# Patient Record
Sex: Male | Born: 1965
Health system: Southern US, Community
[De-identification: ages and names within clinical notes are randomized; demographics above are authoritative.]

## PROBLEM LIST (undated history)

## (undated) DIAGNOSIS — I10 Essential (primary) hypertension: Secondary | ICD-10-CM

---

## 1990-09-21 HISTORY — PX: ELBOW ARTHROPLASTY: SHX928

## 2004-02-09 ENCOUNTER — Emergency Department (HOSPITAL_COMMUNITY): Admission: EM | Admit: 2004-02-09 | Discharge: 2004-02-09 | Payer: Self-pay | Admitting: Emergency Medicine

## 2007-07-06 ENCOUNTER — Encounter: Admission: RE | Admit: 2007-07-06 | Discharge: 2007-07-06 | Payer: Self-pay | Admitting: Internal Medicine

## 2009-04-22 IMAGING — CT CT HEAD WO/W CM
3 series · 17 of 30 positions shown, 19 images · IV contrast (omnipaque)
Comparison: None.

CLINICAL DATA: Left body numbness and weight loss.  
 HEAD CT WITHOUT AND WITH CONTRAST:
TECHNIQUE: Contiguous axial images were obtained from the base of the skull through the vertex according to standard protocol before and after administration of intravenous contrast.
 Contrast:   75 cc of Omnipaque 300.

[Series 3: head w/cm · axial · 0.49mm/px · z∈[+52,+184]mm · 8 of 32 slices shown, 10 images]
[im 4/32  brain]
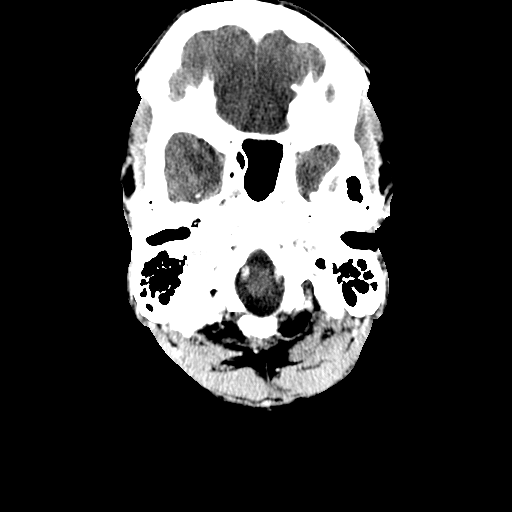
[im 4/32  bone]
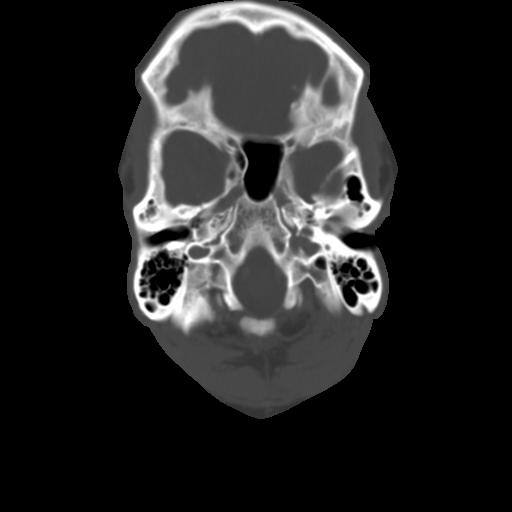
[im 7/32  brain]
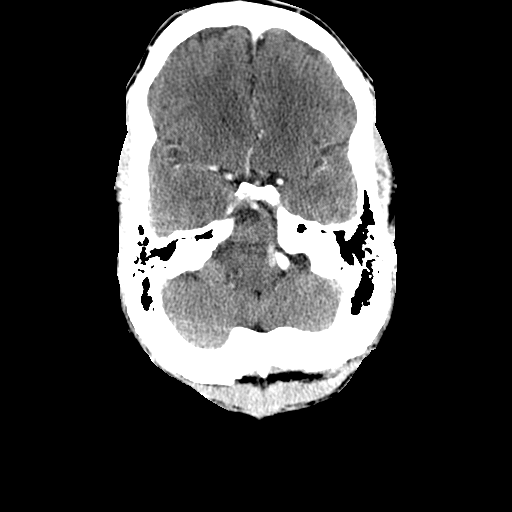
[im 11/32  brain]
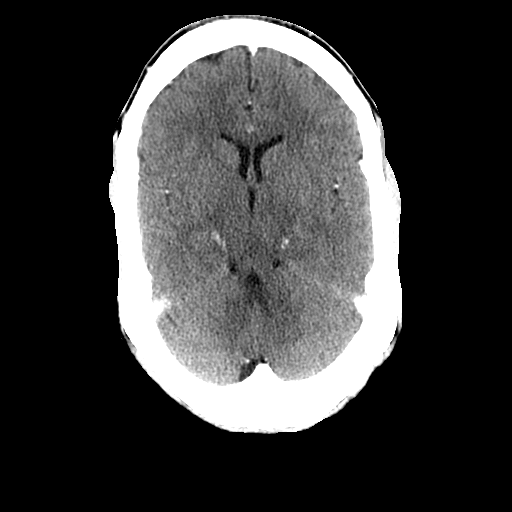
[im 14/32  brain]
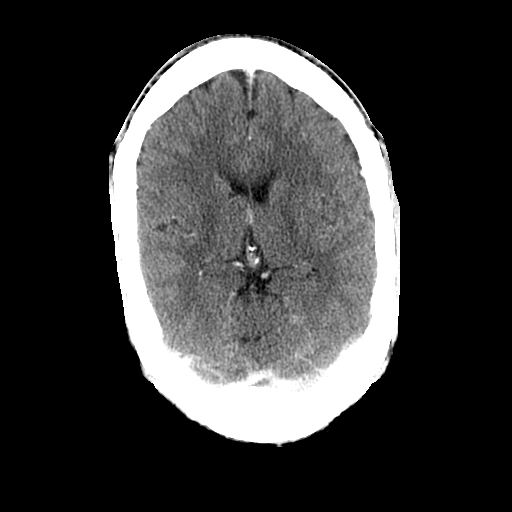
[im 18/32  brain]
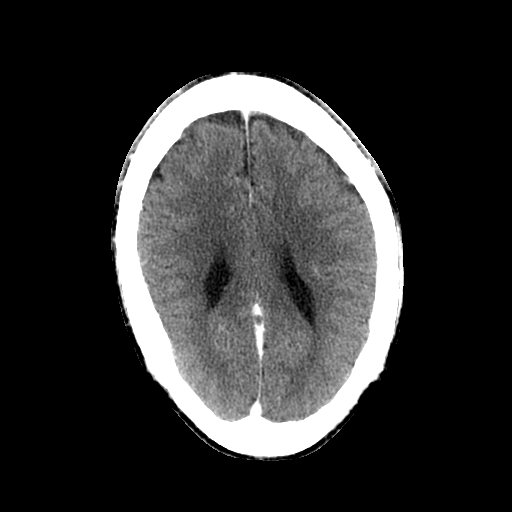
[im 18/32  bone]
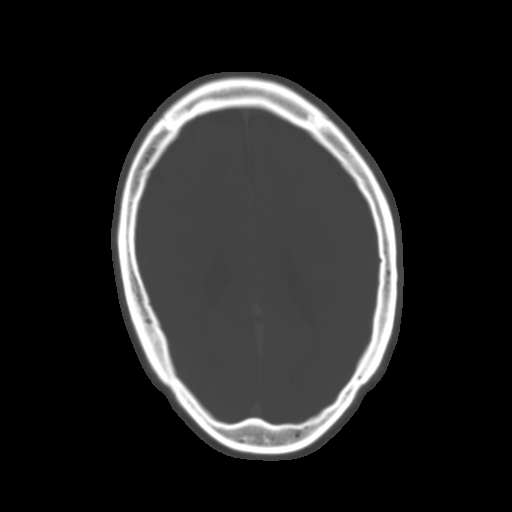
[im 21/32  brain]
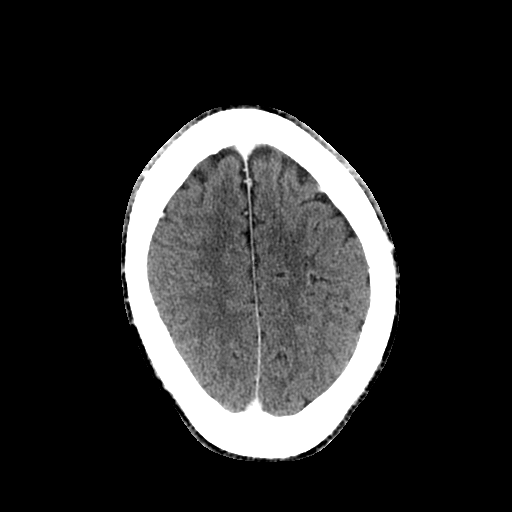
[im 25/32  brain]
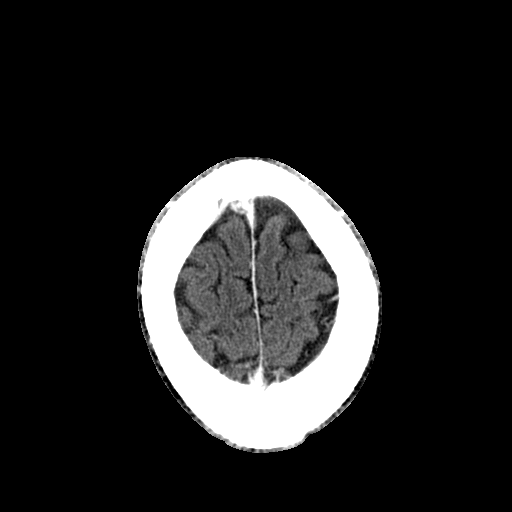
[im 28/32  brain]
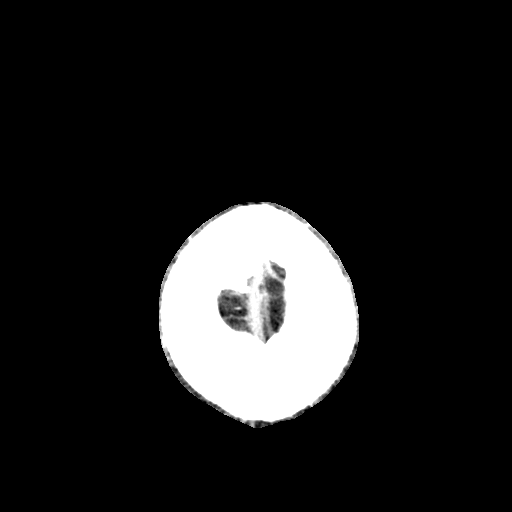

[Series 32: 3d filtered head · axial · 0.49mm/px · z∈[+52,+184]mm · 8 of 32 slices shown]
[im 4/32  brain]
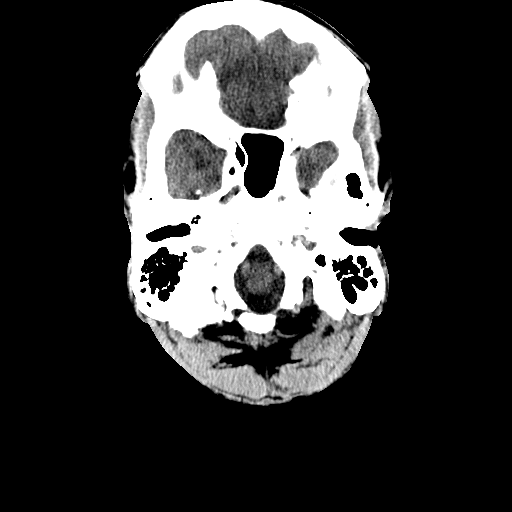
[im 7/32  brain]
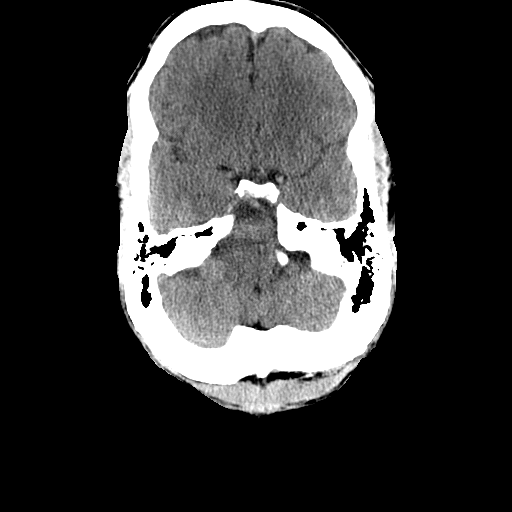
[im 11/32  brain]
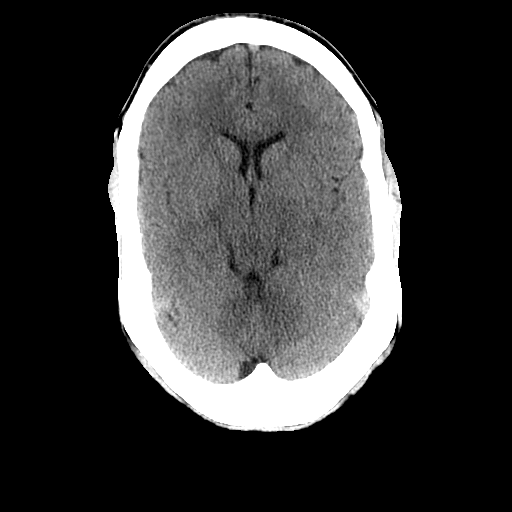
[im 14/32  brain]
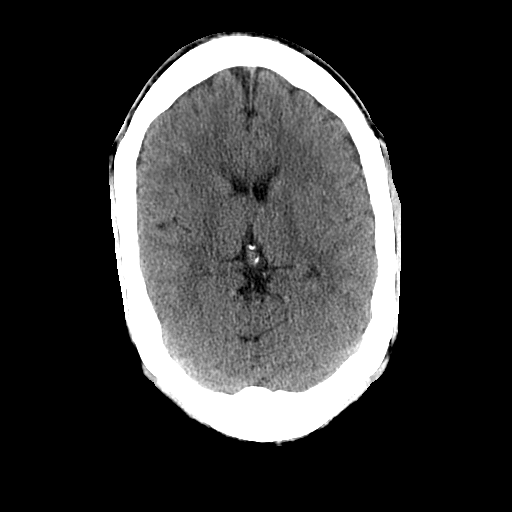
[im 18/32  brain]
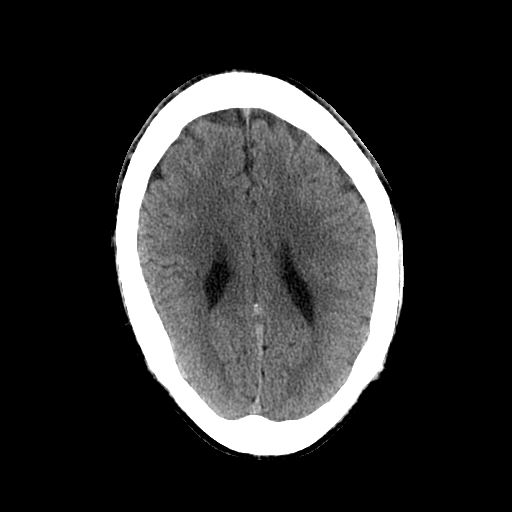
[im 21/32  brain]
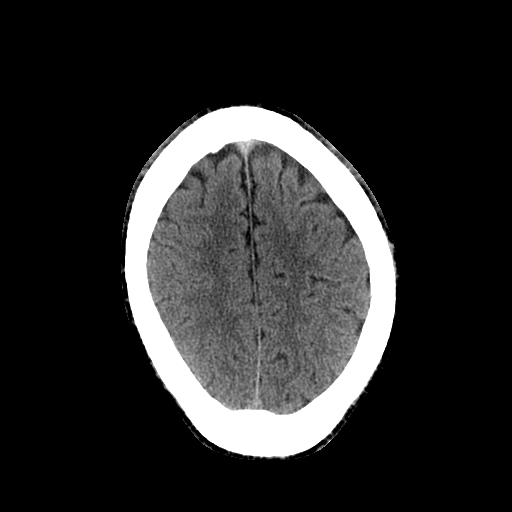
[im 25/32  brain]
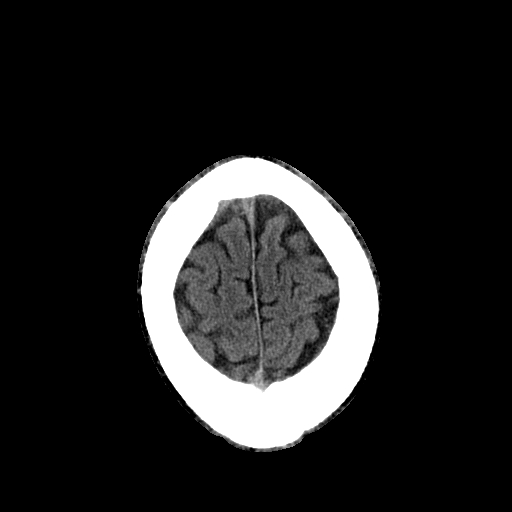
[im 28/32  brain]
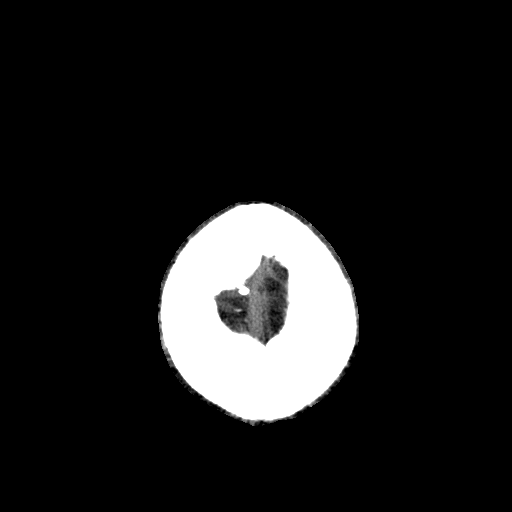

[Series 33: 3d filtered head w/cm · axial · 0.49mm/px · 1 of 32 slices shown]
[im 4/32  brain]
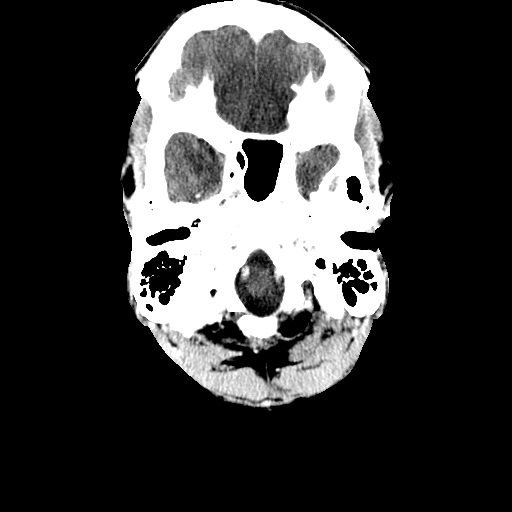

[17 of 30 positions shown; findings below may reference images not displayed]

FINDINGS: Precontrast images demonstrate evidence for acute intracranial abnormality.  Specifically, there is no evidence for acute infarct, hemorrhage, hydrocephalus, or extraaxial fluid collection.  Post contrast images demonstrate no areas of pathologic enhancement.  
 The paranasal sinuses and mastoid air cells are clear.
IMPRESSION: 1.  No acute intracranial abnormality.
 2.  If patient's symptoms persist, MRI may be of use for further evaluation, as clinically indicated.

## 2013-01-17 ENCOUNTER — Other Ambulatory Visit: Payer: Self-pay | Admitting: Orthopedic Surgery

## 2013-03-01 ENCOUNTER — Encounter (HOSPITAL_BASED_OUTPATIENT_CLINIC_OR_DEPARTMENT_OTHER): Payer: Self-pay | Admitting: *Deleted

## 2013-03-01 NOTE — Progress Notes (Signed)
Had surgery on this elbow 1992-dr kuzma did it then-will come in for ekg-bmet-

## 2013-03-03 ENCOUNTER — Encounter (HOSPITAL_BASED_OUTPATIENT_CLINIC_OR_DEPARTMENT_OTHER)
Admission: RE | Admit: 2013-03-03 | Discharge: 2013-03-03 | Disposition: A | Discharge status: Home / Self Care | Payer: Managed Care, Other (non HMO) | Source: Ambulatory Visit | Attending: Orthopedic Surgery | Admitting: Orthopedic Surgery

## 2013-03-03 ENCOUNTER — Other Ambulatory Visit: Payer: Self-pay

## 2013-03-03 LAB — BASIC METABOLIC PANEL
BUN: 15 mg/dL (ref 6–23)
CO2: 29 mEq/L (ref 19–32)
Chloride: 103 mEq/L (ref 96–112)
Creatinine, Ser: 1.44 mg/dL — ABNORMAL HIGH (ref 0.50–1.35)
Glucose, Bld: 110 mg/dL — ABNORMAL HIGH (ref 70–99)
Potassium: 3.4 mEq/L — ABNORMAL LOW (ref 3.5–5.1)

## 2013-03-07 ENCOUNTER — Encounter (HOSPITAL_BASED_OUTPATIENT_CLINIC_OR_DEPARTMENT_OTHER): Payer: Self-pay | Admitting: Orthopedic Surgery

## 2013-03-07 ENCOUNTER — Ambulatory Visit (HOSPITAL_BASED_OUTPATIENT_CLINIC_OR_DEPARTMENT_OTHER): Payer: Managed Care, Other (non HMO) | Admitting: Anesthesiology

## 2013-03-07 ENCOUNTER — Ambulatory Visit (HOSPITAL_BASED_OUTPATIENT_CLINIC_OR_DEPARTMENT_OTHER)
Admission: RE | Admit: 2013-03-07 | Discharge: 2013-03-07 | Disposition: A | Discharge status: Home / Self Care | Payer: Managed Care, Other (non HMO) | Source: Ambulatory Visit | Attending: Orthopedic Surgery | Admitting: Orthopedic Surgery

## 2013-03-07 ENCOUNTER — Encounter (HOSPITAL_BASED_OUTPATIENT_CLINIC_OR_DEPARTMENT_OTHER): Payer: Self-pay | Admitting: Anesthesiology

## 2013-03-07 ENCOUNTER — Encounter (HOSPITAL_BASED_OUTPATIENT_CLINIC_OR_DEPARTMENT_OTHER)
Admission: RE | Disposition: A | Discharge status: Home / Self Care | Payer: Self-pay | Source: Ambulatory Visit | Attending: Orthopedic Surgery

## 2013-03-07 DIAGNOSIS — L905 Scar conditions and fibrosis of skin: Secondary | ICD-10-CM | POA: Insufficient documentation

## 2013-03-07 DIAGNOSIS — T8489XA Other specified complication of internal orthopedic prosthetic devices, implants and grafts, initial encounter: Secondary | ICD-10-CM | POA: Insufficient documentation

## 2013-03-07 DIAGNOSIS — M25529 Pain in unspecified elbow: Secondary | ICD-10-CM | POA: Insufficient documentation

## 2013-03-07 DIAGNOSIS — M24029 Loose body in unspecified elbow: Secondary | ICD-10-CM | POA: Insufficient documentation

## 2013-03-07 DIAGNOSIS — Y831 Surgical operation with implant of artificial internal device as the cause of abnormal reaction of the patient, or of later complication, without mention of misadventure at the time of the procedure: Secondary | ICD-10-CM | POA: Insufficient documentation

## 2013-03-07 HISTORY — DX: Essential (primary) hypertension: I10

## 2013-03-07 HISTORY — PX: ELBOW ARTHROSCOPY: SHX614

## 2013-03-07 HISTORY — PX: EXTREMITY WIRE/PIN REMOVAL: SHX5051

## 2013-03-07 SURGERY — ARTHROSCOPY, ELBOW
Anesthesia: General | Site: Finger | Laterality: Left | Wound class: Clean

## 2013-03-07 MED ORDER — BUPIVACAINE-EPINEPHRINE PF 0.5-1:200000 % IJ SOLN
INTRAMUSCULAR | Status: DC | PRN
  Administered 2013-03-07: 25 mL

## 2013-03-07 MED ORDER — OXYCODONE HCL 5 MG/5ML PO SOLN: 5.0000 mg | Freq: Once | ORAL | Status: DC | PRN

## 2013-03-07 MED ORDER — CHLORHEXIDINE GLUCONATE 4 % EX LIQD: 60.0000 mL | Freq: Once | CUTANEOUS | Status: DC

## 2013-03-07 MED ORDER — OXYCODONE HCL 5 MG PO TABS: 5.0000 mg | ORAL_TABLET | Freq: Once | ORAL | Status: DC | PRN

## 2013-03-07 MED ORDER — DEXAMETHASONE SODIUM PHOSPHATE 4 MG/ML IJ SOLN
INTRAMUSCULAR | Status: DC | PRN
  Administered 2013-03-07: 10 mg via INTRAVENOUS

## 2013-03-07 MED ORDER — ONDANSETRON HCL 4 MG/2ML IJ SOLN: 4.0000 mg | Freq: Once | INTRAMUSCULAR | Status: DC | PRN

## 2013-03-07 MED ORDER — LIDOCAINE HCL (CARDIAC) 20 MG/ML IV SOLN
INTRAVENOUS | Status: DC | PRN
  Administered 2013-03-07: 100 mg via INTRAVENOUS

## 2013-03-07 MED ORDER — SODIUM CHLORIDE 0.9 % IR SOLN
Status: DC | PRN
  Administered 2013-03-07: 2500 mL

## 2013-03-07 MED ORDER — HYDROMORPHONE HCL PF 1 MG/ML IJ SOLN: 0.2500 mg | INTRAMUSCULAR | Status: DC | PRN

## 2013-03-07 MED ORDER — FENTANYL CITRATE 0.05 MG/ML IJ SOLN
INTRAMUSCULAR | Status: DC | PRN
  Administered 2013-03-07: 25 ug via INTRAVENOUS

## 2013-03-07 MED ORDER — CEFAZOLIN SODIUM-DEXTROSE 2-3 GM-% IV SOLR
2.0000 g | INTRAVENOUS | Status: AC
  Administered 2013-03-07: 2 g via INTRAVENOUS

## 2013-03-07 MED ORDER — LACTATED RINGERS IV SOLN
INTRAVENOUS | Status: DC
  Administered 2013-03-07 (×3): via INTRAVENOUS

## 2013-03-07 MED ORDER — LACTATED RINGERS IV SOLN: INTRAVENOUS | Status: DC

## 2013-03-07 MED ORDER — OXYCODONE-ACETAMINOPHEN 10-325 MG PO TABS: 1.0000 | ORAL_TABLET | ORAL | Status: AC | PRN

## 2013-03-07 MED ORDER — FENTANYL CITRATE 0.05 MG/ML IJ SOLN
50.0000 ug | INTRAMUSCULAR | Status: DC | PRN
  Administered 2013-03-07: 100 ug via INTRAVENOUS

## 2013-03-07 MED ORDER — PROPOFOL 10 MG/ML IV BOLUS
INTRAVENOUS | Status: DC | PRN
  Administered 2013-03-07: 250 mg via INTRAVENOUS

## 2013-03-07 MED ORDER — SUCCINYLCHOLINE CHLORIDE 20 MG/ML IJ SOLN
INTRAMUSCULAR | Status: DC | PRN
  Administered 2013-03-07: 100 mg via INTRAVENOUS

## 2013-03-07 MED ORDER — MIDAZOLAM HCL 2 MG/2ML IJ SOLN
1.0000 mg | INTRAMUSCULAR | Status: DC | PRN
  Administered 2013-03-07: 2 mg via INTRAVENOUS

## 2013-03-07 MED ORDER — ONDANSETRON HCL 4 MG/2ML IJ SOLN
INTRAMUSCULAR | Status: DC | PRN
  Administered 2013-03-07: 4 mg via INTRAVENOUS

## 2013-03-07 SURGICAL SUPPLY — 89 items
BANDAGE COBAN STERILE 2 (GAUZE/BANDAGES/DRESSINGS) IMPLANT
BANDAGE GAUZE ELAST BULKY 4 IN (GAUZE/BANDAGES/DRESSINGS) ×3 IMPLANT
BLADE CUTTER GATOR 3.5 (BLADE) IMPLANT
BLADE GREAT WHITE 4.2 (BLADE) IMPLANT
BLADE MINI RND TIP GREEN BEAV (BLADE) ×2 IMPLANT
BLADE SURG 15 STRL LF DISP TIS (BLADE) ×2 IMPLANT
BLADE SURG 15 STRL SS (BLADE) ×6
BNDG CMPR 9X4 STRL LF SNTH (GAUZE/BANDAGES/DRESSINGS) ×2
BNDG COHESIVE 1X5 TAN STRL LF (GAUZE/BANDAGES/DRESSINGS) IMPLANT
BNDG COHESIVE 3X5 TAN STRL LF (GAUZE/BANDAGES/DRESSINGS) ×6 IMPLANT
BNDG ESMARK 4X9 LF (GAUZE/BANDAGES/DRESSINGS) ×2 IMPLANT
BUR CUDA 2.9 (BURR) IMPLANT
BUR FULL RADIUS 2.9 (BURR) ×1 IMPLANT
BUR GATOR 2.9 (BURR) IMPLANT
BUR OVAL 4.0 (BURR) IMPLANT
BUR SPHERICAL 2.9 (BURR) IMPLANT
CANISTER OMNI JUG 16 LITER (MISCELLANEOUS) ×3 IMPLANT
CANISTER SUCTION 2500CC (MISCELLANEOUS) IMPLANT
CHLORAPREP W/TINT 26ML (MISCELLANEOUS) ×3 IMPLANT
CLOTH BEACON ORANGE TIMEOUT ST (SAFETY) ×3 IMPLANT
CORDS BIPOLAR (ELECTRODE) ×3 IMPLANT
COVER MAYO STAND STRL (DRAPES) ×2 IMPLANT
COVER TABLE BACK 60X90 (DRAPES) ×1 IMPLANT
CUFF TOURNIQUET SINGLE 18IN (TOURNIQUET CUFF) ×1 IMPLANT
DECANTER SPIKE VIAL GLASS SM (MISCELLANEOUS) IMPLANT
DRAIN PENROSE 1/2X12 LTX STRL (WOUND CARE) IMPLANT
DRAPE ARTHROSCOPY W/POUCH 114 (DRAPES) ×3 IMPLANT
DRAPE EXTREMITY T 121X128X90 (DRAPE) ×2 IMPLANT
DRAPE OEC MINIVIEW 54X84 (DRAPES) ×2 IMPLANT
DRAPE SURG 17X23 STRL (DRAPES) ×5 IMPLANT
DRSG KUZMA FLUFF (GAUZE/BANDAGES/DRESSINGS) ×2 IMPLANT
ELECT REM PT RETURN 9FT ADLT (ELECTROSURGICAL)
ELECTRODE REM PT RTRN 9FT ADLT (ELECTROSURGICAL) IMPLANT
GAUZE XEROFORM 1X8 LF (GAUZE/BANDAGES/DRESSINGS) ×3 IMPLANT
GLOVE BIO SURGEON STRL SZ 6.5 (GLOVE) ×5 IMPLANT
GLOVE BIO SURGEON STRL SZ7.5 (GLOVE) ×3 IMPLANT
GLOVE BIO SURGEON STRL SZ8 (GLOVE) ×1 IMPLANT
GLOVE BIOGEL PI IND STRL 7.0 (GLOVE) ×3 IMPLANT
GLOVE BIOGEL PI IND STRL 8 (GLOVE) ×2 IMPLANT
GLOVE BIOGEL PI IND STRL 8.5 (GLOVE) ×2 IMPLANT
GLOVE BIOGEL PI INDICATOR 7.0 (GLOVE) ×3
GLOVE BIOGEL PI INDICATOR 8 (GLOVE) ×1
GLOVE BIOGEL PI INDICATOR 8.5 (GLOVE) ×1
GLOVE EPREMIER NITRL EXT CFF L (GLOVE) IMPLANT
GLOVE EXAM NITRILE EXT CFF LRG (GLOVE) ×3 IMPLANT
GLOVE SURG ORTHO 8.0 STRL STRW (GLOVE) ×3 IMPLANT
GOWN BRE IMP PREV XXLGXLNG (GOWN DISPOSABLE) ×4 IMPLANT
GOWN PREVENTION PLUS XLARGE (GOWN DISPOSABLE) ×9 IMPLANT
NDL SAFETY ECLIPSE 18X1.5 (NEEDLE) ×2 IMPLANT
NEEDLE 27GAX1X1/2 (NEEDLE) IMPLANT
NEEDLE HYPO 18GX1.5 SHARP (NEEDLE)
NEEDLE HYPO 22GX1.5 SAFETY (NEEDLE) IMPLANT
NS IRRIG 1000ML POUR BTL (IV SOLUTION) ×1 IMPLANT
PACK ARTHROSCOPY DSU (CUSTOM PROCEDURE TRAY) ×3 IMPLANT
PACK BASIN DAY SURGERY FS (CUSTOM PROCEDURE TRAY) ×3 IMPLANT
PAD CAST 3X4 CTTN HI CHSV (CAST SUPPLIES) ×4 IMPLANT
PADDING CAST ABS 3INX4YD NS (CAST SUPPLIES)
PADDING CAST ABS 4INX4YD NS (CAST SUPPLIES)
PADDING CAST ABS COTTON 3X4 (CAST SUPPLIES) IMPLANT
PADDING CAST ABS COTTON 4X4 ST (CAST SUPPLIES) ×2 IMPLANT
PADDING CAST COTTON 3X4 STRL (CAST SUPPLIES) ×3
PENCIL BUTTON HOLSTER BLD 10FT (ELECTRODE) IMPLANT
RESECTOR FULL RADIUS 4.2MM (BLADE) IMPLANT
SET ARTHROSCOPY TUBING (MISCELLANEOUS) ×3
SET ARTHROSCOPY TUBING LN (MISCELLANEOUS) ×2 IMPLANT
SHEET MEDIUM DRAPE 40X70 STRL (DRAPES) ×1 IMPLANT
SLEEVE SCD COMPRESS KNEE MED (MISCELLANEOUS) ×3 IMPLANT
SLING ARM FOAM STRAP XLG (SOFTGOODS) ×1 IMPLANT
SPLINT FINGER 5/8X3.25 (SOFTGOODS) IMPLANT
SPLINT FINGER FOAM 3 9119 05 (SOFTGOODS) ×3
SPLINT PLASTER CAST XFAST 3X15 (CAST SUPPLIES) ×60 IMPLANT
SPLINT PLASTER XTRA FASTSET 3X (CAST SUPPLIES)
SPONGE GAUZE 4X4 12PLY (GAUZE/BANDAGES/DRESSINGS) ×3 IMPLANT
STOCKINETTE 4X48 STRL (DRAPES) ×3 IMPLANT
SUT ETHILON 5 0 P 3 18 (SUTURE)
SUT ETHILON 5 0 PS 2 18 (SUTURE) IMPLANT
SUT NYLON ETHILON 5-0 P-3 1X18 (SUTURE) IMPLANT
SUT VIC AB 2-0 SH 27 (SUTURE)
SUT VIC AB 2-0 SH 27XBRD (SUTURE) IMPLANT
SUT VIC AB 4-0 P2 18 (SUTURE) IMPLANT
SUT VICRYL RAPID 5 0 P 3 (SUTURE) IMPLANT
SUT VICRYL RAPIDE 4/0 PS 2 (SUTURE) ×4 IMPLANT
SYR BULB 3OZ (MISCELLANEOUS) ×2 IMPLANT
SYR CONTROL 10ML LL (SYRINGE) IMPLANT
TOWEL OR 17X24 6PK STRL BLUE (TOWEL DISPOSABLE) ×6 IMPLANT
TOWEL OR NON WOVEN STRL DISP B (DISPOSABLE) ×1 IMPLANT
UNDERPAD 30X30 INCONTINENT (UNDERPADS AND DIAPERS) ×3 IMPLANT
WAND STAR VAC 90 (SURGICAL WAND) IMPLANT
WATER STERILE IRR 1000ML POUR (IV SOLUTION) ×3 IMPLANT

## 2013-03-07 NOTE — Anesthesia Preprocedure Evaluation (Addendum)

## 2013-03-07 NOTE — Op Note (Signed)
Dictation Number [id number]

## 2013-03-07 NOTE — Transfer of Care (Signed)
Immediate Anesthesia Transfer of Care Note  Patient: [name].  Procedure(s) Performed: Procedure(s): ARTHROSCOPY LEFT ELBOW WITH DEBRIDEMENT AND REMOVAL OF LOOSE BODIES (Left) REMOVAL PINS/WIRE OLECRANON (Left) EXCISION MASS LEFT SMALL FINGER (Left)  Patient Location: PACU  Anesthesia Type:GA combined with regional for post-op pain  Level of Consciousness: sedated  Airway & Oxygen Therapy: Patient Spontanous Breathing and Patient connected to face mask oxygen  Post-op Assessment: Report given to PACU RN and Post -op Vital signs reviewed and stable  Post vital signs: Reviewed and stable  Complications: No apparent anesthesia complications

## 2013-03-07 NOTE — Anesthesia Postprocedure Evaluation (Signed)
  Anesthesia Post-op Note  Patient: [name].  Procedure(s) Performed: Procedure(s): ARTHROSCOPY LEFT ELBOW WITH DEBRIDEMENT AND REMOVAL OF LOOSE BODIES (Left) REMOVAL PINS/WIRE OLECRANON (Left) EXCISION MASS LEFT SMALL FINGER (Left)  Patient Location: PACU  Anesthesia Type:GA combined with regional for post-op pain  Level of Consciousness: sedated  Airway and Oxygen Therapy: Patient Spontanous Breathing and Patient connected to face mask oxygen  Post-op Pain: none  Post-op Assessment: Post-op Vital signs reviewed  Post-op Vital Signs: Reviewed  Complications: No apparent anesthesia complications
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
## 2013-03-07 NOTE — Progress Notes (Signed)
  Assisted Dr. Crews with left, ultrasound guided, supraclavicular block. Side rails up, monitors on throughout procedure. See vital signs in flow sheet. Tolerated Procedure well. 

## 2013-03-07 NOTE — Anesthesia Procedure Notes (Addendum)
Anesthesia Regional Block:  Supraclavicular block  Pre-Anesthetic Checklist: ,, timeout performed, Correct Patient, Correct Site, Correct Laterality, Correct Procedure, Correct Position, site marked, Risks and benefits discussed,  Surgical consent,  Pre-op evaluation,  At surgeon's request and post-op pain management  Laterality: Left and Upper  Prep: chloraprep       Needles:  Injection technique: Single-shot  Needle Type: Echogenic Needle     Needle Length: 5cm 5 cm Needle Gauge: 21    Additional Needles:  Procedures: ultrasound guided (picture in chart) Supraclavicular block Narrative:  Start time: 03/07/2013 9:15 AM End time: 03/07/2013 9:22 AM Injection made incrementally with aspirations every 5 mL.  Performed by: Personally  Anesthesiologist: Sheldon Silvan  Supraclavicular block Procedure Name: Intubation Date/Time: 03/07/2013 10:57 AM Performed by: Gar Gibbon Pre-anesthesia Checklist: Patient identified, Emergency Drugs available, Suction available and Patient being monitored Patient Re-evaluated:Patient Re-evaluated prior to inductionOxygen Delivery Method: Circle System Utilized Preoxygenation: Pre-oxygenation with 100% oxygen Intubation Type: IV induction Ventilation: Mask ventilation without difficulty Laryngoscope Size: Miller and 3 Grade View: Grade III Tube type: Oral Tube size: 8.0 mm Number of attempts: 1 Airway Equipment and Method: stylet and oral airway Placement Confirmation: ETT inserted through vocal cords under direct vision,  positive ETCO2 and breath sounds checked- equal and bilateral Secured at: 24 cm Tube secured with: Tape Dental Injury: Teeth and Oropharynx as per pre-operative assessment

## 2013-03-07 NOTE — Brief Op Note (Signed)
03/07/2013  1:06 PM  PATIENT:  [name].  47 y.o. male  PRE-OPERATIVE DIAGNOSIS:  STATUS/POST  OLECRANON OPEN REDUCTION INTERNAL FIXATION, LOOSE BODIES LEFT ELBOW, MASS LEFT SMALL FINGER  POST-OPERATIVE DIAGNOSIS:  STATUS/POST  OLECRANON OPEN REDUCTION INTERNAL FIXATION, LOOSE BODIES LEFT ELBOW, MASS LEFT SMALL FINGER  PROCEDURE:  Procedure(s): ARTHROSCOPY LEFT ELBOW WITH DEBRIDEMENT AND REMOVAL OF LOOSE BODIES (Left) REMOVAL PINS/WIRE OLECRANON (Left) EXCISION MASS LEFT SMALL FINGER (Left)  SURGEON:  Surgeon(s) and Role:    * Nicki Reaper, MD - Primary    * Tami Ribas, MD - Assisting  PHYSICIAN ASSISTANT:   ASSISTANTS: Karlyn Agee, MD   ANESTHESIA:   regional and general  EBL:  Total I/O In: 1000 [I.V.:1000] Out: -   BLOOD ADMINISTERED:none  DRAINS: none   LOCAL MEDICATIONS USED:  NONE  SPECIMEN:  Excision  DISPOSITION OF SPECIMEN:  PATHOLOGY  COUNTS:  YES  TOURNIQUET:   Total Tourniquet Time Documented: Upper Arm (Left) - 36 minutes Total: Upper Arm (Left) - 36 minutes   DICTATION: .Other Dictation: Dictation Number [id number]  PLAN OF CARE: Discharge to home after PACU  PATIENT DISPOSITION:  PACU - hemodynamically stable.

## 2013-03-08 ENCOUNTER — Encounter (HOSPITAL_BASED_OUTPATIENT_CLINIC_OR_DEPARTMENT_OTHER): Payer: Self-pay | Admitting: Orthopedic Surgery
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
## 2014-12-19 ENCOUNTER — Other Ambulatory Visit: Payer: Self-pay | Admitting: Internal Medicine

## 2014-12-19 DIAGNOSIS — N183 Chronic kidney disease, stage 3 unspecified: Secondary | ICD-10-CM

## 2014-12-25 ENCOUNTER — Ambulatory Visit
Admission: RE | Admit: 2014-12-25 | Discharge: 2014-12-25 | Disposition: A | Discharge status: Home / Self Care | Payer: Managed Care, Other (non HMO) | Source: Ambulatory Visit | Attending: Internal Medicine | Admitting: Internal Medicine

## 2014-12-25 DIAGNOSIS — N183 Chronic kidney disease, stage 3 unspecified: Secondary | ICD-10-CM

## 2016-10-11 IMAGING — US US RENAL
1 series · 14 of 25 positions shown · non-contrast
Comparison: None.

CLINICAL DATA: Stage 3 chronic kidney disease

EXAM:
RENAL/URINARY TRACT ULTRASOUND COMPLETE

[Series 1: us renal · 0.28mm/px · 14 of 33 slices shown]
[im 1/33]
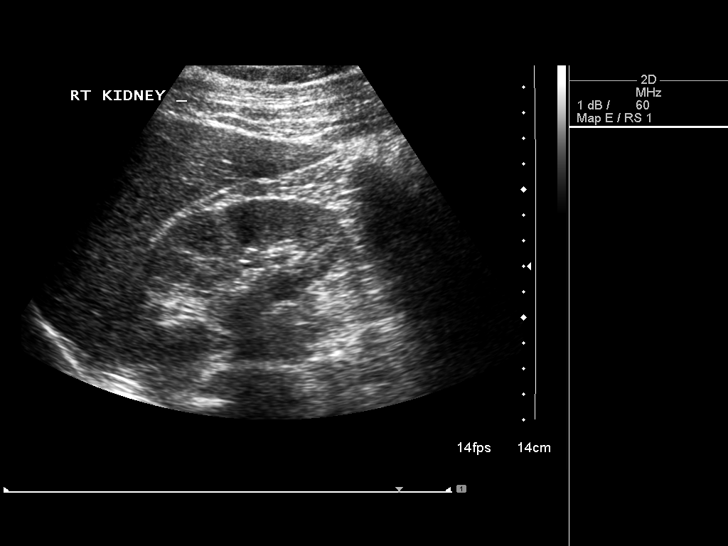
[im 3/33]
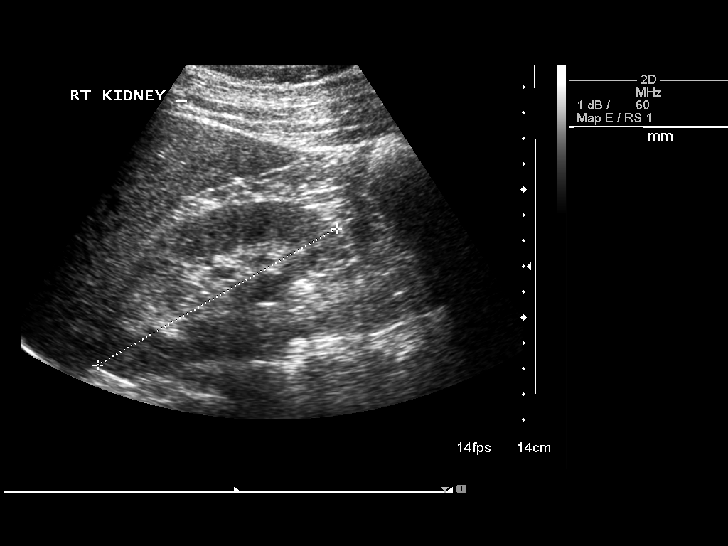
[im 6/33]
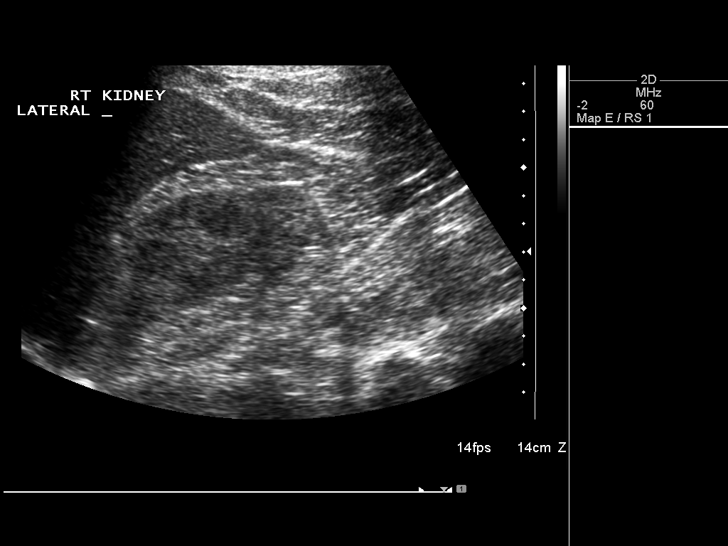
[im 9/33]
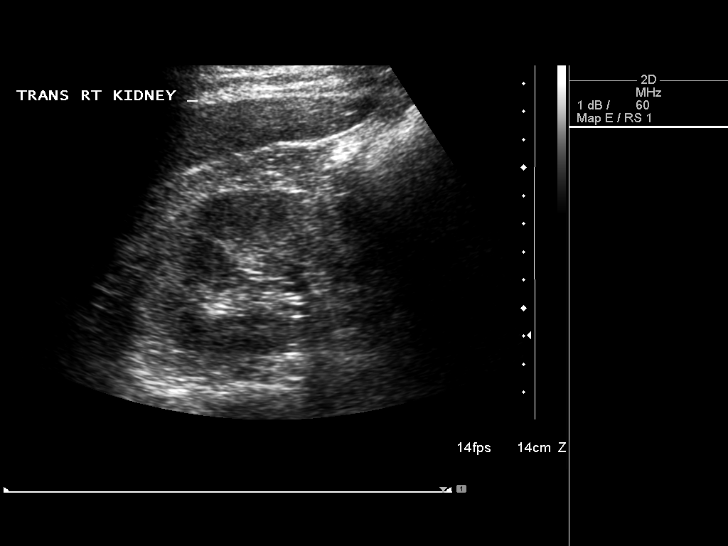
[im 11/33]
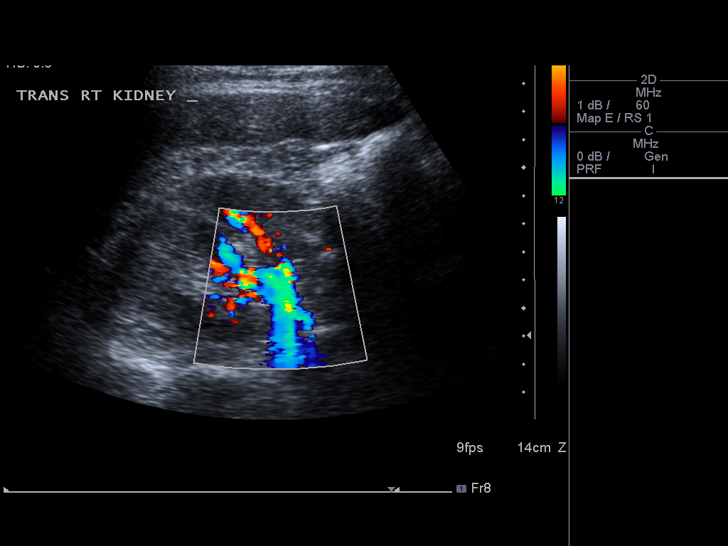
[im 13/33]
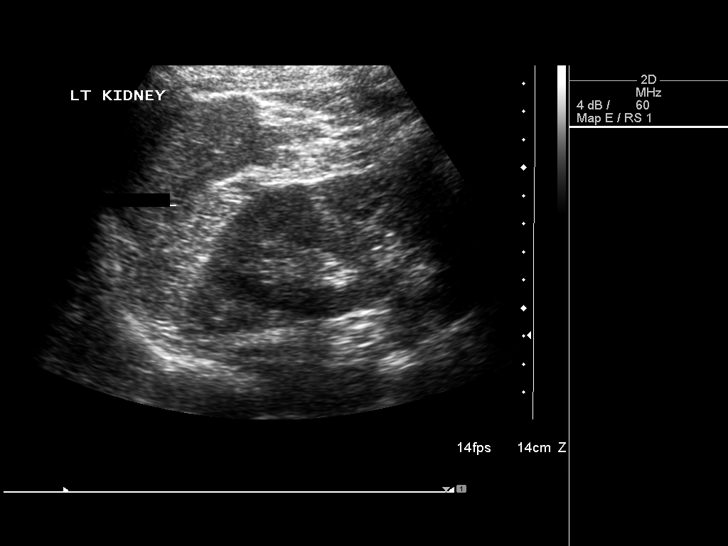
[im 15/33]
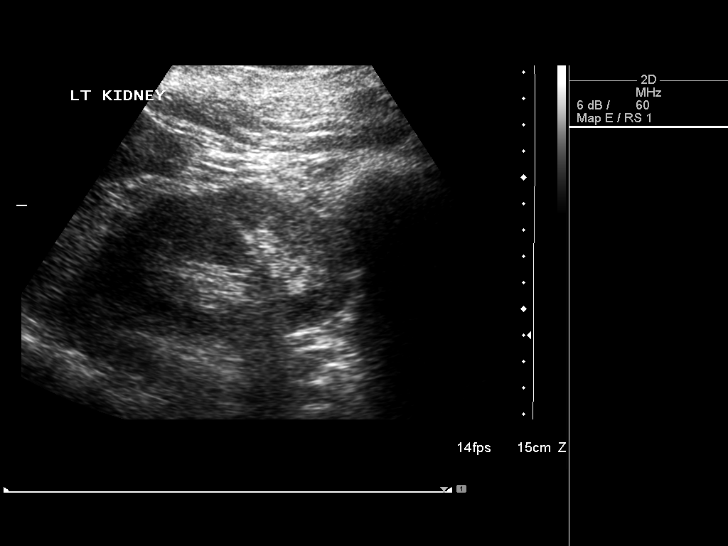
[im 18/33]
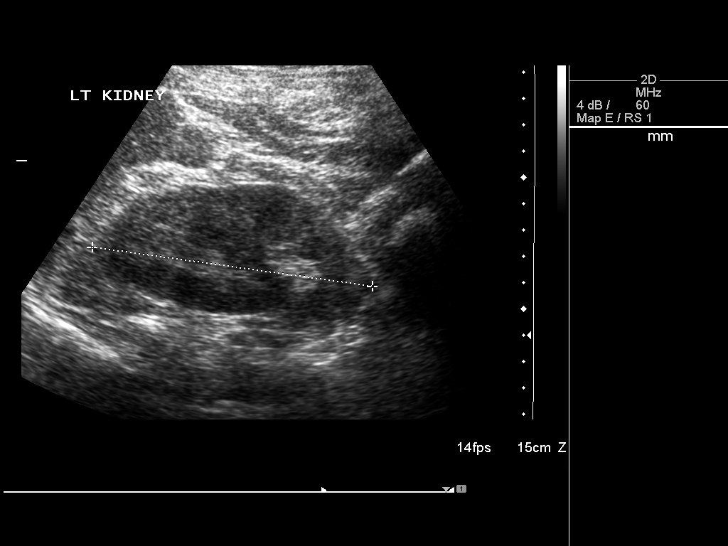
[im 21/33]
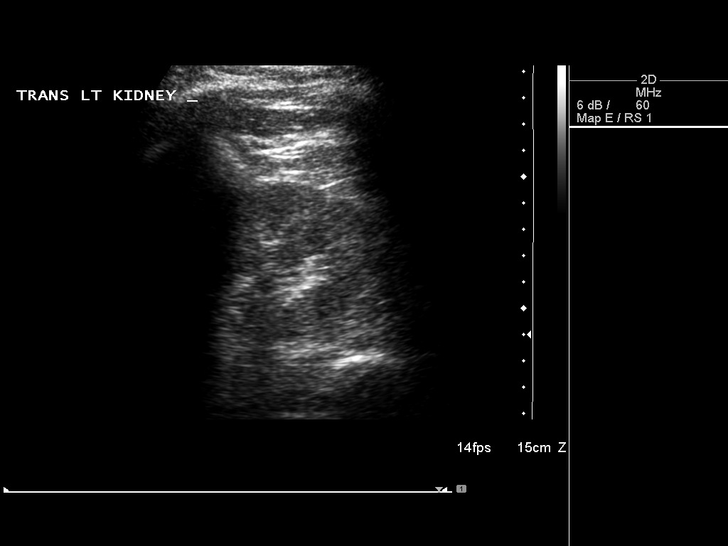
[im 22/33]
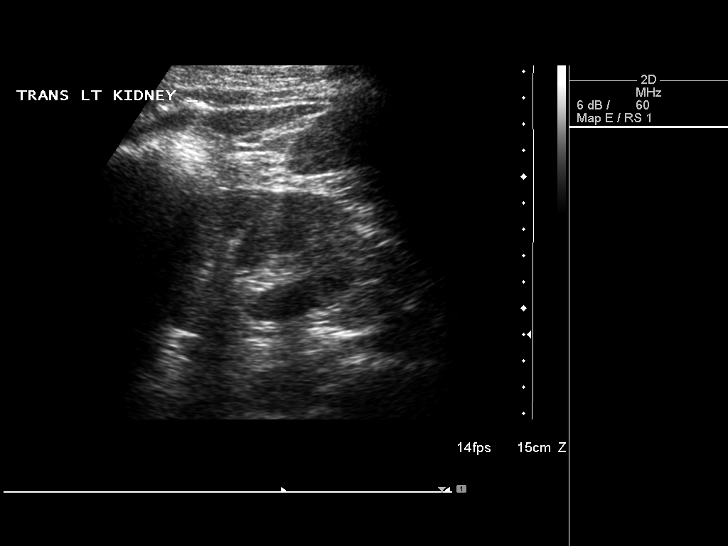
[im 25/33]
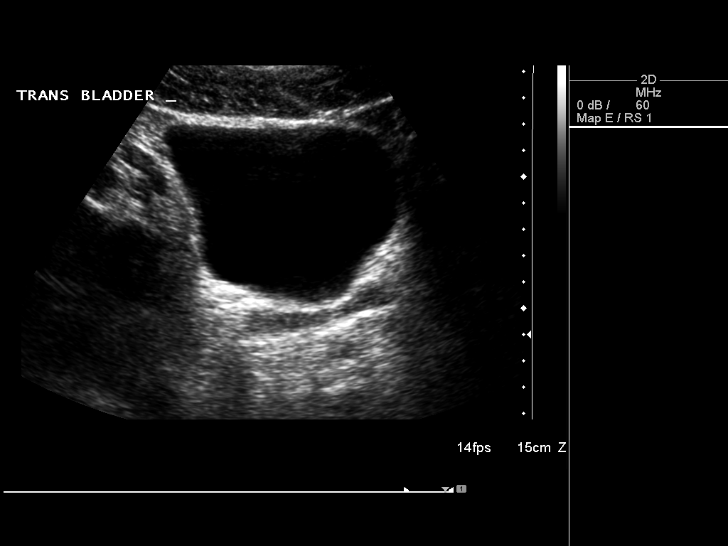
[im 27/33]
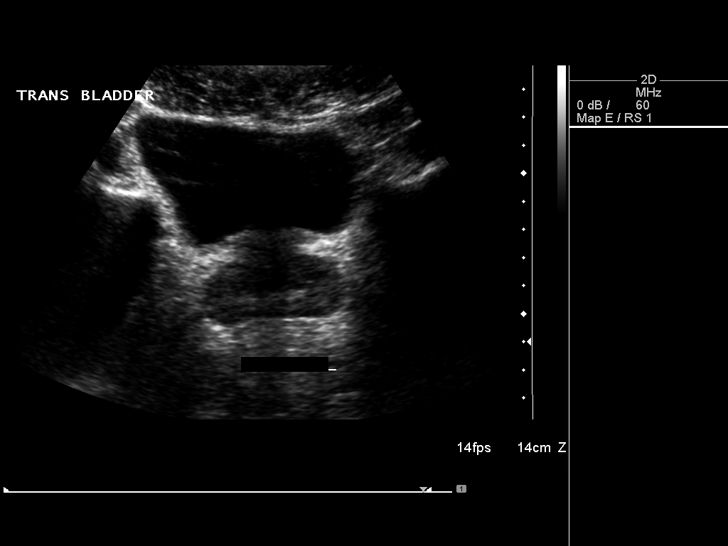
[im 30/33]
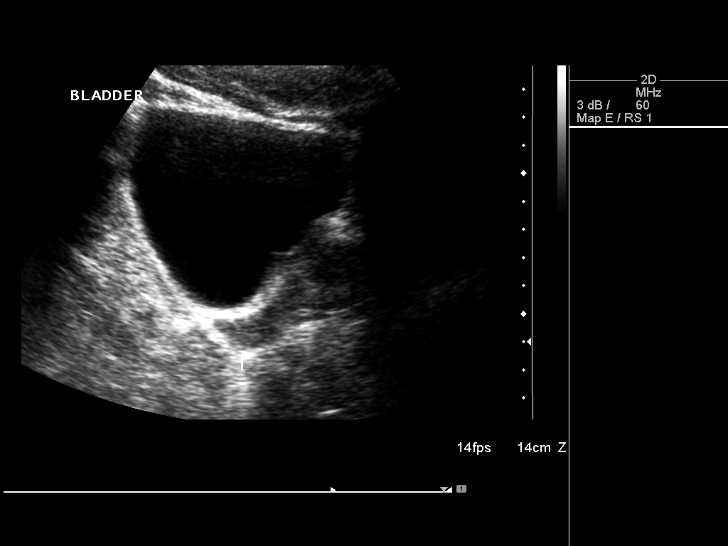
[im 33/33]
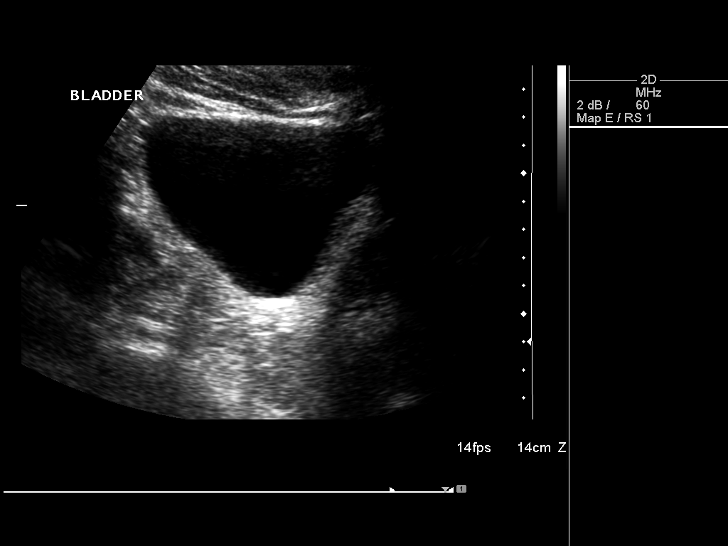

[14 of 25 positions shown; findings below may reference images not displayed]

FINDINGS: Right Kidney:

Length: 10.8 cm. Echogenicity within normal limits. No mass or
hydronephrosis visualized.

Left Kidney:

Length: 10.8 cm. Echogenicity within normal limits. No mass or
hydronephrosis visualized.

Bladder:

The bladder appears normal.

The prostate is prominent at 5.1 cm. The prostate causes impression
upon the bladder base. There is some nodularity to the interface
between the bladder in the prostate.
IMPRESSION: The kidneys are normal. Correlate appearance of the prostate with
PSA levels.

## 2017-07-30 ENCOUNTER — Ambulatory Visit: Payer: Self-pay | Admitting: Podiatry

## 2017-08-23 DIAGNOSIS — I10 Essential (primary) hypertension: Secondary | ICD-10-CM | POA: Diagnosis not present

## 2017-08-23 DIAGNOSIS — Z23 Encounter for immunization: Secondary | ICD-10-CM | POA: Diagnosis not present

## 2017-08-23 DIAGNOSIS — B351 Tinea unguium: Secondary | ICD-10-CM | POA: Diagnosis not present

## 2017-08-23 DIAGNOSIS — M79674 Pain in right toe(s): Secondary | ICD-10-CM | POA: Diagnosis not present

## 2017-08-30 ENCOUNTER — Ambulatory Visit: Payer: BLUE CROSS/BLUE SHIELD | Admitting: Podiatry

## 2017-09-02 ENCOUNTER — Ambulatory Visit: Payer: BLUE CROSS/BLUE SHIELD

## 2017-09-02 ENCOUNTER — Ambulatory Visit (INDEPENDENT_AMBULATORY_CARE_PROVIDER_SITE_OTHER): Payer: BLUE CROSS/BLUE SHIELD | Admitting: Podiatry

## 2017-09-02 ENCOUNTER — Ambulatory Visit (INDEPENDENT_AMBULATORY_CARE_PROVIDER_SITE_OTHER): Payer: BLUE CROSS/BLUE SHIELD

## 2017-09-02 DIAGNOSIS — Z79899 Other long term (current) drug therapy: Secondary | ICD-10-CM | POA: Diagnosis not present

## 2017-09-02 DIAGNOSIS — L84 Corns and callosities: Secondary | ICD-10-CM

## 2017-09-02 DIAGNOSIS — B351 Tinea unguium: Secondary | ICD-10-CM | POA: Diagnosis not present

## 2017-09-02 MED ORDER — TERBINAFINE HCL 250 MG PO TABS: 250.0000 mg | ORAL_TABLET | Freq: Every day | ORAL | 0 refills | Status: AC

## 2017-09-02 NOTE — Patient Instructions (Signed)
DO NOT START THE MEDICATION UNTIL I CALL YOU WITH THE RESULTS OF THE BLOOD WORK  --------  Terbinafine oral granules What is this medicine? TERBINAFINE (TER bin a feen) is an antifungal medicine. It is used to treat certain kinds of fungal or yeast infections. This medicine may be used for other purposes; ask your health care provider or pharmacist if you have questions. COMMON BRAND NAME(S): Lamisil What should I tell my health care provider before I take this medicine? They need to know if you have any of these conditions: -drink alcoholic beverages -kidney disease -liver disease -an unusual or allergic reaction to Terbinafine, other medicines, foods, dyes, or preservatives -pregnant or trying to get pregnant -breast-feeding How should I use this medicine? Take this medicine by mouth. Follow the directions on the prescription label. Hold packet with cut line on top. Shake packet gently to settle contents. Tear packet open along cut line, or use scissors to cut across line. Carefully pour the entire contents of packet onto a spoonful of a soft food, such as pudding or other soft, non-acidic food such as mashed potatoes (do NOT use applesauce or a fruit-based food). If two packets are required for each dose, you may either sprinkle the content of both packets on one spoonful of non-acidic food, or sprinkle the contents of both packets on two spoonfuls of non-acidic food. Make sure that no granules remain in the packet. Swallow the mxiture of the food and granules without chewing. Take your medicine at regular intervals. Do not take it more often than directed. Take all of your medicine as directed even if you think you are better. Do not skip doses or stop your medicine early. Contact your pediatrician or health care professional regarding the use of this medicine in children. While this medicine may be prescribed for children as young as 4 years for selected conditions, precautions do  apply. Overdosage: If you think you have taken too much of this medicine contact a poison control center or emergency room at once. NOTE: This medicine is only for you. Do not share this medicine with others. What if I miss a dose? If you miss a dose, take it as soon as you can. If it is almost time for your next dose, take only that dose. Do not take double or extra doses. What may interact with this medicine? Do not take this medicine with any of the following medications: -thioridazine This medicine may also interact with the following medications: -beta-blockers -caffeine -cimetidine -cyclosporine -MAOIs like Carbex, Eldepryl, Marplan, Nardil, and Parnate -medicines for fungal infections like fluconazole and ketoconazole -medicines for irregular heartbeat like amiodarone, flecainide and propafenone -rifampin -SSRIs like citalopram, escitalopram, fluoxetine, fluvoxamine, paroxetine and sertraline -tricyclic antidepressants like amitriptyline, clomipramine, desipramine, imipramine, nortriptyline, and others -warfarin This list may not describe all possible interactions. Give your health care provider a list of all the medicines, herbs, non-prescription drugs, or dietary supplements you use. Also tell them if you smoke, drink alcohol, or use illegal drugs. Some items may interact with your medicine. What should I watch for while using this medicine? Your doctor may monitor your liver function. Tell your doctor right away if you have nausea or vomiting, loss of appetite, stomach pain on your right upper side, yellow skin, dark urine, light stools, or are over tired. You need to take this medicine for 6 weeks or longer to cure the fungal infection. Take your medicine regularly for as long as your doctor or health care professional  tells you to. What side effects may I notice from receiving this medicine? Side effects that you should report to your doctor or health care professional as soon as  possible: -allergic reactions like skin rash or hives, swelling of the face, lips, or tongue -change in vision -dark urine -fever or infection -general ill feeling or flu-like symptoms -light-colored stools -loss of appetite, nausea -redness, blistering, peeling or loosening of the skin, including inside the mouth -right upper belly pain -unusually weak or tired -yellowing of the eyes or skin Side effects that usually do not require medical attention (report to your doctor or health care professional if they continue or are bothersome): -changes in taste -diarrhea -hair loss -muscle or joint pain -stomach upset This list may not describe all possible side effects. Call your doctor for medical advice about side effects. You may report side effects to FDA at 1-800-FDA-1088. Where should I keep my medicine? Keep out of the reach of children. Store at room temperature between 15 and 30 degrees C (59 and 86 degrees F). Throw away any unused medicine after the expiration date. NOTE: This sheet is a summary. It may not cover all possible information. If you have questions about this medicine, talk to your doctor, pharmacist, or health care provider.  2018 Elsevier/Gold Standard (2007-11-18 17:25:48)

## 2017-09-06 ENCOUNTER — Telehealth: Payer: Self-pay | Admitting: *Deleted

## 2017-09-06 NOTE — Telephone Encounter (Signed)
Dr. Ardelle AntonWagoner states pt's blood work 09/03/2017 from Dr. Kirby [facility]John [facility] shows kidney function to be borderline and he recommends pt hold off on the oral terbinabine, begin topical. Unable to leave a message for pt to call for results, no voicemail available.

## 2017-09-07 ENCOUNTER — Telehealth: Payer: Self-pay | Admitting: *Deleted

## 2017-09-07 NOTE — Telephone Encounter (Signed)
Tried to call the patient to let him know about the results and could not leave a message. Misty StanleyLisa

## 2017-09-07 NOTE — Telephone Encounter (Signed)
Called and spoke with the patient and stated that his kidney function was borderline and per Dr Ardelle AntonWagoner [facility] and not to take the oral medication and to call the [place] office for any concerns or questions. Misty StanleyLisa

## 2017-09-07 NOTE — Progress Notes (Signed)
Subjective:   Patient ID: Robert BartersLloyd T Mountz Jr., male   DOB: 51 y.o.   MRN: [id number]   HPI [name] presents the office today for concerns of thick, discolored toenails all of his nails which is been ongoing for several years.  He states the nails do not have any drainage or redness, from the area with occasional get painful with shoes.  Also states he has numerous calluses to his feet.  He does stand all day when closed in shoes.  No recent treatment for these issues.  He has no other concerns.   Review of Systems  All other systems reviewed and are negative.  Past Medical History:  Diagnosis Date  . Hypertension     Past Surgical History:  Procedure Laterality Date  . ELBOW ARTHROPLASTY  1992   left-dr kuzma did surgery  . ELBOW ARTHROSCOPY Left 03/07/2013   Procedure: ARTHROSCOPY LEFT ELBOW WITH DEBRIDEMENT AND REMOVAL OF LOOSE BODIES;  Surgeon: Nicki ReaperGary R Kuzma, MD;  Location: [facility];  Service: Orthopedics;  Laterality: Left;  . EXTREMITY WIRE/PIN REMOVAL Left 03/07/2013   Procedure: REMOVAL PINS/WIRE OLECRANON;  Surgeon: Nicki ReaperGary R Kuzma, MD;  Location: [facility];  Service: Orthopedics;  Laterality: Left;     Current Outpatient Medications:  .  losartan-hydrochlorothiazide (HYZAAR) 50-12.5 MG per tablet, Take 1 tablet by mouth daily., Disp: , Rfl:  .  oxyCODONE-acetaminophen (PERCOCET) 10-325 MG per tablet, Take 1 tablet by mouth every 4 (four) hours as needed for pain., Disp: 30 tablet, Rfl: 0 .  terbinafine (LAMISIL) 250 MG tablet, Take 1 tablet (250 mg total) by mouth daily., Disp: 90 tablet, Rfl: 0  No Known Allergies  Social History   Socioeconomic History  . Marital status: Married    Spouse name: Not on file  . Number of children: Not on file  . Years of education: Not on file  . Highest education level: Not on file  Social Needs  . Financial resource strain: Not on file  . Food insecurity - worry: Not on file  . Food insecurity -  inability: Not on file  . Transportation needs - medical: Not on file  . Transportation needs - non-medical: Not on file  Occupational History  . Not on file  Tobacco Use  . Smoking status: Never Smoker  Substance and Sexual Activity  . Alcohol use: No    Comment: never  . Drug use: No  . Sexual activity: Not on file  Other Topics Concern  . Not on file  Social History Narrative  . Not on file         Objective:  Physical Exam   General: AAO x3, NAD  Dermatological: Nails are hypertrophic, dystrophic, brittle, discolored, elongated 10. No surrounding redness or drainage.  The nails of yellow to brown discoloration in particular the right hallux toenail is dystrophic and likely more from damage.  Hyperkeratotic lesions bilateral some metatarsal one medial hallux.  No underlying ulceration, drainage or any signs of infection noted.  No open lesions or pre-ulcerative lesions are identified today.  Vascular: Dorsalis Pedis artery and Posterior Tibial artery pedal pulses are 2/4 bilateral with immedate capillary fill time. Pedal hair growth present. No varicosities and no lower extremity edema present bilateral. There is no pain with calf compression, swelling, warmth, erythema.   Neruologic: Grossly intact via light touch bilateral.  Protective threshold with Semmes Wienstein monofilament intact to all pedal sites bilateral.   Musculoskeletal: No gross boney pedal deformities  bilateral. No pain, crepitus, or limitation noted with foot and ankle range of motion bilateral. Muscular strength 5/5 in all groups tested bilateral.  Gait: Unassisted, Nonantalgic.       Assessment:   Onychomycosis/onychodystrophy; hyperkeratotic lesions      Plan:  -Treatment options discussed including all alternatives, risks, and complications -Etiology of symptoms were discussed -I sharply debrided the nails x10 without any complications or bleeding.  I discussed the treatment options for  onychomycosis and given the thickening of the nails I do think he would benefit more from oral therapy, Lamisil.  We discussed medication as well as potential side effects.  I did order this medication but I want to check blood work before we start the medication.  I ordered a CBC, CMP.  Monitor his liver function and kidney function is adequate before starting therapy.  If we cannot do oral medication will do a topical antifungal through [facility].  -sharply debrided the hyperkeratotic lesions x4 without any complications or bleeding.  Discussed shoe gear modifications and inserts. Moisturizer daily.   Vivi BarrackMatthew R Margareta Laureano DPM

## 2017-09-08 ENCOUNTER — Telehealth: Payer: Self-pay | Admitting: *Deleted

## 2017-09-08 NOTE — Telephone Encounter (Signed)
The patient's kidney function was borderline and per Dr Ardelle AntonWagoner I sent a RX for [facility] and did the onychomycosis nail lacquer. [name]

## 2017-10-19 ENCOUNTER — Ambulatory Visit: Payer: Managed Care, Other (non HMO) | Admitting: Podiatry

## 2017-10-19 DIAGNOSIS — B351 Tinea unguium: Secondary | ICD-10-CM | POA: Diagnosis not present

## 2017-10-19 MED ORDER — CICLOPIROX 8 % EX SOLN: Freq: Every day | CUTANEOUS | 2 refills | Status: AC

## 2017-10-20 NOTE — Progress Notes (Signed)
Subjective: 52 year old male presents the office today for follow-up evaluation of nail fungus.  He states that he is doing well.  He did not get the compound cream to be called in for him and we did not do the oral medicine due to his kidney function.  He denies any swelling or redness to his toenails.  He has no other concerns today.  No pain to the nails or any other areas of his feet.  No swelling. Denies any systemic complaints such as fevers, chills, nausea, vomiting. No acute changes since last appointment, and no other complaints at this time.   Objective: AAO x3, NAD DP/PT pulses palpable bilaterally, CRT less than 3 seconds Nails continue be hypertrophic, dystrophic, discolored with yellow-brown discoloration.  No pain to the nails there is no surrounding redness or drainage.  Mild interdigital maceration present.  Subjectively his skin does not itch.  No open sores. No open lesions or pre-ulcerative lesions.  No pain with calf compression, swelling, warmth, erythema  Assessment: Onychomycosis, interdigital maceration  Plan: -All treatment options discussed with the patient including all alternatives, risks, complications.  -I did debride the nails today x10 without any complications or bleeding after verbal consent was obtained.  We discussed treatment options for nail fungus.  I ordered Penlac.  Cast application instructions as well as side effects and success rates.  I applied Castellani's paint interdigitally.  Discussed with a dry thoroughly between his toes.  Cast with him changing shoes and socks on a regular basis given his job and his feet constantly getting wet. -Follow-up in 3 months or sooner if needed.  Call any questions or concerns.  He agrees this plan. -Patient encouraged to call the office with any questions, concerns, change in symptoms.   Vivi BarrackMatthew R Haedyn Breau DPM

## 2018-01-18 ENCOUNTER — Ambulatory Visit: Payer: Managed Care, Other (non HMO) | Admitting: Podiatry

## 2018-10-10 DIAGNOSIS — I10 Essential (primary) hypertension: Secondary | ICD-10-CM | POA: Diagnosis not present

## 2018-10-10 DIAGNOSIS — Z23 Encounter for immunization: Secondary | ICD-10-CM | POA: Diagnosis not present

## 2019-06-09 DIAGNOSIS — I10 Essential (primary) hypertension: Secondary | ICD-10-CM | POA: Diagnosis not present

## 2019-06-09 DIAGNOSIS — Z0000 Encounter for general adult medical examination without abnormal findings: Secondary | ICD-10-CM | POA: Diagnosis not present

## 2019-08-04 ENCOUNTER — Other Ambulatory Visit: Payer: Self-pay

## 2019-08-04 DIAGNOSIS — Z20822 Contact with and (suspected) exposure to covid-19: Secondary | ICD-10-CM

## 2019-08-07 LAB — NOVEL CORONAVIRUS, NAA: SARS-CoV-2, NAA: DETECTED — AB

## 2020-06-10 DIAGNOSIS — Z0000 Encounter for general adult medical examination without abnormal findings: Secondary | ICD-10-CM | POA: Diagnosis not present

## 2020-06-10 DIAGNOSIS — Z23 Encounter for immunization: Secondary | ICD-10-CM | POA: Diagnosis not present

## 2020-06-10 DIAGNOSIS — Z1159 Encounter for screening for other viral diseases: Secondary | ICD-10-CM | POA: Diagnosis not present

## 2020-06-10 DIAGNOSIS — N529 Male erectile dysfunction, unspecified: Secondary | ICD-10-CM | POA: Diagnosis not present

## 2020-06-10 DIAGNOSIS — Z125 Encounter for screening for malignant neoplasm of prostate: Secondary | ICD-10-CM | POA: Diagnosis not present

## 2020-06-10 DIAGNOSIS — I10 Essential (primary) hypertension: Secondary | ICD-10-CM | POA: Diagnosis not present

## 2021-02-19 ENCOUNTER — Ambulatory Visit
Admission: EM | Admit: 2021-02-19 | Discharge: 2021-02-19 | Disposition: A | Discharge status: Home / Self Care | Payer: BC Managed Care – PPO | Attending: Emergency Medicine | Admitting: Emergency Medicine

## 2021-02-19 ENCOUNTER — Other Ambulatory Visit: Payer: Self-pay

## 2021-02-19 DIAGNOSIS — H6123 Impacted cerumen, bilateral: Secondary | ICD-10-CM

## 2021-02-19 NOTE — ED Provider Notes (Signed)
[facility]    CSN: [id number] Arrival date & time: 02/19/21  0844      History   Chief Complaint Chief Complaint  Patient presents with  . Ear Fullness    HPI [name]. is a 55 y.o. male history of hypertension presenting today with bilateral ear fullness.  Reports that over the past 4 to 5 days has had muffled hearing and decreased hearing bilaterally.  Denies associated pain using over-the-counter earwax solution without relief.  HPI  Past Medical History:  Diagnosis Date  . Hypertension     There are no problems to display for this patient.   Past Surgical History:  Procedure Laterality Date  . ELBOW ARTHROPLASTY  1992   left-dr kuzma did surgery  . ELBOW ARTHROSCOPY Left 03/07/2013   Procedure: ARTHROSCOPY LEFT ELBOW WITH DEBRIDEMENT AND REMOVAL OF LOOSE BODIES;  Surgeon: Nicki Reaper, MD;  Location: [facility];  Service: Orthopedics;  Laterality: Left;  . EXTREMITY WIRE/PIN REMOVAL Left 03/07/2013   Procedure: REMOVAL PINS/WIRE OLECRANON;  Surgeon: Nicki Reaper, MD;  Location: [facility];  Service: Orthopedics;  Laterality: Left;       Home Medications    Prior to Admission medications   Medication Sig Start Date End Date Taking? Authorizing Provider  ciclopirox (PENLAC) 8 % solution Apply topically at bedtime. Apply over nail and surrounding skin. Apply daily over previous coat. After seven (7) days, may remove with alcohol and continue cycle. 10/19/17   Vivi Barrack, DPM  losartan-hydrochlorothiazide (HYZAAR) 50-12.5 MG per tablet Take 1 tablet by mouth daily.    [provider]  NON FORMULARY [facility]  Onychomycosis Nail Lacquer -  Fluconazole 2%, Terbinafine 1% DMSO Apply to affected nail once daily Qty. 120 gm 3 refills    [provider]  oxyCODONE-acetaminophen (PERCOCET) 10-325 MG per tablet Take 1 tablet by mouth every 4 (four) hours as needed for pain. 03/07/13    Cindee Salt, MD  terbinafine (LAMISIL) 250 MG tablet Take 1 tablet (250 mg total) by mouth daily. 09/02/17   Vivi Barrack, DPM    Family History History reviewed. No pertinent family history.  Social History Social History   Tobacco Use  . Smoking status: Never Smoker  . Smokeless tobacco: Never Used  Substance Use Topics  . Alcohol use: No    Comment: never  . Drug use: No     Allergies   Patient has no known allergies.   Review of Systems Review of Systems  Constitutional: Negative for activity change, appetite change, chills, fatigue and fever.  HENT: Positive for hearing loss. Negative for congestion, ear pain, rhinorrhea, sinus pressure, sore throat and trouble swallowing.   Eyes: Negative for discharge and redness.  Respiratory: Negative for cough, chest tightness and shortness of breath.   Cardiovascular: Negative for chest pain.  Gastrointestinal: Negative for abdominal pain, diarrhea, nausea and vomiting.  Musculoskeletal: Negative for myalgias.  Skin: Negative for rash.  Neurological: Negative for dizziness, light-headedness and headaches.     Physical Exam Triage Vital Signs ED Triage Vitals  Enc Vitals Group     BP 02/19/21 0945 (S) (!) 144/90     Pulse Rate 02/19/21 0945 61     Resp 02/19/21 0945 16     Temp 02/19/21 0945 97.8 F (36.6 C)     Temp Source 02/19/21 0945 Oral     SpO2 02/19/21 0945 97 %     Weight --  Height --      Head Circumference --      Peak Flow --      Pain Score 02/19/21 1105 0     Pain Loc --      Pain Edu? --      Excl. in GC? --    No data found.  Updated Vital Signs BP (S) (!) 144/90 (BP Location: Left Arm)   Pulse 61   Temp 97.8 F (36.6 C) (Oral)   Resp 16   SpO2 97%   Visual Acuity Right Eye Distance:   Left Eye Distance:   Bilateral Distance:    Right Eye Near:   Left Eye Near:    Bilateral Near:     Physical Exam Vitals and nursing note reviewed.  Constitutional:      Appearance: He  is well-developed.     Comments: No acute distress  HENT:     Head: Normocephalic and atraumatic.     Ears:     Comments: Bilateral cerumen impaction, after removal, improvement in hearing, canals without swelling or edema, TMs intact with good bony landmarks    Nose: Nose normal.  Eyes:     Conjunctiva/sclera: Conjunctivae normal.  Cardiovascular:     Rate and Rhythm: Normal rate.  Pulmonary:     Effort: Pulmonary effort is normal. No respiratory distress.  Abdominal:     General: There is no distension.  Musculoskeletal:        General: Normal range of motion.     Cervical back: Neck supple.  Skin:    General: Skin is warm and dry.  Neurological:     Mental Status: He is alert and oriented to person, place, and time.      UC Treatments / Results  Labs (all labs ordered are listed, but only abnormal results are displayed) Labs Reviewed - No data to display  EKG   Radiology No results found.  Procedures Procedures (including critical care time)  Medications Ordered in UC Medications - No data to display  Initial Impression / Assessment and Plan / UC Course  I have reviewed the triage vital signs and the nursing notes.  Pertinent labs & imaging results that were available during my care of the patient were reviewed by me and considered in my medical decision making (see chart for details).     Cerumen impaction removal by irrigation by nursing staff, improvement in hearing, no further signs of underlying otitis media/externa,Discussed strict return precautions. Patient verbalized understanding and is agreeable with plan.  Final Clinical Impressions(s) / UC Diagnoses   Final diagnoses:  Bilateral hearing loss due to cerumen impaction   Discharge Instructions   None    ED Prescriptions    None     PDMP not reviewed this encounter.   Lew Dawes, PA-C 02/19/21 1124

## 2021-02-19 NOTE — ED Triage Notes (Signed)
Patient presents to Urgent Care with complaints of bilateral ear fullness since sat. Pt states he has been using ear wax solution with no relief.

## 2021-04-16 DIAGNOSIS — R6883 Chills (without fever): Secondary | ICD-10-CM | POA: Diagnosis not present

## 2021-04-16 DIAGNOSIS — U071 COVID-19: Secondary | ICD-10-CM | POA: Diagnosis not present

## 2021-06-11 DIAGNOSIS — Z0000 Encounter for general adult medical examination without abnormal findings: Secondary | ICD-10-CM | POA: Diagnosis not present

## 2021-06-11 DIAGNOSIS — Z23 Encounter for immunization: Secondary | ICD-10-CM | POA: Diagnosis not present

## 2021-06-11 DIAGNOSIS — N529 Male erectile dysfunction, unspecified: Secondary | ICD-10-CM | POA: Diagnosis not present

## 2021-06-11 DIAGNOSIS — I10 Essential (primary) hypertension: Secondary | ICD-10-CM | POA: Diagnosis not present

## 2021-06-11 DIAGNOSIS — Z13220 Encounter for screening for lipoid disorders: Secondary | ICD-10-CM | POA: Diagnosis not present

## 2022-06-16 DIAGNOSIS — Z0000 Encounter for general adult medical examination without abnormal findings: Secondary | ICD-10-CM | POA: Diagnosis not present

## 2022-06-16 DIAGNOSIS — I10 Essential (primary) hypertension: Secondary | ICD-10-CM | POA: Diagnosis not present

## 2022-06-16 DIAGNOSIS — N529 Male erectile dysfunction, unspecified: Secondary | ICD-10-CM | POA: Diagnosis not present

## 2022-06-16 DIAGNOSIS — Z125 Encounter for screening for malignant neoplasm of prostate: Secondary | ICD-10-CM | POA: Diagnosis not present

## 2022-07-10 DIAGNOSIS — R7989 Other specified abnormal findings of blood chemistry: Secondary | ICD-10-CM | POA: Diagnosis not present

## 2022-07-10 DIAGNOSIS — I10 Essential (primary) hypertension: Secondary | ICD-10-CM | POA: Diagnosis not present

## 2022-07-10 DIAGNOSIS — Z23 Encounter for immunization: Secondary | ICD-10-CM | POA: Diagnosis not present

## 2022-10-12 DIAGNOSIS — Z23 Encounter for immunization: Secondary | ICD-10-CM | POA: Diagnosis not present

## 2022-10-12 DIAGNOSIS — I10 Essential (primary) hypertension: Secondary | ICD-10-CM | POA: Diagnosis not present

## 2022-10-12 DIAGNOSIS — R7989 Other specified abnormal findings of blood chemistry: Secondary | ICD-10-CM | POA: Diagnosis not present

## 2022-10-12 DIAGNOSIS — R972 Elevated prostate specific antigen [PSA]: Secondary | ICD-10-CM | POA: Diagnosis not present

## 2022-12-07 DIAGNOSIS — R972 Elevated prostate specific antigen [PSA]: Secondary | ICD-10-CM | POA: Diagnosis not present

## 2023-05-24 ENCOUNTER — Encounter (HOSPITAL_BASED_OUTPATIENT_CLINIC_OR_DEPARTMENT_OTHER): Payer: Self-pay | Admitting: Emergency Medicine

## 2023-05-24 ENCOUNTER — Emergency Department (HOSPITAL_BASED_OUTPATIENT_CLINIC_OR_DEPARTMENT_OTHER): Payer: Managed Care, Other (non HMO)

## 2023-05-24 ENCOUNTER — Emergency Department (HOSPITAL_BASED_OUTPATIENT_CLINIC_OR_DEPARTMENT_OTHER)
Admission: EM | Admit: 2023-05-24 | Discharge: 2023-05-24 | Disposition: A | Discharge status: Home / Self Care | Payer: Managed Care, Other (non HMO) | Attending: Emergency Medicine | Admitting: Emergency Medicine

## 2023-05-24 ENCOUNTER — Ambulatory Visit
Admission: EM | Admit: 2023-05-24 | Discharge: 2023-05-24 | Disposition: A | Discharge status: Home / Self Care | Payer: Managed Care, Other (non HMO)

## 2023-05-24 ENCOUNTER — Other Ambulatory Visit: Payer: Self-pay

## 2023-05-24 DIAGNOSIS — R234 Changes in skin texture: Secondary | ICD-10-CM | POA: Insufficient documentation

## 2023-05-24 DIAGNOSIS — M79671 Pain in right foot: Secondary | ICD-10-CM | POA: Insufficient documentation

## 2023-05-24 DIAGNOSIS — S91301A Unspecified open wound, right foot, initial encounter: Secondary | ICD-10-CM

## 2023-05-24 MED ORDER — DOXYCYCLINE HYCLATE 100 MG PO CAPS: 100.0000 mg | ORAL_CAPSULE | Freq: Two times a day (BID) | ORAL | 0 refills | Status: AC

## 2023-05-24 MED ORDER — MEDIHONEY WOUND/BURN DRESSING EX PSTE: 1.0000 | PASTE | Freq: Every day | CUTANEOUS | 0 refills | Status: AC

## 2023-05-24 NOTE — Discharge Instructions (Addendum)
You have a foot wound with necrosis which means the skin has died.  This may be due to a bug bite or spider bite or another cause of skin breakdown.  This requires attention from a follow-up wound care clinic.  A referral was placed for wound care clinic.  Please confirm your appointment with them by calling their office tomorrow.  You should try to keep your foot out of shoes is much as possible, to relieve pressure on the heel.  You can change the bandaging once a day, and I recommend applying the Medihoney to the wound at night.  If there is redness and warmth spreading down your foot or up your leg from the wound site, please return to the hospital.  This may be signs of a worsening infection.  If you begin having fevers or chills or worsening pain please return to the hospital as well.

## 2023-05-24 NOTE — ED Triage Notes (Addendum)
Pt arrives to ED with c/o right foot wound. He notes pain over the past 6 days. Pt states x6 days ago he felt a pulling sensation in his left heel where wearing boots at work. Now pt reports black discoloration to heel. Sent by UC for necrotic ulcer, spider bite possibility.

## 2023-05-24 NOTE — ED Notes (Signed)
Patient is being discharged from the Urgent Care and sent to the Emergency Department via private vehicle . Per Laren Everts NP, patient is in need of higher level of care due to open wound right heel. Patient is aware and verbalizes understanding of plan of care.  Vitals:   05/24/23 0843 05/24/23 0845  BP:  (!) 143/80  Pulse: 68   Resp: 16   Temp: 98.4 F (36.9 C)   SpO2: 97%

## 2023-05-24 NOTE — ED Notes (Signed)
Mepilex applied to R heel, post op shoe applied as well. Discharge instructions, prescriptions, and follow up care with wound care reviewed and explained. Pt verbalized understanding and had no further questions on d/c. Pt caox4, ambulatory, NAD on d/c.

## 2023-05-24 NOTE — Discharge Instructions (Signed)
Please go to the emergency department as soon as you leave urgent care for further evaluation and management. ?

## 2023-05-24 NOTE — ED Provider Notes (Signed)
[facility]    CSN: [id number] Arrival date & time: 05/24/23  1324      History   Chief Complaint Chief Complaint  Patient presents with   Foot Injury    HPI Raymie Penta. is a 57 y.o. male.   Patient presents with a wound to his right heel that he noticed about 6 days ago.  Patient states that he was wearing his work boots when he felt a "pinch in his boot".  He took his boot off noting a black discoloration to the area.  Denies any obvious injury, insect bite, spider bite.  Denies that he has a history of diabetes.  Denies that this has ever occurred previously. Reports it is minimally painful.  He has had clear drainage from the area. Denies fever.    Foot Injury   Past Medical History:  Diagnosis Date   Hypertension     There are no problems to display for this patient.   Past Surgical History:  Procedure Laterality Date   ELBOW ARTHROPLASTY  1992   left-dr kuzma did surgery   ELBOW ARTHROSCOPY Left 03/07/2013   Procedure: ARTHROSCOPY LEFT ELBOW WITH DEBRIDEMENT AND REMOVAL OF LOOSE BODIES;  Surgeon: Nicki Reaper, MD;  Location: [facility];  Service: Orthopedics;  Laterality: Left;   EXTREMITY WIRE/PIN REMOVAL Left 03/07/2013   Procedure: REMOVAL PINS/WIRE OLECRANON;  Surgeon: Nicki Reaper, MD;  Location: [facility];  Service: Orthopedics;  Laterality: Left;       Home Medications    Prior to Admission medications   Medication Sig Start Date End Date Taking? Authorizing Provider  ciclopirox (PENLAC) 8 % solution Apply topically at bedtime. Apply over nail and surrounding skin. Apply daily over previous coat. After seven (7) days, may remove with alcohol and continue cycle. 10/19/17   Vivi Barrack, DPM  losartan-hydrochlorothiazide (HYZAAR) 50-12.5 MG per tablet Take 1 tablet by mouth daily.    [provider]  NON [facility]ULARY [facility]  Onychomycosis Nail Lacquer -  Fluconazole 2%, Terbinafine  1% DMSO Apply to affected nail once daily Qty. 120 gm 3 refills    [provider]  oxyCODONE-acetaminophen (PERCOCET) 10-325 MG per tablet Take 1 tablet by mouth every 4 (four) hours as needed for pain. 03/07/13   Cindee Salt, MD  terbinafine (LAMISIL) 250 MG tablet Take 1 tablet (250 mg total) by mouth daily. 09/02/17   Vivi Barrack, DPM    Family History History reviewed. No pertinent family history.  Social History Social History   Tobacco Use   Smoking status: Never   Smokeless tobacco: Never  Substance Use Topics   Alcohol use: No    Comment: never   Drug use: No     Allergies   Patient has no known allergies.   Review of Systems Review of Systems Per HPI  Physical Exam Triage Vital Signs ED Triage Vitals  Encounter Vitals Group     BP 05/24/23 0845 (!) 143/80     Systolic BP Percentile --      Diastolic BP Percentile --      Pulse Rate 05/24/23 0843 68     Resp 05/24/23 0843 16     Temp 05/24/23 0843 98.4 F (36.9 C)     Temp src --      SpO2 05/24/23 0843 97 %     Weight --      Height --      Head Circumference --  Peak Flow --      Pain Score 05/24/23 0844 6     Pain Loc --      Pain Education --      Exclude from Growth Chart --    No data found.  Updated Vital Signs BP (!) 143/80 (BP Location: Left Arm)   Pulse 68   Temp 98.4 F (36.9 C)   Resp 16   SpO2 97%   Visual Acuity Right Eye Distance:   Left Eye Distance:   Bilateral Distance:    Right Eye Near:   Left Eye Near:    Bilateral Near:     Physical Exam Constitutional:      General: He is not in acute distress.    Appearance: Normal appearance. He is not toxic-appearing or diaphoretic.  HENT:     Head: Normocephalic and atraumatic.  Eyes:     Extraocular Movements: Extraocular movements intact.     Conjunctiva/sclera: Conjunctivae normal.  Pulmonary:     Effort: Pulmonary effort is normal.  Feet:     Comments: Patient has an area of open wound  present to posterior heel that has a black discoloration.  Another similar area that is smaller directly adjacent to this that extends slightly into the plantar surface of the foot.  No obvious drainage noted.  Mild swelling and erythema surrounding.  Patient appears to be neurovascularly intact. Neurological:     General: No focal deficit present.     Mental Status: He is alert and oriented to person, place, and time. Mental status is at baseline.  Psychiatric:        Mood and Affect: Mood normal.        Behavior: Behavior normal.        Thought Content: Thought content normal.        Judgment: Judgment normal.      UC Treatments / Results  Labs (all labs ordered are listed, but only abnormal results are displayed) Labs Reviewed - No data to display  EKG   Radiology No results found.  Procedures Procedures (including critical care time)  Medications Ordered in UC Medications - No data to display  Initial Impression / Assessment and Plan / UC Course  I have reviewed the triage vital signs and the nursing notes.  Pertinent labs & imaging results that were available during my care of the patient were reviewed by me and considered in my medical decision making (see chart for details).     I am concerned with the appearance of this wound especially with the black discoloration as it may be concerning for necrosis.  Differential diagnoses include foot ulcer versus spider bite.  Also mildly concerned for cellulitis to this area.  Given appearance, do think that more extensive evaluation with possible imaging is necessary to rule out any form of necrosis versus osteomyelitis.  Patient was advised to go to the emergency department for further evaluation and he was agreeable with this plan.  Vital signs and patient stable at discharge.  Agree with patient self transport to the ER. Final Clinical Impressions(s) / UC Diagnoses   Final diagnoses:  Open wound of right foot with  complication, initial encounter     Discharge Instructions      Please go to the emergency department as soon as you leave urgent care for further evaluation and management.    ED Prescriptions   None    PDMP not reviewed this encounter.   Gustavus Bryant, [place] 05/24/23 [phone]

## 2023-05-24 NOTE — ED Triage Notes (Signed)
Pt states open wound to right heel for the past 6 days states it started out as a black blister that has now opened up. Open wound noted no drainage.

## 2023-05-24 NOTE — ED Provider Notes (Signed)
[place] EMERGENCY DEPARTMENT AT [facility] Provider Note   CSN: [id number] Arrival date & time: 05/24/23  8413     History  Chief Complaint  Patient presents with   Foot Pain    [name]. is a 57 y.o. male presenting to the ED with complaint of right foot pain.  Patient reports that 5 to 6 days ago at work when he was wearing his boots he felt a pinch in a pool near his right heel.  Later that night when he took off his socks he noted he had an area of dark necrosis and ulceration on his right heel.  He denies history of diabetes or neuropathy.  He did not note any insects in his boots.  He was seen in urgent care today and referred into ED for further evaluation.  He denies fevers or chills.  HPI     Home Medications Prior to Admission medications   Medication Sig Start Date End Date Taking? Authorizing Provider  doxycycline (VIBRAMYCIN) 100 MG capsule Take 1 capsule (100 mg total) by mouth 2 (two) times daily for 7 days. 05/24/23 05/31/23 Yes Lyvonne Cassell, Kermit Balo, MD  leptospermum manuka honey (MEDIHONEY) PSTE paste Apply 1 Application topically daily. 05/24/23 06/23/23 Yes Ayza Ripoll, Kermit Balo, MD  ciclopirox [facility]) 8 % solution Apply topically at bedtime. Apply over nail and surrounding skin. Apply daily over previous coat. After seven (7) days, may remove with alcohol and continue cycle. 10/19/17   Vivi Barrack, DPM  losartan-hydrochlorothiazide (HYZAAR) 50-12.5 MG per tablet Take 1 tablet by mouth daily.    [provider]  NON FORMULARY [facility]  Onychomycosis Nail Lacquer -  Fluconazole 2%, Terbinafine 1% DMSO Apply to affected nail once daily Qty. 120 gm 3 refills    [provider]  oxyCODONE-acetaminophen (PERCOCET) 10-325 MG per tablet Take 1 tablet by mouth every 4 (four) hours as needed for pain. 03/07/13   Cindee Salt, MD  terbinafine (LAMISIL) 250 MG tablet Take 1 tablet (250 mg total) by mouth daily. 09/02/17   Vivi Barrack, DPM      Allergies    Patient has no known allergies.    Review of Systems   Review of Systems  Physical Exam Updated Vital Signs BP (!) 152/106 (BP Location: Right Arm)   Pulse 67   Temp 97.8 F (36.6 C) (Temporal)   Resp 18   Ht 6\' 1"  (1.854 m)   Wt 92.5 kg   SpO2 96%   BMI 26.91 kg/m  Physical Exam Constitutional:      General: He is not in acute distress. HENT:     Head: Normocephalic and atraumatic.  Eyes:     Conjunctiva/sclera: Conjunctivae normal.     Pupils: Pupils are equal, round, and reactive to light.  Cardiovascular:     Rate and Rhythm: Normal rate and regular rhythm.  Pulmonary:     Effort: Pulmonary effort is normal. No respiratory distress.  Abdominal:     General: There is no distension.     Tenderness: There is no abdominal tenderness.  Skin:    General: Skin is warm and dry.     Comments: Circular necrosis of the right heel, see photo  Neurological:     General: No focal deficit present.     Mental Status: He is alert. Mental status is at baseline.  Psychiatric:        Mood and Affect: Mood normal.  Behavior: Behavior normal.         ED Results / Procedures / Treatments   Labs (all labs ordered are listed, but only abnormal results are displayed) Labs Reviewed - No data to display  EKG None  Radiology CT Foot Right Wo Contrast  Result Date: 05/24/2023 CLINICAL DATA:  Open wound of right heel EXAM: CT OF THE RIGHT FOOT WITHOUT CONTRAST TECHNIQUE: Multidetector CT imaging of the right foot was performed according to the standard protocol. Multiplanar CT image reconstructions were also generated. RADIATION DOSE REDUCTION: This exam was performed according to the departmental dose-optimization program which includes automated exposure control, adjustment of the mA and/or kV according to patient size and/or use of iterative reconstruction technique. COMPARISON:  X-ray 09/02/2017 FINDINGS: Bones/Joint/Cartilage No acute  fracture. No dislocation. No bone erosion or periosteal elevation. Mild hallux valgus deformity. Multipartite tibial hallux sesamoid. Chronic fragmented osteophyte at the dorso-lateral aspect of the first metatarsal head. Mild osteoarthritic changes of the first MTP joint and hallux sesamoid complex. Beaking of the dorsal talar head with chronic fragmentation. No tibiotalar or subtalar joint effusion is evident. Ligaments Suboptimally assessed by CT. Muscles and Tendons No acute musculotendinous abnormality by CT. No appreciable tenosynovial fluid collection. Soft tissues Shallow soft tissue wound at the posterior aspect of the right heel. No underlying fluid collection. No soft tissue gas. IMPRESSION: 1. Shallow soft tissue wound at the posterior aspect of the right heel. No underlying fluid collection. No soft tissue gas. 2. No CT evidence of osteomyelitis. 3. Mild hallux valgus deformity with mild osteoarthritic changes of the first MTP joint and hallux sesamoid complex. Electronically Signed   By: Duanne Guess D.O.   On: 05/24/2023 11:31    Procedures Procedures    Medications Ordered in ED Medications - No data to display  ED Course/ Medical Decision Making/ A&P                                 Medical Decision Making Amount and/or Complexity of Data Reviewed Radiology: ordered.  Risk Prescription drug management.   Patient is presenting to the ED with concern for necrosis region of the right posterior heel.  To me there is no evidence of surrounding cellulitis at this time.  He does not have any systemic symptoms of infection.  CT imaging of the heel was ordered and personally viewed interpreted, notable for no emergent findings.  I recommend that he follow-up with the wound care clinic will place an ambulatory referral.  We also discussed wound care management at home.  A work note was provided.  Recommend that he try to avoid wearing compressive shoes is much as possible to avoid  continued pressure to this region.  He verbalized understanding.  At this point there is no indication for hospitalization for IV antibiotics.          Final Clinical Impression(s) / ED Diagnoses Final diagnoses:  Wound eschar of foot    Rx / DC Orders ED Discharge Orders          Ordered    doxycycline (VIBRAMYCIN) 100 MG capsule  2 times daily        05/24/23 1232    leptospermum manuka honey (MEDIHONEY) PSTE paste  Daily        05/24/23 1232    AMB referral to wound care center       Comments: New necrotic wound of right heel, potentially insect  bite - may require debridement   05/24/23 1233              Terald Sleeper, MD 05/24/23 1239

## 2023-05-31 ENCOUNTER — Encounter: Payer: Managed Care, Other (non HMO) | Attending: Physician Assistant | Admitting: Physician Assistant

## 2023-05-31 DIAGNOSIS — L89610 Pressure ulcer of right heel, unstageable: Secondary | ICD-10-CM | POA: Insufficient documentation

## 2023-05-31 DIAGNOSIS — I10 Essential (primary) hypertension: Secondary | ICD-10-CM | POA: Insufficient documentation

## 2023-05-31 NOTE — Progress Notes (Signed)
[name] ([id number]) [id number] Page 1 of 10 Visit Report for 05/31/2023 Allergy List Details Patient Name: Date of Service: [name]. 05/31/2023 2:00 PM Medical Record Number: [id number] Patient Account Number: [id number] Date of Birth/Sex: Treating RN: [birth year] (57 y.o. [name] Primary Care Aradhana Gin: Eleanora Neighbor Other Clinician: Referring Rosalea Withrow: Treating Saiya Crist/Extender: Scheryl Marten, MA [name]: 0 Allergies Active Allergies No Known Allergies Allergy Notes Electronic Signature(s) Signed: 05/31/2023 5:10:46 PM By: Midge Aver MSN RN CNS WTA Entered By: Midge Aver on 05/31/2023 11:27:18 -------------------------------------------------------------------------------- Arrival Information Details Patient Name: Date of Service: [name], [name]. 05/31/2023 2:00 PM Medical Record Number: [id number] Patient Account Number: [id number] Date of Birth/Sex: Treating RN: [birth year] (57 y.o. [name] Primary Care Sandip Power: Eleanora Neighbor Other Clinician: Referring Betsie Peckman: Treating Tamiah Dysart/Extender: Scheryl Marten, MA [name]: 0 Visit Information Patient Arrived: Ambulatory Arrival Time: 14:25 Accompanied By: wife Transfer Assistance: None Patient Identification Verified: Yes Secondary Verification Process Completed: Yes Patient Requires Transmission-Based Precautions: No Patient Has Alerts: Yes Patient Alerts: Not diabetic Electronic Signature(s) Signed: 05/31/2023 5:10:46 PM By: Midge Aver MSN RN CNS WTA Entered By: Midge Aver on 05/31/2023 11:25:27 [name] ([id number]) [id number] Page 2 of 10 -------------------------------------------------------------------------------- Clinic Level of Care Assessment Details Patient Name: Date of Service: [name]. 05/31/2023 2:00 PM Medical Record Number: [id number] Patient  Account Number: [id number] Date of Birth/Sex: Treating RN: Apr 09, 1966 (57 y.o. [name] Primary Care Ercie Eliasen: Eleanora Neighbor Other Clinician: Referring Dimple Bastyr: Treating Nancyann Cotterman/Extender: Scheryl Marten, MA [name]: 0 Clinic Level of Care Assessment Items TOOL 1 Quantity Score X- 1 0 Use when EandM and Procedure is performed on INITIAL visit ASSESSMENTS - Nursing Assessment / Reassessment X- 1 20 General Physical Exam (combine w/ comprehensive assessment (listed just below) when performed on new pt. evals) X- 1 25 Comprehensive Assessment (HX, ROS, Risk Assessments, Wounds Hx, etc.) ASSESSMENTS - Wound and Skin Assessment / Reassessment []  - 0 Dermatologic / Skin Assessment (not related to wound area) ASSESSMENTS - Ostomy and/or Continence Assessment and Care []  - 0 Incontinence Assessment and Management []  - 0 Ostomy Care Assessment and Management (repouching, etc.) PROCESS - Coordination of Care X - Simple Patient / Family Education for ongoing care 1 15 []  - 0 Complex (extensive) Patient / Family Education for ongoing care X- 1 10 Staff obtains Chiropractor, Records, [name] Results / Process Orders est []  - 0 Staff telephones HHA, Nursing Homes / Clarify orders / etc []  - 0 Routine Transfer to another Facility (non-emergent condition) []  - 0 Routine Hospital Admission (non-emergent condition) X- 1 15 New Admissions / Manufacturing engineer / Ordering NPWT Apligraf, etc. , []  - 0 Emergency Hospital Admission (emergent condition) PROCESS - Special Needs []  - 0 Pediatric / Minor Patient Management []  - 0 Isolation Patient Management []  - 0 Hearing / Language / Visual special needs []  - 0 Assessment of Community assistance (transportation, D/C planning, etc.) []  - 0 Additional assistance / Altered mentation []  - 0 Support Surface(s) Assessment (bed, cushion, seat, etc.) INTERVENTIONS - Miscellaneous []  - 0 External ear exam []  -  0 Patient Transfer (multiple staff / Nurse, adult / Similar devices) []  - 0 Simple Staple / Suture removal (25 or less) []  - 0 Complex Staple / Suture removal (26 or more) [name], [name] ([id number]) [id number] Page 3 of 10 []  - 0 Hypo/Hyperglycemic Management (do  not check if billed separately) X- 1 15 Ankle / Brachial Index (ABI) - do not check if billed separately Has the patient been seen at the hospital within the last three years: Yes Total Score: 100 Level Of Care: New/Established - Level 3 Electronic Signature(s) Signed: 05/31/2023 5:10:46 PM By: Midge Aver MSN RN CNS WTA Entered By: Midge Aver on 05/31/2023 12:24:05 -------------------------------------------------------------------------------- Encounter Discharge Information Details Patient Name: Date of Service: [name], [name]. 05/31/2023 2:00 PM Medical Record Number: [id number] Patient Account Number: [id number] Date of Birth/Sex: Treating RN: 16-Jan-1966 (57 y.o. [name] Primary Care Gerarda Conklin: Eleanora Neighbor Other Clinician: Referring Herrick Hartog: Treating Annissa Andreoni/Extender: Scheryl Marten, MA [name]: 0 Encounter Discharge Information Items Post Procedure Vitals Discharge Condition: Stable Temperature (F): 98.4 Ambulatory Status: Ambulatory Pulse (bpm): 70 Discharge Destination: Home Respiratory Rate (breaths/min): 18 Transportation: Private Auto Blood Pressure (mmHg): 154/87 Accompanied By: wife Schedule Follow-up Appointment: Yes Clinical Summary of Care: Electronic Signature(s) Signed: 05/31/2023 4:24:42 PM By: Midge Aver MSN RN CNS WTA Entered By: Midge Aver on 05/31/2023 13:24:41 -------------------------------------------------------------------------------- Lower Extremity Assessment Details Patient Name: Date of Service: [name], [name]. 05/31/2023 2:00 PM Medical Record Number: [id number] Patient Account Number: [id number] Date of  Birth/Sex: Treating RN: [birth year] (57 y.o. [name] Primary Care Samah Lapiana: Eleanora Neighbor Other Clinician: Referring Skila Rollins: Treating Dallan Schonberg/Extender: Scheryl Marten, MA [name]: 0 Edema Assessment Assessed: [Left: No] [Right: No] Edema: [Left: No] [Right: No] [name][LeftSEMAJAY, SINGLE [place] [Right: [id number] Page 4 of 10] Calf Left: Right: Point of Measurement: 39 cm From Medial Instep 39.5 cm Ankle Left: Right: Point of Measurement: 12 cm From Medial Instep 23.5 cm Vascular Assessment Pulses: Dorsalis Pedis Palpable: [Left:Yes] [Right:Yes] Extremity colors, hair growth, and conditions: Extremity Color: [Left:Normal] [Right:Normal] Hair Growth on Extremity: [Left:Yes] [Right:Yes] Temperature of Extremity: [Left:Warm] [Right:Warm] Capillary Refill: [Left:< 3 seconds] [Right:< 3 seconds] Dependent Rubor: [Left:No] [Right:No] Blanched when Elevated: [Left:No] [Right:No] Lipodermatosclerosis: [Left:No] [Right:No] Blood Pressure: Brachial: [Left:154] [Right:154] Ankle: [Left:Dorsalis Pedis: 130 0.84] [Right:Dorsalis Pedis: 140 0.91] Toe Nail Assessment Left: Right: Thick: Yes Yes Discolored: Yes Yes Deformed: Yes Yes Improper Length and Hygiene: Yes Yes Electronic Signature(s) Signed: 05/31/2023 5:10:46 PM By: Midge Aver MSN RN CNS WTA Entered By: Midge Aver on 05/31/2023 11:46:58 -------------------------------------------------------------------------------- Multi Wound Chart Details Patient Name: Date of Service: [name], [name]. 05/31/2023 2:00 PM Medical Record Number: [id number] Patient Account Number: [id number] Date of Birth/Sex: Treating RN: [birth year] (57 y.o. [name] Primary Care Sinai Illingworth: Eleanora Neighbor Other Clinician: Referring Grady Lucci: Treating Cuthbert Turton/Extender: Scheryl Marten, MA [name]: 0 Vital Signs Height(in): 73 Pulse(bpm):  70 Weight(lbs): 205 Blood Pressure(mmHg): 154/87 Body Mass Index(BMI): 27 Temperature(F): 98.4 Respiratory Rate(breaths/min): 18 [1:Photos:] [N/A:N/A] Right Calcaneus N/A N/A Wound Location: Footwear Injury N/A N/A Wounding Event: Pressure Ulcer N/A N/A Primary Etiology: Hypertension N/A N/A Comorbid History: 05/20/2023 N/A N/A Date Acquired: 0 N/A N/A Weeks of [name]: Open N/A N/A Wound Status: No N/A N/A Wound Recurrence: Yes N/A N/A Clustered Wound: 8.2x5.5x0.2 N/A N/A Measurements L x W x D (cm) 35.421 N/A N/A A (cm) : rea 7.084 N/A N/A Volume (cm) : Unstageable/Unclassified N/A N/A Classification: Medium N/A N/A Exudate A mount: Serosanguineous N/A N/A Exudate Type: red, brown N/A N/A Exudate Color: None Present (0%) N/A N/A Granulation A mount: Large (67-100%) N/A N/A Necrotic A mount: Eschar N/A N/A Necrotic Tissue: Fat Layer (Subcutaneous Tissue): Yes N/A  N/A Exposed Structures: Fascia: No Tendon: No Muscle: No Joint: No Bone: No None N/A N/A Epithelialization: [name] Notes Electronic Signature(s) Signed: 05/31/2023 5:10:46 PM By: Midge Aver MSN RN CNS WTA Entered By: Midge Aver on 05/31/2023 12:13:20 -------------------------------------------------------------------------------- Multi-Disciplinary Care Plan Details Patient Name: Date of Service: [name], [name]. 05/31/2023 2:00 PM Medical Record Number: [id number] Patient Account Number: [id number] Date of Birth/Sex: Treating RN: [birth year] (57 y.o. [name] Primary Care Latise Dilley: Eleanora Neighbor Other Clinician: Referring Roseanne Juenger: Treating Stephen Baruch/Extender: Scheryl Marten, MA [name]: 0 Active Inactive Necrotic Tissue Nursing Diagnoses: Robert, [name] ([id number]) [phone] Page 6 of 10 Impaired tissue integrity related to necrotic/devitalized tissue Knowledge deficit related to management of necrotic/devitalized  tissue Goals: Necrotic/devitalized tissue will be minimized in the wound bed Date Initiated: 05/31/2023 Target Resolution Date: 06/30/2023 Goal Status: Active Patient/caregiver will verbalize understanding of reason and process for debridement of necrotic tissue Date Initiated: 05/31/2023 Target Resolution Date: 06/30/2023 Goal Status: Active Interventions: Assess patient pain level pre-, during and post procedure and prior to discharge Provide education on necrotic tissue and debridement process [name] Activities: Apply topical anesthetic as ordered : 05/31/2023 Enzymatic debridement : 05/31/2023 Notes: Orientation to the Wound Care Program Nursing Diagnoses: Knowledge deficit related to the wound healing center program Goals: Patient/caregiver will verbalize understanding of the Wound Healing Center Program Date Initiated: 05/31/2023 Target Resolution Date: 06/07/2023 Goal Status: Active Interventions: Provide education on orientation to the wound center Notes: Wound/Skin Impairment Nursing Diagnoses: Impaired tissue integrity Knowledge deficit related to ulceration/compromised skin integrity Goals: Patient/caregiver will verbalize understanding of skin care regimen Date Initiated: 05/31/2023 Target Resolution Date: 06/30/2023 Goal Status: Active Ulcer/skin breakdown will have a volume reduction of 30% by week 4 Date Initiated: 05/31/2023 Target Resolution Date: 06/30/2023 Goal Status: Active Ulcer/skin breakdown will have a volume reduction of 50% by week 8 Date Initiated: 05/31/2023 Target Resolution Date: 07/31/2023 Goal Status: Active Ulcer/skin breakdown will have a volume reduction of 80% by week 12 Date Initiated: 05/31/2023 Target Resolution Date: 08/30/2023 Goal Status: Active Ulcer/skin breakdown will heal within 14 weeks Date Initiated: 05/31/2023 Target Resolution Date: 09/06/2023 Goal Status: Active Interventions: Assess patient/caregiver ability to obtain necessary  supplies Assess patient/caregiver ability to perform ulcer/skin care regimen upon admission and as needed Assess ulceration(s) every visit Provide education on ulcer and skin care Notes: Electronic Signature(s) Signed: 05/31/2023 3:55:04 PM By: Midge Aver MSN RN CNS WTA Previous Signature: 05/31/2023 3:55:00 PM Version By: Midge Aver MSN RN CNS WTA Entered By: Midge Aver on 05/31/2023 12:55:04 [name] ([id number]) [id number] Page 7 of 10 -------------------------------------------------------------------------------- Pain Assessment Details Patient Name: Date of Service: [name]. 05/31/2023 2:00 PM Medical Record Number: [id number] Patient Account Number: [id number] Date of Birth/Sex: Treating RN: [birth year] (57 y.o. [name] Primary Care Kyliee Ortego: Eleanora Neighbor Other Clinician: Referring Brookelynn Hamor: Treating Riely Oetken/Extender: Scheryl Marten, MA [name]: 0 Active Problems Location of Pain Severity and Description of Pain Patient Has Paino Yes Site Locations Rate the pain. Current Pain Level: 6 Pain Management and Medication Current Pain Management: Electronic Signature(s) Signed: 05/31/2023 5:10:46 PM By: Midge Aver MSN RN CNS WTA Entered By: Midge Aver on 05/31/2023 11:26:29 -------------------------------------------------------------------------------- Patient/Caregiver Education Details Patient Name: Date of Service: [name], [name] 9/9/2024andnbsp2:00 PM Medical Record Number: [id number] Patient Account Number: [id number] Date of Birth/Gender: Treating RN: [birth year] (57 y.o. [name] Primary Care Physician: Eleanora Neighbor Other Clinician: Referring  Physician: Treating Physician/Extender: Scheryl Marten, MA Robert Weeks in Treatment[address] [name], [name] ([id number]) [id number] Page 8 of 10 Education Assessment Education Provided  To: Patient Education Topics Provided Welcome [name]-New Patient Packet: o Handouts: [name] o Wound Debridement: Handouts: Wound Debridement Methods: Explain/Verbal Responses: State content correctly Wound/Skin Impairment: Handouts: Caring for Your Ulcer Methods: Explain/Verbal Responses: State content correctly Electronic Signature(s) Signed: 05/31/2023 5:10:46 PM By: Midge Aver MSN RN CNS WTA Entered By: Midge Aver on 05/31/2023 12:55:28 -------------------------------------------------------------------------------- Wound Assessment Details Patient Name: Date of Service: [name], [name]. 05/31/2023 2:00 PM Medical Record Number: [id number] Patient Account Number: [id number] Date of Birth/Sex: Treating RN: [birth year] (57 y.o. [name] Primary Care Heavyn Yearsley: Eleanora Neighbor Other Clinician: Referring Ashani Pumphrey: Treating Daquann Merriott/Extender: Scheryl Marten, MA [name]: 0 Wound Status Wound Number: 1 Primary Etiology: Pressure Ulcer Wound Location: Right Calcaneus Wound Status: Open Wounding Event: Gradually Appeared Comorbid History: Hypertension Date Acquired: 05/20/2023 Weeks Of [name]: 0 Clustered Wound: Yes Photos Wound Measurements Length: (cm) 8.2 Width: (cm) 5.5 Depth: (cm) 0.2 Area: (cm) 35.421 [name], [name] ([id number]) Volume: (cm) % Reduction in Area: % Reduction in Volume: Epithelialization: None [id number] Page 9 of 10 7.084 Wound Description Classification: Unstageable/Unclassified Exudate Amount: Medium Exudate Type: Serosanguineous Exudate Color: red, brown Foul Odor After Cleansing: No Slough/Fibrino No Wound Bed Granulation Amount: None Present (0%) Exposed Structure Necrotic Amount: Large (67-100%) Fascia Exposed: No Necrotic Quality: Eschar Fat Layer (Subcutaneous Tissue) Exposed: Yes Tendon Exposed: No Muscle Exposed: No Joint  Exposed: No Bone Exposed: No [name] Notes Wound #1 (Calcaneus) Wound Laterality: Right Cleanser Dakin 16 (oz) 0.25 Discharge Instruction: Use supplies as instructed; Kit contains: (15) Saline Bullets; (15) 3x3 Gauze; 15 pr Gloves Soap and Water Discharge Instruction: Use as directed. Peri-Wound Care Topical Primary Dressing Secondary Dressing ABD Pad 5x9 (in/in) Discharge Instruction: Cover with ABD pad Secured With Compression Wrap Compression Stockings Add-Ons Electronic Signature(s) Signed: 05/31/2023 5:23:33 PM By: Elliot Gurney, BSN, RN, CWS, Kim RN, BSN Previous Signature: 05/31/2023 5:10:46 PM Version By: Midge Aver MSN RN CNS WTA Entered By: Elliot Gurney, BSN, RN, CWS, Kim on 05/31/2023 14:23:32 -------------------------------------------------------------------------------- Vitals Details Patient Name: Date of Service: [name], [name]. 05/31/2023 2:00 PM Medical Record Number: [id number] Patient Account Number: [id number] Date of Birth/Sex: Treating RN: Jan 22, 1966 (57 y.o. [name] Primary Care Tara Wich: Eleanora Neighbor Other Clinician: Referring Jodie Cavey: Treating Analys Ryden/Extender: Scheryl Marten, MA [name]: 0 Vital Signs Time Taken: 14:26 Temperature (F): 98.4 [name], [name] ([id number]) [id number] Page 10 of 10 Height (in): 73 Pulse (bpm): 70 Source: Stated Respiratory Rate (breaths/min): 18 Weight (lbs): 205 Blood Pressure (mmHg): 154/87 Source: Stated Reference Range: 80 - 120 mg / dl Body Mass Index (BMI): 27 Electronic Signature(s) Signed: 05/31/2023 5:10:46 PM By: Midge Aver MSN RN CNS WTA Entered By: Midge Aver on 05/31/2023 11:27:04

## 2023-05-31 NOTE — Progress Notes (Signed)
[name], [name] ([id number]) [id number] Page 1 of 5 Visit Report for 05/31/2023 Abuse Risk Screen Details Patient Name: Date of Service: Robert [place] [name]. 05/31/2023 2:00 PM Medical Record Number: [id number] Patient Account Number: [id number] Date of Birth/Sex: Treating RN: [birth year] (57 y.o. [name] Primary Care Jesaiah Fabiano: Eleanora Neighbor Other Clinician: Referring Valon Glasscock: Treating Callum Wolf/Extender: Scheryl Marten, MA TTHEW Weeks in Treatment: 0 Abuse Risk Screen Items Answer Electronic Signature(s) Signed: 05/31/2023 5:10:46 PM By: Midge Aver MSN RN CNS WTA Entered By: Midge Aver on 05/31/2023 11:28:52 -------------------------------------------------------------------------------- Activities of Daily Living Details Patient Name: Date of Service: Robert [place] [name]. 05/31/2023 2:00 PM Medical Record Number: [id number] Patient Account Number: [id number] Date of Birth/Sex: Treating RN: [birth year] (57 y.o. [name] Primary Care Jaymison Luber: Eleanora Neighbor Other Clinician: Referring Preslie Depasquale: Treating Cammie Faulstich/Extender: Scheryl Marten, MA TTHEW Weeks in Treatment: 0 Activities of Daily Living Items Answer Activities of Daily Living (Please select one for each item) Drive Automobile Completely Able [name] Medications ake Completely Able Use [name] elephone Completely Able Care for Appearance Completely Able Use [name] oilet Completely Able Bath / Shower Completely Able Dress Self Completely Able Feed Self Completely Able Walk Completely Able Get In / Out Bed Completely Able Housework Completely Able Prepare Meals Completely Able Handle Money Completely Able Shop for Self Completely BRYN, YERKE ([id number]) [id number] [id number].pdf Page 2 of 5 Electronic Signature(s) Signed: 05/31/2023 5:10:46 PM By: Midge Aver MSN RN CNS WTA Entered By: Midge Aver on 05/31/2023  11:29:05 -------------------------------------------------------------------------------- Education Screening Details Patient Name: Date of Service: [name], Robert [name]. 05/31/2023 2:00 PM Medical Record Number: [id number] Patient Account Number: [id number] Date of Birth/Sex: Treating RN: October 16, 1965 (57 y.o. [name] Primary Care Zelie Asbill: Eleanora Neighbor Other Clinician: Referring Chandel Zaun: Treating Tabby Beaston/Extender: Scheryl Marten, MA TTHEW Weeks in Treatment: 0 Learning Preferences/Education Level/Primary Language Learning Preference: Explanation, Demonstration Preferred Language: English Cognitive Barrier Language Barrier: No Translator Needed: No Memory Deficit: No Emotional Barrier: No Cultural/Religious Beliefs Affecting Medical Care: No Physical Barrier Impaired Vision: Yes Glasses Impaired Hearing: No Decreased Hand dexterity: No Knowledge/Comprehension Knowledge Level: High Comprehension Level: High Ability to understand written instructions: High Ability to understand verbal instructions: High Motivation Anxiety Level: Calm Cooperation: Cooperative Education Importance: Acknowledges Need Interest in Health Problems: Asks Questions Perception: Coherent Willingness to Engage in Self-Management High Activities: Readiness to Engage in Self-Management High Activities: Electronic Signature(s) Signed: 05/31/2023 5:10:46 PM By: Midge Aver MSN RN CNS WTA Entered By: Midge Aver on 05/31/2023 11:29:30 [name] ([id number]) [id number] [id number].pdf Page 3 of 5 -------------------------------------------------------------------------------- Fall Risk Assessment Details Patient Name: Date of Service: Robert [place] [name]. 05/31/2023 2:00 PM Medical Record Number: [id number] Patient Account Number: [id number] Date of Birth/Sex: Treating RN: 09-25-1965 (57 y.o. [name] Primary Care Rahiem Schellinger: Eleanora Neighbor Other  Clinician: Referring Jshon Ibe: Treating Jozee Hammer/Extender: Scheryl Marten, MA TTHEW Weeks in Treatment: 0 Fall Risk Assessment Items Have you had 2 or more falls in the last 12 monthso 0 No Have you had any fall that resulted in injury in the last 12 monthso 0 No FALLS RISK SCREEN History of falling - immediate or within 3 months 0 No Secondary diagnosis (Do you have 2 or more medical diagnoseso) 0 No Ambulatory aid None/bed rest/wheelchair/nurse 0 No Crutches/cane/walker 0 No Furniture 0 No Intravenous therapy Access/Saline/Heparin Lock 0 No Gait/Transferring Normal/ bed rest/ wheelchair 0 No Weak (short steps with or  without shuffle, stooped but able to lift head while walking, may seek 0 No support from furniture) Impaired (short steps with shuffle, may have difficulty arising from chair, head down, impaired 0 No balance) Mental Status Oriented to own ability 0 Yes Electronic Signature(s) Signed: 05/31/2023 5:10:46 PM By: Midge Aver MSN RN CNS WTA Entered By: Midge Aver on 05/31/2023 11:29:43 -------------------------------------------------------------------------------- Foot Assessment Details Patient Name: Date of Service: [name], Robert [name]. 05/31/2023 2:00 PM Medical Record Number: [id number] Patient Account Number: [id number] Date of Birth/Sex: Treating RN: [birth year] (57 y.o. [name] Primary Care Britiny Defrain: Eleanora Neighbor Other Clinician: Referring Merland Holness: Treating Shanina Kepple/Extender: Scheryl Marten, MA TTHEW Weeks in Treatment: 0 Foot Assessment Items Site Locations [place], [place] [name] ([id number]) [id number] [id number].pdf Page 4 of 5 + = Sensation present, - = Sensation absent, C = Callus, U = Ulcer R = Redness, W = Warmth, M = Maceration, PU = Pre-ulcerative lesion F = Fissure, S = Swelling, D = Dryness Assessment Right: Left: Other Deformity: No No Prior Foot Ulcer: No No Prior Amputation: No No Charcot Joint: No  No Ambulatory Status: Ambulatory Without Help Gait: Electronic Signature(s) Signed: 05/31/2023 5:10:46 PM By: Midge Aver MSN RN CNS WTA Entered By: Midge Aver on 05/31/2023 11:35:59 -------------------------------------------------------------------------------- Nutrition Risk Screening Details Patient Name: Date of Service: [name], Robert [name]. 05/31/2023 2:00 PM Medical Record Number: [id number] Patient Account Number: [id number] Date of Birth/Sex: Treating RN: 23-Oct-1965 (57 y.o. [name] Primary Care Rishi Vicario: Eleanora Neighbor Other Clinician: Referring Cora Stetson: Treating Susanne Baumgarner/Extender: Scheryl Marten, MA TTHEW Weeks in Treatment: 0 Height (in): 73 Weight (lbs): 205 Body Mass Index (BMI): 27 Nutrition Risk Screening Items Score Screening NUTRITION RISK SCREEN: I have an illness or condition that made me change the kind and/or amount of food I eat 0 No I eat fewer than two meals per day 0 No I eat few fruits and vegetables, or milk products 0 No I have three or more drinks of beer, liquor or wine almost every day 0 No I have tooth or mouth problems that make it hard for me to eat 0 No I don'[name] always have enough money to buy the food I need 0 No [name], [name] ([id number]) [phone] [id number].pdf Page 5 of 5 I eat alone most of the time 0 No I take three or more different prescribed or over-the-counter drugs a day 0 No Without wanting to, I have lost or gained 10 pounds in the last six months 0 No I am not always physically able to shop, cook and/or feed myself 0 No Nutrition Protocols Good Risk Protocol 0 No interventions needed Moderate Risk Protocol High Risk Proctocol Risk Level: Good Risk Score: 0 Electronic Signature(s) Signed: 05/31/2023 5:10:46 PM By: Midge Aver MSN RN CNS WTA Entered By: Midge Aver on 05/31/2023 11:30:09

## 2023-06-03 NOTE — Progress Notes (Addendum)
PRICE, TURETSKY [name] ([id number]) [id number] Page 1 of 9 Visit Report for 05/31/2023 Chief Complaint Document Details Patient Name: Date of Service: Robert Lav [place] [name]. 05/31/2023 2:00 PM Medical Record Number: [id number] Patient Account Number: [id number] Date of Birth/Sex: Treating RN: September 17, 1966 (57 y.o. [name] Primary Care Provider: Eleanora Neighbor Other Clinician: Referring Provider: Treating Provider/Extender: Scheryl Marten, MA TTHEW Weeks in Treatment: 0 Information Obtained from: Patient Chief Complaint Right heel ulcer Electronic Signature(s) Signed: 05/31/2023 3:08:08 PM By: Allen Derry PA-C Entered By: Allen Derry on 05/31/2023 15:08:08 -------------------------------------------------------------------------------- Debridement Details Patient Name: Date of Service: [name], [name]. 05/31/2023 2:00 PM Medical Record Number: [id number] Patient Account Number: [id number] Date of Birth/Sex: Treating RN: 01-02-1966 (57 y.o. [name] Primary Care Provider: Eleanora Neighbor Other Clinician: Referring Provider: Treating Provider/Extender: Scheryl Marten, MA TTHEW Weeks in Treatment: 0 Debridement Performed for Assessment: Wound #1 Right Calcaneus Performed By: Physician Allen Derry, PA-C Debridement Type: Chemical/Enzymatic/Mechanical Agent Used: Santyl Level of Consciousness (Pre-procedure): Awake and Alert Pre-procedure Verification/Time Out Yes - 15:14 Taken: Start Time: 15:14 Pain Control: Lidocaine 4% Topical Solution Percent of Wound Bed Debrided: Instrument: Other : Santyl Bleeding: None Procedural Pain: 0 Post Procedural Pain: 0 Response to Treatment: Procedure was tolerated well Level of Consciousness (Post- Awake and Alert procedure): Post Debridement Measurements of Total Wound [name], [name] ([id number]) [id number] Page 2 of 9 Length: (cm) 8.2 Stage:  Unstageable/Unclassified Width: (cm) 5.5 Depth: (cm) 0.2 Volume: (cm) 7.084 Character of Wound/Ulcer Post Debridement: Stable Post Procedure Diagnosis Same as Pre-procedure Electronic Signature(s) Signed: 05/31/2023 5:10:46 PM By: Midge Aver MSN RN CNS WTA Signed: 06/03/2023 4:31:22 PM By: Allen Derry PA-C Entered By: Midge Aver on 05/31/2023 15:15:40 -------------------------------------------------------------------------------- HPI Details Patient Name: Date of Service: [name], [name]. 05/31/2023 2:00 PM Medical Record Number: [id number] Patient Account Number: [id number] Date of Birth/Sex: Treating RN: 1965/11/19 (57 y.o. [name] Primary Care Provider: Eleanora Neighbor Other Clinician: Referring Provider: Treating Provider/Extender: Scheryl Marten, MA TTHEW Weeks in Treatment: 0 History of Present Illness HPI Description: 05-31-2023 patient presents today for initial evaluation here in the clinic concerning issues that he is having with an eschar over the right heel. Unfortunately this came on seemingly fairly suddenly when he was at work. He does work around [employer] but states he was not doing anything directly with them at that point. Again I am unsure exactly what went on here and to what degree the chemicals could have played a role in this but this almost looks to me more like damage secondary to a chemical burn versus a strict pressure ulcer although I am unsure to be certain. The issue here is simply that he has 3 areas that are somewhat separated by some skin in between although to some degree connected as well which is very necrotic and eschar covered and to be honest getting any dressing to this is not really going to be of benefit at this point. Fortunately I do not see any signs of active infection at this time which is good news. With that being said I do think that this is gena be difficult to get heal until we get the eschar off of the area. The  patient voiced understanding and his wife was present during the evaluation today as well. He does have hypertension but no other major medical problems. [name] my knowledge this is not a workers o comp case although I am  unsure whether or not it should be to be honest. Electronic Signature(s) Signed: 06/03/2023 5:04:08 PM By: Allen Derry PA-C Entered By: Allen Derry on 06/03/2023 17:04:08 -------------------------------------------------------------------------------- Physical Exam Details Patient Name: Date of Service: [name], [name]. 05/31/2023 2:00 PM Medical Record Number: [id number] Patient Account Number: [id number] Date of Birth/Sex: Treating RN: 1966/02/28 (57 y.o. [name] Primary Care Provider: Eleanora Neighbor Other Clinician: Referring Provider: Treating Provider/Extender: Scheryl Marten, MA TTHEW [place], Silvestre Gunner ([id number]) [id number] Page 3 of 9 Weeks in Treatment: 0 Constitutional patient is hypertensive.. pulse regular and within target range for patient.Marland Kitchen respirations regular, non-labored and within target range for patient.Marland Kitchen temperature within target range for patient.. Well-nourished and well-hydrated in no acute distress. Eyes conjunctiva clear no eyelid edema noted. pupils equal round and reactive to light and accommodation. Ears, Nose, Mouth, and Throat no gross abnormality of ear auricles or external auditory canals. normal hearing noted during conversation. mucus membranes moist. Respiratory normal breathing without difficulty. Cardiovascular 2+ dorsalis pedis/posterior tibialis pulses. no clubbing, cyanosis, significant edema, <3 sec cap refill. Musculoskeletal normal gait and posture. no significant deformity or arthritic changes, no loss or range of motion, no clubbing. Psychiatric this patient is able to make decisions and demonstrates good insight into disease process. Alert and Oriented x 3. pleasant and  cooperative. Notes Upon inspection patient's wound again showed eschar covered areas which are quite significant. I again explained to the patient that this looks to be almost more of a chemical type burn something of which I have seen in other patients in the past although again we do not have an exact mechanism by which this would have occurred as the patient states he does not know of any chemical that he got into at the time this started happening he just had a burning sensation in his heel he thought that it was just something that might be tingling or causing problems and then subsequently when he woke that went and looked at it later this is what was starting to develop rather rapidly. Again I do not think this is a brown recluse as nothing would have occurred that rapidly with a recluse bite or black widow for that matter. He seems to have good arterial flow in the foot with capillary refill which is less than 3 seconds pulses which are 2+ in the dorsalis pedis and posterior tibial location. I see no signs of vascular compromise. Electronic Signature(s) Signed: 06/03/2023 5:05:15 PM By: Allen Derry PA-C Entered By: Allen Derry on 06/03/2023 17:05:14 -------------------------------------------------------------------------------- Physician Orders Details Patient Name: Date of Service: [name], [name]. 05/31/2023 2:00 PM Medical Record Number: [id number] Patient Account Number: [id number] Date of Birth/Sex: Treating RN: [birth year] (58 y.o. [name] Primary Care Provider: Eleanora Neighbor Other Clinician: Referring Provider: Treating Provider/Extender: Scheryl Marten, MA TTHEW Weeks in Treatment: 0 Verbal / Phone Orders: No Diagnosis Coding ICD-10 Coding Code Description L89.610 Pressure ulcer of right heel, unstageable I10 Essential (primary) hypertension Follow-up Appointments Return Appointment in 1 week. Bathing/ Shower/ Hygiene May shower; gently cleanse wound  with antibacterial soap, rinse and pat dry prior to dressing wounds Anesthetic (Use 'Patient Medications' Section for Anesthetic Order Entry) HUXON, LEVIER [name] ([id number]) [id number] Page 4 of 9 Lidocaine applied to wound bed Wound Treatment Wound #1 - Calcaneus Wound Laterality: Right Cleanser: Dakin 16 (oz) 0.25 (DME) (Generic) 1 x Per Day/30 Days Discharge Instructions: Use supplies as instructed; Kit contains: (15) Saline Bullets; (15)  3x3 Gauze; 15 pr Gloves Cleanser: Soap and Water 1 x Per Day/30 Days Discharge Instructions: Use as directed. Secondary Dressing: ABD Pad 5x9 (in/in) (DME) (Generic) 1 x Per Day/30 Days Discharge Instructions: Cover with ABD pad Patient Medications llergies: No Known Allergies A Notifications Medication Indication Start End 05/31/2023 Santyl DOSE topical 250 unit/gram ointment - ointment topical once daily Apply nickel thick daily to the wound bed and then cover with a dressing as directed in clinic x 30 days Electronic Signature(s) Signed: 05/31/2023 5:10:46 PM By: Midge Aver MSN RN CNS WTA Signed: 06/03/2023 4:31:22 PM By: Allen Derry PA-C Previous Signature: 05/31/2023 3:28:53 PM Version By: Allen Derry PA-C Entered By: Midge Aver on 05/31/2023 15:46:01 -------------------------------------------------------------------------------- Problem List Details Patient Name: Date of Service: [name], [name]. 05/31/2023 2:00 PM Medical Record Number: [id number] Patient Account Number: [id number] Date of Birth/Sex: Treating RN: Sep 05, 1966 (57 y.o. [name] Primary Care Provider: Eleanora Neighbor Other Clinician: Referring Provider: Treating Provider/Extender: Scheryl Marten, MA TTHEW Weeks in Treatment: 0 Active Problems ICD-10 Encounter Code Description Active Date MDM Diagnosis L89.610 Pressure ulcer of right heel, unstageable 05/31/2023 No Yes I10 Essential (primary) hypertension 05/31/2023 No Yes Inactive  Problems Resolved Problems Electronic Signature(s) Signed: 05/31/2023 3:06:26 PM By: Rockie Neighbours, Koen [name] ([id number]) [id number] Page 5 of 9 Entered By: Allen Derry on 05/31/2023 15:06:26 -------------------------------------------------------------------------------- Progress Note Details Patient Name: Date of Service: Robert [place] [name]. 05/31/2023 2:00 PM Medical Record Number: [id number] Patient Account Number: [id number] Date of Birth/Sex: Treating RN: Jul 06, 1966 (57 y.o. [name] Primary Care Provider: Eleanora Neighbor Other Clinician: Referring Provider: Treating Provider/Extender: Scheryl Marten, MA TTHEW Weeks in Treatment: 0 Subjective Chief Complaint Information obtained from Patient Right heel ulcer History of Present Illness (HPI) 05-31-2023 patient presents today for initial evaluation here in the clinic concerning issues that he is having with an eschar over the right heel. Unfortunately this came on seemingly fairly suddenly when he was at work. He does work around [employer] but states he was not doing anything directly with them at that point. Again I am unsure exactly what went on here and to what degree the chemicals could have played a role in this but this almost looks to me more like damage secondary to a chemical burn versus a strict pressure ulcer although I am unsure to be certain. The issue here is simply that he has 3 areas that are somewhat separated by some skin in between although to some degree connected as well which is very necrotic and eschar covered and to be honest getting any dressing to this is not really going to be of benefit at this point. Fortunately I do not see any signs of active infection at this time which is good news. With that being said I do think that this is gena be difficult to get heal until we get the eschar off of the area. The patient voiced understanding and his wife was present  during the evaluation today as well. He does have hypertension but no other major medical problems. [name] my knowledge this is not a workers comp o case although I am unsure whether or not it should be to be honest. Patient History Information obtained from Patient. Allergies No Known Allergies Social History Never smoker, Marital Status - Married, Alcohol Use - Never, Drug Use - No History, Caffeine Use - Rarely. Medical History Cardiovascular Patient has history of Hypertension Review of Systems (ROS)  Constitutional Symptoms (General Health) Denies complaints or symptoms of Fatigue, Fever, Chills, Marked Weight Change. Eyes Complains or has symptoms of Glasses / Contacts. Ear/Nose/Mouth/Throat Denies complaints or symptoms of Difficult clearing ears, Sinusitis. Hematologic/Lymphatic Denies complaints or symptoms of Bleeding / Clotting Disorders, Human Immunodeficiency Virus. Gastrointestinal Denies complaints or symptoms of Frequent diarrhea, Nausea, Vomiting. Endocrine Denies complaints or symptoms of Hepatitis, Thyroid disease, Polydypsia (Excessive Thirst). Genitourinary Denies complaints or symptoms of Kidney failure/ Dialysis, Incontinence/dribbling. Immunological Denies complaints or symptoms of Hives, Itching. Integumentary (Skin) Denies complaints or symptoms of Wounds, Bleeding or bruising tendency, Breakdown, Swelling. Musculoskeletal Denies complaints or symptoms of Muscle Pain, Muscle Weakness. Neurologic Denies complaints or symptoms of Numbness/parasthesias, Focal/Weakness. Psychiatric Denies complaints or symptoms of Anxiety, Claustrophobia. LANNIS, [name] ([id number]) [id number] Page 6 of 9 Objective Constitutional patient is hypertensive.. pulse regular and within target range for patient.Marland Kitchen respirations regular, non-labored and within target range for patient.Marland Kitchen temperature within target range for patient.. Well-nourished and  well-hydrated in no acute distress. Vitals Time Taken: 2:26 PM, Height: 73 in, Source: Stated, Weight: 205 lbs, Source: Stated, BMI: 27, Temperature: 98.4 F, Pulse: 70 bpm, Respiratory Rate: 18 breaths/min, Blood Pressure: 154/87 mmHg. Eyes conjunctiva clear no eyelid edema noted. pupils equal round and reactive to light and accommodation. Ears, Nose, Mouth, and Throat no gross abnormality of ear auricles or external auditory canals. normal hearing noted during conversation. mucus membranes moist. Respiratory normal breathing without difficulty. Cardiovascular 2+ dorsalis pedis/posterior tibialis pulses. no clubbing, cyanosis, significant edema, Musculoskeletal normal gait and posture. no significant deformity or arthritic changes, no loss or range of motion, no clubbing. Psychiatric this patient is able to make decisions and demonstrates good insight into disease process. Alert and Oriented x 3. pleasant and cooperative. General Notes: Upon inspection patient's wound again showed eschar covered areas which are quite significant. I again explained to the patient that this looks to be almost more of a chemical type burn something of which I have seen in other patients in the past although again we do not have an exact mechanism by which this would have occurred as the patient states he does not know of any chemical that he got into at the time this started happening he just had a burning sensation in his heel he thought that it was just something that might be tingling or causing problems and then subsequently when he woke that went and looked at it later this is what was starting to develop rather rapidly. Again I do not think this is a brown recluse as nothing would have occurred that rapidly with a recluse bite or black widow for that matter. He seems to have good arterial flow in the foot with capillary refill which is less than 3 seconds pulses which are 2+ in the dorsalis pedis and  posterior tibial location. I see no signs of vascular compromise. Integumentary (Hair, Skin) Wound #1 status is Open. Original cause of wound was Gradually Appeared. The date acquired was: 05/20/2023. The wound is located on the Right Calcaneus. The wound measures 8.2cm length x 5.5cm width x 0.2cm depth; 35.421cm^2 area and 7.084cm^3 volume. There is Fat Layer (Subcutaneous Tissue) exposed. There is a medium amount of serosanguineous drainage noted. There is no granulation within the wound bed. There is a large (67-100%) amount of necrotic tissue within the wound bed including Eschar. Assessment Active Problems ICD-10 Pressure ulcer of right heel, unstageable Essential (primary) hypertension Procedures Wound #1 Pre-procedure diagnosis of Wound #1 is a Pressure Ulcer  located on the Right Calcaneus . There was a Chemical/Enzymatic/Mechanical debridement performed by Allen Derry, PA-C. With the following instrument(s): Santyl after achieving pain control using Lidocaine 4% [name] opical Solution. Agent used was [employer]. A time out was conducted at 15:14, prior to the start of the procedure. There was no bleeding. The procedure was tolerated well with a pain level of 0 throughout and a pain level of 0 following the procedure. Post Debridement Measurements: 8.2cm length x 5.5cm width x 0.2cm depth; 7.084cm^3 volume. Post debridement Stage noted as Unstageable/Unclassified. Character of Wound/Ulcer Post Debridement is stable. Post procedure Diagnosis Wound #1: Same as Pre-Procedure Plan Follow-up Appointments: Return Appointment in 1 week. Bathing/ Shower/ Hygiene: May shower; gently cleanse wound with antibacterial soap, rinse and pat dry prior to dressing wounds Anesthetic (Use 'Patient Medications' Section for Anesthetic Order Entry): Lidocaine applied to wound bed [name], [name] ([id number]) [id number] Page 7 of 9 The following medication(s) was prescribed: Santyl  topical 250 unit/gram ointment ointment topical once daily Apply nickel thick daily to the wound bed and then cover with a dressing as directed in clinic x 30 days starting 05/31/2023 WOUND #1: - Calcaneus Wound Laterality: Right Cleanser: Dakin 16 (oz) 0.25 (DME) (Generic) 1 x Per Day/30 Days Discharge Instructions: Use supplies as instructed; Kit contains: (15) Saline Bullets; (15) 3x3 Gauze; 15 pr Gloves Cleanser: Soap and Water 1 x Per Day/30 Days Discharge Instructions: Use as directed. Secondary Dressing: ABD Pad 5x9 (in/in) (DME) (Generic) 1 x Per Day/30 Days Discharge Instructions: Cover with ABD pad 1. Based on what I am seeing I am concerned about the possibility of a chemical burn but I cannot be sure of this for now I am just relating this as a pressure ulcer. 2. Based on what I see I do feel like that scoring the area to try to allow for penetration of the medicine into the wound bed would be good. I discussed with the patient Dakin's moistened gauze over top of the Santyl I think would be ideal. With that being said I did have to submit Santyl for prior approval. 3. I am going to recommend as well that the patient should continue with the offloading and ideally it would probably be good for him to be out of work so that he is not putting pressure on this area. I would have to fill out paperwork for him we will work on trying to make that happen. 4. I am also going to recommend that the patient should continue with monitoring for any signs of infection right now I see no evidence of any infection but again that something that we got to keep a close eye on. We will see patient back for reevaluation in 1 week here in the clinic. If anything worsens or changes patient will contact our office for additional recommendations. Electronic Signature(s) Signed: 06/03/2023 5:06:17 PM By: Allen Derry PA-C Entered By: Allen Derry on 06/03/2023  17:06:17 -------------------------------------------------------------------------------- ROS/PFSH Details Patient Name: Date of Service: [name], [name]. 05/31/2023 2:00 PM Medical Record Number: [id number] Patient Account Number: [id number] Date of Birth/Sex: Treating RN: [birth year] (57 y.o. [name] Primary Care Provider: Eleanora Neighbor Other Clinician: Referring Provider: Treating Provider/Extender: Scheryl Marten, MA TTHEW Weeks in Treatment: 0 Information Obtained From Patient Constitutional Symptoms (General Health) Complaints and Symptoms: Negative for: Fatigue; Fever; Chills; Marked Weight Change Eyes Complaints and Symptoms: Positive for: Glasses / Contacts Ear/Nose/Mouth/Throat Complaints and Symptoms: Negative for: Difficult clearing ears; Sinusitis  Hematologic/Lymphatic Complaints and Symptoms: Negative for: Bleeding / Clotting Disorders; Human Immunodeficiency Virus Gastrointestinal Complaints and Symptoms: Negative for: Frequent diarrhea; Nausea; Vomiting ANEL, [name] ([id number]) [id number] Page 8 of 9 Endocrine Complaints and Symptoms: Negative for: Hepatitis; Thyroid disease; Polydypsia (Excessive Thirst) Genitourinary Complaints and Symptoms: Negative for: Kidney failure/ Dialysis; Incontinence/dribbling Immunological Complaints and Symptoms: Negative for: Hives; Itching Integumentary (Skin) Complaints and Symptoms: Negative for: Wounds; Bleeding or bruising tendency; Breakdown; Swelling Musculoskeletal Complaints and Symptoms: Negative for: Muscle Pain; Muscle Weakness Neurologic Complaints and Symptoms: Negative for: Numbness/parasthesias; Focal/Weakness Psychiatric Complaints and Symptoms: Negative for: Anxiety; Claustrophobia Cardiovascular Medical History: Positive for: Hypertension Oncologic Immunizations Pneumococcal Vaccine: Received Pneumococcal Vaccination: Yes Received Pneumococcal  Vaccination On or After 60th Birthday: No Implantable Devices None Family and Social History Never smoker; Marital Status - Married; Alcohol Use: Never; Drug Use: No History; Caffeine Use: Rarely Electronic Signature(s) Signed: 05/31/2023 5:10:46 PM By: Midge Aver MSN RN CNS WTA Signed: 06/03/2023 4:31:22 PM By: Allen Derry PA-C Entered By: Midge Aver on 05/31/2023 14:28:47 SuperBill Details -------------------------------------------------------------------------------- [name] ([id number]) [id number] Page 9 of 9 Patient Name: Date of Service: [place] [name]. 05/31/2023 Medical Record Number: [id number] Patient Account Number: [id number] Date of Birth/Sex: Treating RN: 02-Nov-1965 (57 y.o. [name] Primary Care Provider: Eleanora Neighbor Other Clinician: Referring Provider: Treating Provider/Extender: Scheryl Marten, MA TTHEW Weeks in Treatment: 0 Diagnosis Coding ICD-10 Codes Code Description L89.610 Pressure ulcer of right heel, unstageable I10 Essential (primary) hypertension Facility Procedures CPT4 Code Description Modifier Quantity [id number] - WOUND CARE VISIT-LEV 3 EST PT 1 [id number] - DEBRIDE W/O ANES NON SELECT 1 Physician Procedures Quantity CPT4 Code Description Modifier [id number] WC PHYS LEVEL 3 NEW PT 1 ICD-10 Diagnosis Description L89.610 Pressure ulcer of right heel, unstageable I10 Essential (primary) hypertension Electronic Signature(s) Signed: 06/03/2023 5:06:31 PM By: Allen Derry PA-C Previous Signature: 05/31/2023 5:10:46 PM Version By: Midge Aver MSN RN CNS WTA Previous Signature: 06/03/2023 4:31:22 PM Version By: Allen Derry PA-C Entered By: Allen Derry on 06/03/2023 17:06:30

## 2023-06-08 ENCOUNTER — Encounter: Payer: Managed Care, Other (non HMO) | Admitting: Physician Assistant

## 2023-06-08 DIAGNOSIS — L89610 Pressure ulcer of right heel, unstageable: Secondary | ICD-10-CM | POA: Diagnosis not present
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
## 2023-06-09 ENCOUNTER — Ambulatory Visit: Payer: Managed Care, Other (non HMO) | Admitting: Internal Medicine

## 2023-06-10 NOTE — Progress Notes (Signed)
No Joint: No Bone: No None N/A N/A Epithelialization: Treatment Notes Electronic Signature(s) Signed: 06/09/2023 5:31:20 PM By: Robert Aver MSN RN CNS WTA Entered By: [name] on 06/08/2023 10:33:44 -------------------------------------------------------------------------------- Multi-Disciplinary Care Plan Details Patient Name: Date of Service: [name], [name]. 06/08/2023 1:15 PM Medical Record Number: [id number] Patient Account Number: [id number] Date of Birth/Sex: Treating RN: [birth year] (57 y.o. [name] Primary Care [name]: [name] Other Clinician: Referring [name]: Treating [name]/Extender: [name]: 1 Active Inactive Necrotic Tissue Nursing Diagnoses: Impaired tissue integrity related to necrotic/devitalized tissue Knowledge deficit related to management of necrotic/devitalized tissue Goals: Necrotic/devitalized tissue will be minimized in the wound bed Date Initiated: 05/31/2023 Target Resolution Date: 06/30/2023 Goal Status: Active Patient/caregiver will verbalize understanding of reason and process for debridement of necrotic tissue [name], [name] ([id number]) [phone].pdf Page 4 of 7 Date Initiated: 05/31/2023 Target Resolution Date:  06/30/2023 Goal Status: Active Interventions: Assess patient pain level pre-, during and post procedure and prior to discharge Provide education on necrotic tissue and debridement process Treatment Activities: Apply topical anesthetic as ordered : 05/31/2023 Enzymatic debridement : 05/31/2023 Notes: Wound/Skin Impairment Nursing Diagnoses: Impaired tissue integrity Knowledge deficit related to ulceration/compromised skin integrity Goals: Patient/caregiver will verbalize understanding of skin care regimen Date Initiated: 05/31/2023 Target Resolution Date: 06/30/2023 Goal Status: Active Ulcer/skin breakdown will have a volume reduction of 30% by week 4 Date Initiated: 05/31/2023 Target Resolution Date: 06/30/2023 Goal Status: Active Ulcer/skin breakdown will have a volume reduction of 50% by week 8 Date Initiated: 05/31/2023 Target Resolution Date: 07/31/2023 Goal Status: Active Ulcer/skin breakdown will have a volume reduction of 80% by week 12 Date Initiated: 05/31/2023 Target Resolution Date: 08/30/2023 Goal Status: Active Ulcer/skin breakdown will heal within 14 weeks Date Initiated: 05/31/2023 Target Resolution Date: 09/06/2023 Goal Status: Active Interventions: Assess patient/caregiver ability to obtain necessary supplies Assess patient/caregiver ability to perform ulcer/skin care regimen upon admission and as needed Assess ulceration(s) every visit Provide education on ulcer and skin care Notes: Electronic Signature(s) Signed: 06/09/2023 5:31:20 PM By: Robert Aver MSN RN CNS WTA Entered By: [name] on 06/08/2023 11:26:18 -------------------------------------------------------------------------------- Pain Assessment Details Patient Name: Date of Service: [name], [name]. 06/08/2023 1:15 PM Medical Record Number: [id number] Patient Account Number: [id number] Date of Birth/Sex: Treating RN: 08/28/1966 (57 y.o. [name] Primary Care [name]: [name] Other  Clinician: Referring [name]: Treating [name]/Extender: [name]: 1 Active Problems Location of Pain Severity and Description of Pain Patient Has Paino No Site Locations [place], [place] [name] ([id number]) [id number] Page 5 of 7 Pain Management and Medication Current Pain Management: Electronic Signature(s) Signed: 06/09/2023 5:31:20 PM By: Robert Aver MSN RN CNS WTA Entered By: [name] on 06/08/2023 10:26:05 -------------------------------------------------------------------------------- Patient/Caregiver Education Details Patient Name: Date of Service: [name], [name] 9/17/2024andnbsp1:15 PM Medical Record Number: [id number] Patient Account Number: [id number] Date of Birth/Gender: Treating RN: 25-Feb-1966 (57 y.o. [name] Primary Care Physician: [name] Other Clinician: Referring Physician: Treating Physician/Extender: [name]: 1 Education Assessment Education Provided To: Patient Education Topics Provided Wound/Skin Impairment: Handouts: Caring for Your Ulcer Methods: Explain/Verbal Responses: State content correctly Electronic Signature(s) Signed: 06/09/2023 5:31:20 PM By: Robert Aver MSN RN CNS WTA Entered By: [name] on 06/08/2023 11:27:37 [name] ([id number]) [id number] Page 6 of 7 -------------------------------------------------------------------------------- Wound Assessment Details Patient Name: Date of Service: [name], [name]. 06/08/2023 1:15 PM  No Joint: No Bone: No None N/A N/A Epithelialization: Treatment Notes Electronic Signature(s) Signed: 06/09/2023 5:31:20 PM By: Robert Aver MSN RN CNS WTA Entered By: [name] on 06/08/2023 10:33:44 -------------------------------------------------------------------------------- Multi-Disciplinary Care Plan Details Patient Name: Date of Service: [name], [name]. 06/08/2023 1:15 PM Medical Record Number: [id number] Patient Account Number: [id number] Date of Birth/Sex: Treating RN: [birth year] (57 y.o. [name] Primary Care [name]: [name] Other Clinician: Referring [name]: Treating [name]/Extender: [name]: 1 Active Inactive Necrotic Tissue Nursing Diagnoses: Impaired tissue integrity related to necrotic/devitalized tissue Knowledge deficit related to management of necrotic/devitalized tissue Goals: Necrotic/devitalized tissue will be minimized in the wound bed Date Initiated: 05/31/2023 Target Resolution Date: 06/30/2023 Goal Status: Active Patient/caregiver will verbalize understanding of reason and process for debridement of necrotic tissue [name], [name] ([id number]) [phone].pdf Page 4 of 7 Date Initiated: 05/31/2023 Target Resolution Date:  06/30/2023 Goal Status: Active Interventions: Assess patient pain level pre-, during and post procedure and prior to discharge Provide education on necrotic tissue and debridement process Treatment Activities: Apply topical anesthetic as ordered : 05/31/2023 Enzymatic debridement : 05/31/2023 Notes: Wound/Skin Impairment Nursing Diagnoses: Impaired tissue integrity Knowledge deficit related to ulceration/compromised skin integrity Goals: Patient/caregiver will verbalize understanding of skin care regimen Date Initiated: 05/31/2023 Target Resolution Date: 06/30/2023 Goal Status: Active Ulcer/skin breakdown will have a volume reduction of 30% by week 4 Date Initiated: 05/31/2023 Target Resolution Date: 06/30/2023 Goal Status: Active Ulcer/skin breakdown will have a volume reduction of 50% by week 8 Date Initiated: 05/31/2023 Target Resolution Date: 07/31/2023 Goal Status: Active Ulcer/skin breakdown will have a volume reduction of 80% by week 12 Date Initiated: 05/31/2023 Target Resolution Date: 08/30/2023 Goal Status: Active Ulcer/skin breakdown will heal within 14 weeks Date Initiated: 05/31/2023 Target Resolution Date: 09/06/2023 Goal Status: Active Interventions: Assess patient/caregiver ability to obtain necessary supplies Assess patient/caregiver ability to perform ulcer/skin care regimen upon admission and as needed Assess ulceration(s) every visit Provide education on ulcer and skin care Notes: Electronic Signature(s) Signed: 06/09/2023 5:31:20 PM By: Robert Aver MSN RN CNS WTA Entered By: [name] on 06/08/2023 11:26:18 -------------------------------------------------------------------------------- Pain Assessment Details Patient Name: Date of Service: [name], [name]. 06/08/2023 1:15 PM Medical Record Number: [id number] Patient Account Number: [id number] Date of Birth/Sex: Treating RN: 08/28/1966 (57 y.o. [name] Primary Care [name]: [name] Other  Clinician: Referring [name]: Treating [name]/Extender: [name]: 1 Active Problems Location of Pain Severity and Description of Pain Patient Has Paino No Site Locations [place], [place] [name] ([id number]) [id number] Page 5 of 7 Pain Management and Medication Current Pain Management: Electronic Signature(s) Signed: 06/09/2023 5:31:20 PM By: Robert Aver MSN RN CNS WTA Entered By: [name] on 06/08/2023 10:26:05 -------------------------------------------------------------------------------- Patient/Caregiver Education Details Patient Name: Date of Service: [name], [name] 9/17/2024andnbsp1:15 PM Medical Record Number: [id number] Patient Account Number: [id number] Date of Birth/Gender: Treating RN: 25-Feb-1966 (57 y.o. [name] Primary Care Physician: [name] Other Clinician: Referring Physician: Treating Physician/Extender: [name]: 1 Education Assessment Education Provided To: Patient Education Topics Provided Wound/Skin Impairment: Handouts: Caring for Your Ulcer Methods: Explain/Verbal Responses: State content correctly Electronic Signature(s) Signed: 06/09/2023 5:31:20 PM By: Robert Aver MSN RN CNS WTA Entered By: [name] on 06/08/2023 11:27:37 [name] ([id number]) [id number] Page 6 of 7 -------------------------------------------------------------------------------- Wound Assessment Details Patient Name: Date of Service: [name], [name]. 06/08/2023 1:15 PM  Medical Record Number: [id number] Patient Account Number: [id number] Date of Birth/Sex: Treating RN: Sep 19, 1966 (57 y.o. [name] Primary Care Tyr Franca: [name] Other Clinician: Referring Shanea Karney: Treating Nahla Lukin/Extender: [name] Weeks in Treatment: 1 Wound Status Wound Number: 1 Primary  Etiology: Pressure Ulcer Wound Location: Right Calcaneus Wound Status: Open Wounding Event: Gradually Appeared Comorbid History: Hypertension Date Acquired: 05/20/2023 Weeks Of Treatment: 1 Clustered Wound: Yes Photos Wound Measurements Length: (cm) 7.7 Width: (cm) 4.5 Depth: (cm) 0.2 Area: (cm) 27.2 Volume: (cm) 5.44 % Reduction in Area: 23.2% % Reduction in Volume: 23.2% Epithelialization: None 14 3 Wound Description Classification: Category/Stage IV Exudate Amount: Medium Exudate Type: Serosanguineous Exudate Color: red, brown Foul Odor After Cleansing: No Slough/Fibrino No Wound Bed Granulation Amount: None Present (0%) Exposed Structure Necrotic Amount: Large (67-100%) Fascia Exposed: No Necrotic Quality: Eschar Fat Layer (Subcutaneous Tissue) Exposed: Yes Tendon Exposed: No Muscle Exposed: No Joint Exposed: No Bone Exposed: No Treatment Notes Wound #1 (Calcaneus) Wound Laterality: Right Cleanser Soap and Water Discharge Instruction: Use as directed. Peri-Wound Care [name], [name] ([id number]) [id number] Page 7 of 7 Topical Santyl Collagenase Ointment, 30 (gm), tube Discharge Instruction: apply nickel thick to wound bed only Primary Dressing Xeroform 4x4-HBD (in/in) Discharge Instruction: Apply Xeroform 4x4-HBD (in/in) as directed Secondary Dressing ABD Pad 5x9 (in/in) Discharge Instruction: Cover with ABD pad Secured With Kerlix Roll Sterile or Non-Sterile 6-ply 4.5x4 (yd/yd) Discharge Instruction: Apply Kerlix as directed Compression Wrap Compression Stockings Add-Ons Electronic Signature(s) Signed: 06/08/2023 4:21:43 PM By: Robert Aver MSN RN CNS WTA Entered By: [name] on 06/08/2023 13:21:43 -------------------------------------------------------------------------------- Vitals Details Patient Name: Date of Service: [name], [name]. 06/08/2023 1:15 PM Medical Record Number: [id number] Patient Account Number:  [id number] Date of Birth/Sex: Treating RN: Dec 08, 1965 (57 y.o. [name] Primary Care Jahniya Duzan: [name] Other Clinician: Referring Keshana Klemz: Treating Honorio Devol/Extender: [name]: 1 Vital Signs Time Taken: 13:24 Temperature (F): 98.2 Height (in): 73 Pulse (bpm): 70 Weight (lbs): 205 Respiratory Rate (breaths/min): 18 Body Mass Index (BMI): 27 Blood Pressure (mmHg): 144/90 Reference Range: 80 - 120 mg / dl Electronic Signature(s) Signed: 06/09/2023 5:31:20 PM By: Robert Aver MSN RN CNS WTA Entered By: [name] on 06/08/2023 10:25:32

## 2023-06-15 ENCOUNTER — Encounter: Payer: Managed Care, Other (non HMO) | Admitting: Physician Assistant

## 2023-06-15 DIAGNOSIS — L89610 Pressure ulcer of right heel, unstageable: Secondary | ICD-10-CM | POA: Diagnosis not present
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
## 2023-06-15 NOTE — Progress Notes (Signed)
Page 8 of 8 Cleanser Soap and Water Discharge Instruction: Use as directed. Peri-Wound Care Topical Santyl Collagenase Ointment, 30  (gm), tube Discharge Instruction: apply nickel thick to wound bed only Primary Dressing Xeroform 4x4-HBD (in/in) Discharge Instruction: Apply Xeroform 4x4-HBD (in/in) as directed Secondary Dressing ABD Pad 5x9 (in/in) Discharge Instruction: Cover with ABD pad Secured With Kerlix Roll Sterile or Non-Sterile 6-ply 4.5x4 (yd/yd) Discharge Instruction: Apply Kerlix as directed Compression Wrap Compression Stockings Add-Ons Electronic Signature(s) Signed: 06/15/2023 4:21:27 PM By: Midge Aver MSN RN CNS WTA Entered By: Midge Aver on 06/15/2023 13:09:25 -------------------------------------------------------------------------------- Vitals Details Patient Name: Date of Service: [name], [name] 06/15/2023 1:15 PM Medical Record Number: [id number] Patient Account Number: [id number] Date of Birth/Sex: Treating RN: 09/17/1966 (57 y.o. [name] Primary Care Eduardo Wurth: Eleanora Neighbor Other Clinician: Referring Lady Wisham: Treating Rudra Hobbins/Extender: Brandt Loosen in Treatment: 2 Vital Signs Time Taken: 13:00 Temperature (F): 98.0 Height (in): 73 Pulse (bpm): 72 Weight (lbs): 205 Respiratory Rate (breaths/min): 18 Body Mass Index (BMI): 27 Blood Pressure (mmHg): 163/92 Reference Range: 80 - 120 mg / dl Electronic Signature(s) Signed: 06/15/2023 4:21:27 PM By: Midge Aver MSN RN CNS WTA Entered By: Midge Aver on 06/15/2023 13:02:43  Page 8 of 8 Cleanser Soap and Water Discharge Instruction: Use as directed. Peri-Wound Care Topical Santyl Collagenase Ointment, 30  (gm), tube Discharge Instruction: apply nickel thick to wound bed only Primary Dressing Xeroform 4x4-HBD (in/in) Discharge Instruction: Apply Xeroform 4x4-HBD (in/in) as directed Secondary Dressing ABD Pad 5x9 (in/in) Discharge Instruction: Cover with ABD pad Secured With Kerlix Roll Sterile or Non-Sterile 6-ply 4.5x4 (yd/yd) Discharge Instruction: Apply Kerlix as directed Compression Wrap Compression Stockings Add-Ons Electronic Signature(s) Signed: 06/15/2023 4:21:27 PM By: Midge Aver MSN RN CNS WTA Entered By: Midge Aver on 06/15/2023 13:09:25 -------------------------------------------------------------------------------- Vitals Details Patient Name: Date of Service: [name], [name] 06/15/2023 1:15 PM Medical Record Number: [id number] Patient Account Number: [id number] Date of Birth/Sex: Treating RN: 09/17/1966 (57 y.o. [name] Primary Care Eduardo Wurth: Eleanora Neighbor Other Clinician: Referring Lady Wisham: Treating Rudra Hobbins/Extender: Brandt Loosen in Treatment: 2 Vital Signs Time Taken: 13:00 Temperature (F): 98.0 Height (in): 73 Pulse (bpm): 72 Weight (lbs): 205 Respiratory Rate (breaths/min): 18 Body Mass Index (BMI): 27 Blood Pressure (mmHg): 163/92 Reference Range: 80 - 120 mg / dl Electronic Signature(s) Signed: 06/15/2023 4:21:27 PM By: Midge Aver MSN RN CNS WTA Entered By: Midge Aver on 06/15/2023 13:02:43  and debridement process Treatment Activities: Apply topical anesthetic as ordered : 05/31/2023 Enzymatic debridement : 05/31/2023 Notes: Wound/Skin Impairment Nursing Diagnoses: Impaired tissue integrity Knowledge deficit related to ulceration/compromised skin integrity Goals: Patient/caregiver will verbalize understanding of skin care regimen Date Initiated: 05/31/2023 Target Resolution Date: 06/30/2023 Goal Status: Active Ulcer/skin breakdown will have a volume reduction of 30% by week 4 Date Initiated: 05/31/2023 Target Resolution Date: 06/30/2023 Goal Status: Active Ulcer/skin breakdown will have a volume reduction of 50% by week 8 Date Initiated: 05/31/2023 Target Resolution Date: 07/31/2023 Goal Status: Active Ulcer/skin breakdown will have a volume reduction of 80% by week 12 Date Initiated: 05/31/2023 Target Resolution Date: 08/30/2023 Goal Status: Active Ulcer/skin breakdown will heal within 14 weeks Date Initiated: 05/31/2023 Target Resolution Date: 09/06/2023 Goal Status:  Active Interventions: Assess patient/caregiver ability to obtain necessary supplies Assess patient/caregiver ability to perform ulcer/skin care regimen upon admission and as needed Assess ulceration(s) every visit Provide education on ulcer and skin care Notes: Electronic Signature(s) Signed: 06/15/2023 1:46:06 PM By: Midge Aver MSN RN CNS WTA Entered By: Midge Aver on 06/15/2023 13:46:06 -------------------------------------------------------------------------------- Pain Assessment Details Patient Name: Date of Service: [name], [name] 06/15/2023 1:15 PM Medical Record Number: [id number] Patient Account Number: [id number] AHREN, ROGERS ([id number]) [id number] Page 6 of 8 Date of Birth/Sex: Treating RN: [birth year] (57 y.o. [name] Primary Care Cristopher Ciccarelli: Other Clinician: Eleanora Neighbor Referring Shivali Quackenbush: Treating Sakiya Stepka/Extender: Brandt Loosen in Treatment: 2 Active Problems Location of Pain Severity and Description of Pain Patient Has Paino Yes Site Locations Rate the pain. Current Pain Level: 2 Pain Management and Medication Current Pain Management: Electronic Signature(s) Signed: 06/15/2023 4:21:27 PM By: Midge Aver MSN RN CNS WTA Entered By: Midge Aver on 06/15/2023 13:02:58 -------------------------------------------------------------------------------- Patient/Caregiver Education Details Patient Name: Date of Service: [name], Letha [place] 9/24/2024andnbsp1:15 PM Medical Record Number: [id number] Patient Account Number: [id number] Date of Birth/Gender: Treating RN: [birth year] (57 y.o. [name] Primary Care Physician: Eleanora Neighbor Other Clinician: Referring Physician: Treating Physician/Extender: Brandt Loosen in Treatment: 2 Education Assessment Education Provided To: Patient Education Topics Provided Wound/Skin Impairment: Handouts: Caring for Your  Ulcer Methods: Explain/Verbal Responses: State content correctly Electronic Signature(s) CAYDE, MASLEY T ([id number]) [id number] Page 7 of 8 Signed: 06/15/2023 4:21:27 PM By: Midge Aver MSN RN CNS WTA Entered By: Midge Aver on 06/15/2023 13:46:22 -------------------------------------------------------------------------------- Wound Assessment Details Patient Name: Date of Service: [name], [name] 06/15/2023 1:15 PM Medical Record Number: [id number] Patient Account Number: [id number] Date of Birth/Sex: Treating RN: Jan 12, 1966 (57 y.o. [name] Primary Care Sankalp Ferrell: Eleanora Neighbor Other Clinician: Referring Jonte Wollam: Treating Lilyth Lawyer/Extender: [name] Weeks in Treatment: 2 Wound Status Wound Number: 1 Primary Etiology: Pressure Ulcer Wound Location: Right Calcaneus Wound Status: Open Wounding Event: Gradually Appeared Comorbid History: Hypertension Date Acquired: 05/20/2023 Weeks Of Treatment: 2 Clustered Wound: Yes Photos Wound Measurements Length: (cm) 8 Width: (cm) 5.5 Depth: (cm) 0.2 Area: (cm) 34.558 Volume: (cm) 6.912 % Reduction in Area: 2.4% % Reduction in Volume: 2.4% Epithelialization: None Wound Description Classification: Category/Stage IV Exudate Amount: Medium Exudate Type: Serosanguineous Exudate Color: red, brown Foul Odor After Cleansing: No Slough/Fibrino No Wound Bed Granulation Amount: None Present (0%) Exposed Structure Necrotic Amount: Large (67-100%) Fascia Exposed: No Necrotic Quality: Eschar Fat Layer (Subcutaneous Tissue) Exposed: Yes Tendon Exposed: No Muscle Exposed: No Joint Exposed: No Bone Exposed: No Treatment Notes Wound #1 (Calcaneus) Wound Laterality: Right EDGAR, ALLEGRO T ([id number]) [id number]  Lillette Boxer T. 06/15/2023 1:15 PM Medical Record Number: [id number] Patient Account Number: [id number] Date of Birth/Sex: Treating RN: [birth year] (57 y.o. [name] Primary Care Caelen Reierson: Eleanora Neighbor Other Clinician: Referring Larie Mathes: Treating Nyna Chilton/Extender: Brandt Loosen in Treatment: 2 Encounter Discharge Information Items Post Procedure Vitals Discharge Condition: Stable Temperature (F): 98.0 SANTOSH, MESSIMER T ([id number]) [id number] Page 3 of 8 Ambulatory Status: Ambulatory Pulse (bpm): 72 Discharge Destination: Home Respiratory Rate (breaths/min): 18 Transportation: Private Auto Blood Pressure (mmHg): 162/93 Accompanied By: self Schedule Follow-up Appointment: Yes Clinical Summary of Care: Electronic Signature(s) Signed: 06/15/2023 4:21:27 PM By: Midge Aver MSN RN CNS WTA Entered By: Midge Aver on 06/15/2023 13:57:37 -------------------------------------------------------------------------------- Lower Extremity Assessment Details Patient Name: Date of Service: [name], [name] 06/15/2023 1:15 PM Medical Record Number: [id number] Patient Account Number: [id number] Date of Birth/Sex: Treating RN: August 12, 1966 (57 y.o. [name] Primary Care Davidson Palmieri: Eleanora Neighbor Other Clinician: Referring Tynisha Ogan: Treating Jozee Hammer/Extender: Brandt Loosen in Treatment: 2 Electronic Signature(s) Signed: 06/15/2023 4:21:27 PM By: Midge Aver MSN RN CNS WTA Entered By: Midge Aver on 06/15/2023 13:09:41 -------------------------------------------------------------------------------- Multi Wound Chart Details Patient Name: Date of Service: [name],  [name] 06/15/2023 1:15 PM Medical Record Number: [id number] Patient Account Number: [id number] Date of Birth/Sex: Treating RN: [birth year] (57 y.o. [name] Primary Care Jadin Creque: Eleanora Neighbor Other Clinician: Referring Desirey Keahey: Treating Mlissa Tamayo/Extender: Brandt Loosen in Treatment: 2 Vital Signs Height(in): 73 Pulse(bpm): 72 Weight(lbs): 205 Blood Pressure(mmHg): 163/92 Body Mass Index(BMI): 27 Temperature(F): 98.0 Respiratory Rate(breaths/min): 18 [1:Photos:] [N/A:N/A] Right Calcaneus N/A N/A Wound Location: Gradually Appeared N/A N/A Wounding Event: Pressure Ulcer N/A N/A Primary Etiology: Hypertension N/A N/A Comorbid History: 05/20/2023 N/A N/A Date Acquired: 2 N/A N/A Weeks of Treatment: Open N/A N/A Wound Status: No N/A N/A Wound Recurrence: Yes N/A N/A Clustered Wound: 8x5.5x0.2 N/A N/A Measurements L x W x D (cm) 34.558 N/A N/A A (cm) : rea 6.912 N/A N/A Volume (cm) : 2.40% N/A N/A % Reduction in A rea: 2.40% N/A N/A % Reduction in Volume: Category/Stage IV N/A N/A Classification: Medium N/A N/A Exudate A mount: Serosanguineous N/A N/A Exudate Type: red, brown N/A N/A Exudate Color: None Present (0%) N/A N/A Granulation A mount: Large (67-100%) N/A N/A Necrotic A mount: Eschar N/A N/A Necrotic Tissue: Fat Layer (Subcutaneous Tissue): Yes N/A N/A Exposed Structures: Fascia: No Tendon: No Muscle: No Joint: No Bone: No None N/A N/A Epithelialization: Treatment Notes Electronic Signature(s) Signed: 06/15/2023 1:45:55 PM By: Midge Aver MSN RN CNS WTA Entered By: Midge Aver on 06/15/2023 13:45:55 -------------------------------------------------------------------------------- Multi-Disciplinary Care Plan Details Patient Name: Date of Service: [name], [name] 06/15/2023 1:15 PM Medical Record Number: [id number] Patient Account Number: [id number] Date of Birth/Sex: Treating RN: 1966/03/02 (57  y.o. [name] Primary Care Medea Deines: Eleanora Neighbor Other Clinician: Referring Michaeleen Down: Treating Jalisia Puchalski/Extender: Brandt Loosen in Treatment: 2 Active Inactive Necrotic Tissue MARSTON, SHEWCHUK T ([id number]) [id number] Page 5 of 8 Nursing Diagnoses: Impaired tissue integrity related to necrotic/devitalized tissue Knowledge deficit related to management of necrotic/devitalized tissue Goals: Necrotic/devitalized tissue will be minimized in the wound bed Date Initiated: 05/31/2023 Target Resolution Date: 06/30/2023 Goal Status: Active Patient/caregiver will verbalize understanding of reason and process for debridement of necrotic tissue Date Initiated: 05/31/2023 Target Resolution Date: 06/30/2023 Goal Status: Active Interventions: Assess patient pain level pre-, during and post procedure and prior to discharge Provide education on necrotic tissue

## 2023-06-22 ENCOUNTER — Encounter: Payer: Managed Care, Other (non HMO) | Attending: Physician Assistant | Admitting: Physician Assistant

## 2023-06-22 DIAGNOSIS — I10 Essential (primary) hypertension: Secondary | ICD-10-CM | POA: Diagnosis not present

## 2023-06-22 DIAGNOSIS — L89610 Pressure ulcer of right heel, unstageable: Secondary | ICD-10-CM | POA: Diagnosis present

## 2023-06-22 NOTE — Progress Notes (Addendum)
Robert, [name] ([id number]) [id number] Page 1 of 8 Visit Report for 06/22/2023 Arrival Information Details Patient Name: Date of Service: Robert Cleveland T. 06/22/2023 1:15 PM Medical Record Number: [id number] Patient Account Number: [id number] Date of Birth/Sex: Treating RN: March 05, 1966 (57 y.o. [name] Primary Care [name]: Eleanora Neighbor Other Clinician: Referring Lindzie Boxx: Treating [name]/Extender: Brandt Loosen in Treatment: 3 Visit Information History Since Last Visit Added or deleted any medications: No Patient Arrived: Ambulatory Any new allergies or adverse reactions: No Arrival Time: 12:55 Has Dressing in Place as Prescribed: Yes Accompanied By: self Pain Present Now: No Transfer Assistance: None Patient Identification Verified: Yes Secondary Verification Process Completed: Yes Patient Requires Transmission-Based Precautions: No Patient Has Alerts: Yes Patient Alerts: Not diabetic Electronic Signature(s) Signed: 06/22/2023 4:57:44 PM By: Midge Aver MSN RN CNS WTA Entered By: Midge Aver on 06/22/2023 12:57:09 -------------------------------------------------------------------------------- Clinic Level of Care Assessment Details Patient Name: Date of Service: Robert Cleveland T. 06/22/2023 1:15 PM Medical Record Number: [id number] Patient Account Number: [id number] Date of Birth/Sex: Treating RN: Nov 08, 1965 (57 y.o. [name] Primary Care Lucile Didonato: Eleanora Neighbor Other Clinician: Referring Sholonda Jobst: Treating Nasir Bright/Extender: Brandt Loosen in Treatment: 3 Clinic Level of Care Assessment Items TOOL 1 Quantity Score []  - 0 Use when EandM and Procedure is performed on INITIAL visit ASSESSMENTS - Nursing Assessment / Reassessment []  - 0 General Physical Exam (combine w/ comprehensive assessment (listed just below) when performed on new pt. evals) []  -  0 Comprehensive Assessment (HX, ROS, Risk Assessments, Wounds Hx, etc.) ASSESSMENTS - Wound and Skin Assessment / Reassessment []  - 0 Dermatologic / Skin Assessment (not related to wound area) ASSESSMENTS - Ostomy and/or Continence Assessment and Care LINCON, SAHLIN ([id number]) [id number] Page 2 of 8 []  - 0 Incontinence Assessment and Management []  - 0 Ostomy Care Assessment and Management (repouching, etc.) PROCESS - Coordination of Care []  - 0 Simple Patient / Family Education for ongoing care []  - 0 Complex (extensive) Patient / Family Education for ongoing care []  - 0 Staff obtains Chiropractor, Records, T Results / Process Orders est []  - 0 Staff telephones HHA, Nursing Homes / Clarify orders / etc []  - 0 Routine Transfer to another Facility (non-emergent condition) []  - 0 Routine Hospital Admission (non-emergent condition) []  - 0 New Admissions / Manufacturing engineer / Ordering NPWT Apligraf, etc. , []  - 0 Emergency Hospital Admission (emergent condition) PROCESS - Special Needs []  - 0 Pediatric / Minor Patient Management []  - 0 Isolation Patient Management []  - 0 Hearing / Language / Visual special needs []  - 0 Assessment of Community assistance (transportation, D/C planning, etc.) []  - 0 Additional assistance / Altered mentation []  - 0 Support Surface(s) Assessment (bed, cushion, seat, etc.) INTERVENTIONS - Miscellaneous []  - 0 External ear exam []  - 0 Patient Transfer (multiple staff / Nurse, adult / Similar devices) []  - 0 Simple Staple / Suture removal (25 or less) []  - 0 Complex Staple / Suture removal (26 or more) []  - 0 Hypo/Hyperglycemic Management (do not check if billed separately) []  - 0 Ankle / Brachial Index (ABI) - do not check if billed separately Has the patient been seen at the hospital within the last three years: Yes Total Score: 0 Level Of Care: ____ Electronic Signature(s) Signed: 06/22/2023 4:57:44 PM  By: Midge Aver MSN RN CNS WTA Entered By: Midge Aver on 06/22/2023 13:21:30 -------------------------------------------------------------------------------- Encounter Discharge Information Details Patient Name: Date of Service: Robert  Lillette Boxer T. 06/22/2023 1:15 PM Medical Record Number: [id number] Patient Account Number: [id number] Date of Birth/Sex: Treating RN: Jul 19, 1966 (57 y.o. [name] Primary Care [name]: Eleanora Neighbor Other Clinician: Referring [name]: Treating [name]/Extender: Brandt Loosen in Treatment: 3 Encounter Discharge Information Items Post Procedure Vitals Discharge Condition: Stable Temperature (F): 98.3 [name], Robert T ([id number]) [id number] Page 3 of 8 Ambulatory Status: Ambulatory Pulse (bpm): 76 Discharge Destination: Home Respiratory Rate (breaths/min): 18 Transportation: Private Auto Blood Pressure (mmHg): 137/85 Accompanied By: self Schedule Follow-up Appointment: Yes Clinical Summary of Care: Electronic Signature(s) Signed: 06/22/2023 4:57:44 PM By: Midge Aver MSN RN CNS WTA Entered By: Midge Aver on 06/22/2023 13:23:08 -------------------------------------------------------------------------------- Lower Extremity Assessment Details Patient Name: Date of Service: [name], [name] 06/22/2023 1:15 PM Medical Record Number: [id number] Patient Account Number: [id number] Date of Birth/Sex: Treating RN: [birth year] (57 y.o. [name] Primary Care [name]: Eleanora Neighbor Other Clinician: Referring [name]: Treating [name]/Extender: Brandt Loosen in Treatment: 3 Electronic Signature(s) Signed: 06/22/2023 4:57:44 PM By: Midge Aver MSN RN CNS WTA Entered By: Midge Aver on 06/22/2023 13:09:19 -------------------------------------------------------------------------------- Multi Wound Chart Details Patient Name: Date of Service: [name],  [name] 06/22/2023 1:15 PM Medical Record Number: [id number] Patient Account Number: [id number] Date of Birth/Sex: Treating RN: [birth year] (57 y.o. [name] Primary Care [name]: Eleanora Neighbor Other Clinician: Referring [name]: Treating [name]/Extender: Brandt Loosen in Treatment: 3 Vital Signs Height(in): 73 Pulse(bpm): 76 Weight(lbs): 205 Blood Pressure(mmHg): 137/85 Body Mass Index(BMI): 27 Temperature(F): 98.3 Respiratory Rate(breaths/min): 18 [1:Photos:] [N/A:N/A] Right Calcaneus N/A N/A Wound Location: Gradually Appeared N/A N/A Wounding Event: Pressure Ulcer N/A N/A Primary Etiology: Hypertension N/A N/A Comorbid History: 05/20/2023 N/A N/A Date Acquired: 3 N/A N/A Weeks of Treatment: Open N/A N/A Wound Status: No N/A N/A Wound Recurrence: Yes N/A N/A Clustered Wound: 7.5x4.2x0.2 N/A N/A Measurements L x W x D (cm) 24.74 N/A N/A A (cm) : rea 4.948 N/A N/A Volume (cm) : 30.20% N/A N/A % Reduction in A rea: 30.20% N/A N/A % Reduction in Volume: Category/Stage IV N/A N/A Classification: Medium N/A N/A Exudate A mount: Serosanguineous N/A N/A Exudate Type: red, brown N/A N/A Exudate Color: None Present (0%) N/A N/A Granulation A mount: Large (67-100%) N/A N/A Necrotic A mount: Eschar N/A N/A Necrotic Tissue: Fat Layer (Subcutaneous Tissue): Yes N/A N/A Exposed Structures: Fascia: No Tendon: No Muscle: No Joint: No Bone: No None N/A N/A Epithelialization: Treatment Notes Electronic Signature(s) Signed: 06/22/2023 4:57:44 PM By: Midge Aver MSN RN CNS WTA Entered By: Midge Aver on 06/22/2023 13:11:25 -------------------------------------------------------------------------------- Multi-Disciplinary Care Plan Details Patient Name: Date of Service: [name], [name] 06/22/2023 1:15 PM Medical Record Number: [id number] Patient Account Number: [id number] Date of Birth/Sex: Treating RN: 1965/11/14 (57  y.o. [name] Primary Care Ravin Bendall: Eleanora Neighbor Other Clinician: Referring Briseida Gittings: Treating Daymian Lill/Extender: Brandt Loosen in Treatment: 3 Active Inactive Necrotic Tissue WRIGHT, GRAVELY T ([id number]) [id number] Page 5 of 8 Nursing Diagnoses: Impaired tissue integrity related to necrotic/devitalized tissue Knowledge deficit related to management of necrotic/devitalized tissue Goals: Necrotic/devitalized tissue will be minimized in the wound bed Date Initiated: 05/31/2023 Target Resolution Date: 06/30/2023 Goal Status: Active Patient/caregiver will verbalize understanding of reason and process for debridement of necrotic tissue Date Initiated: 05/31/2023 Target Resolution Date: 06/30/2023 Goal Status: Active Interventions: Assess patient pain level pre-, during and post procedure and prior to discharge Provide education on necrotic tissue  and debridement process Treatment Activities: Apply topical anesthetic as ordered : 05/31/2023 Enzymatic debridement : 05/31/2023 Notes: Wound/Skin Impairment Nursing Diagnoses: Impaired tissue integrity Knowledge deficit related to ulceration/compromised skin integrity Goals: Patient/caregiver will verbalize understanding of skin care regimen Date Initiated: 05/31/2023 Target Resolution Date: 06/30/2023 Goal Status: Active Ulcer/skin breakdown will have a volume reduction of 30% by week 4 Date Initiated: 05/31/2023 Target Resolution Date: 06/30/2023 Goal Status: Active Ulcer/skin breakdown will have a volume reduction of 50% by week 8 Date Initiated: 05/31/2023 Target Resolution Date: 07/31/2023 Goal Status: Active Ulcer/skin breakdown will have a volume reduction of 80% by week 12 Date Initiated: 05/31/2023 Target Resolution Date: 08/30/2023 Goal Status: Active Ulcer/skin breakdown will heal within 14 weeks Date Initiated: 05/31/2023 Target Resolution Date: 09/06/2023 Goal Status:  Active Interventions: Assess patient/caregiver ability to obtain necessary supplies Assess patient/caregiver ability to perform ulcer/skin care regimen upon admission and as needed Assess ulceration(s) every visit Provide education on ulcer and skin care Notes: Electronic Signature(s) Signed: 06/22/2023 4:57:44 PM By: Midge Aver MSN RN CNS WTA Entered By: Midge Aver on 06/22/2023 13:21:53 -------------------------------------------------------------------------------- Pain Assessment Details Patient Name: Date of Service: [name], [name] 06/22/2023 1:15 PM Medical Record Number: [id number] Patient Account Number: [id number] METRO, EDENFIELD ([id number]) [id number] Page 6 of 8 Date of Birth/Sex: Treating RN: 1965/10/20 (57 y.o. [name] Primary Care Fletcher Ostermiller: Other Clinician: Eleanora Neighbor Referring Kamar Callender: Treating Gerrett Loman/Extender: Brandt Loosen in Treatment: 3 Active Problems Location of Pain Severity and Description of Pain Patient Has Paino No Site Locations Pain Management and Medication Current Pain Management: Electronic Signature(s) Signed: 06/22/2023 4:57:44 PM By: Midge Aver MSN RN CNS WTA Entered By: Midge Aver on 06/22/2023 13:00:48 -------------------------------------------------------------------------------- Patient/Caregiver Education Details Patient Name: Date of Service: [name] 10/1/2024andnbsp1:15 PM Medical Record Number: [id number] Patient Account Number: [id number] Date of Birth/Gender: Treating RN: 1966-02-25 (57 y.o. [name] Primary Care Physician: Eleanora Neighbor Other Clinician: Referring Physician: Treating Physician/Extender: Brandt Loosen in Treatment: 3 Education Assessment Education Provided To: Patient Education Topics Provided Wound Debridement: Handouts: Wound Debridement Methods: Explain/Verbal Responses: State  content correctly Wound/Skin Impairment: Handouts: Caring for Your Ulcer Methods: Explain/Verbal HAMAD, WHYTE ([id number]) [id number] Page 7 of 8 Responses: State content correctly Electronic Signature(s) Signed: 06/22/2023 4:57:44 PM By: Midge Aver MSN RN CNS WTA Entered By: Midge Aver on 06/22/2023 13:22:10 -------------------------------------------------------------------------------- Wound Assessment Details Patient Name: Date of Service: [name], [name] 06/22/2023 1:15 PM Medical Record Number: [id number] Patient Account Number: [id number] Date of Birth/Sex: Treating RN: May 30, 1966 (57 y.o. [name] Primary Care Charda Janis: Eleanora Neighbor Other Clinician: Referring Deirdre Gryder: Treating Kaari Zeigler/Extender: [name] Weeks in Treatment: 3 Wound Status Wound Number: 1 Primary Etiology: Pressure Ulcer Wound Location: Right Calcaneus Wound Status: Open Wounding Event: Gradually Appeared Comorbid History: Hypertension Date Acquired: 05/20/2023 Weeks Of Treatment: 3 Clustered Wound: Yes Photos Wound Measurements Length: (cm) 7.5 Width: (cm) 4.2 Depth: (cm) 0.2 Area: (cm) 24.74 Volume: (cm) 4.948 % Reduction in Area: 30.2% % Reduction in Volume: 30.2% Epithelialization: None Wound Description Classification: Category/Stage IV Exudate Amount: Medium Exudate Type: Serosanguineous Exudate Color: red, brown Foul Odor After Cleansing: No Slough/Fibrino No Wound Bed Granulation Amount: None Present (0%) Exposed Structure Necrotic Amount: Large (67-100%) Fascia Exposed: No Necrotic Quality: Eschar Fat Layer (Subcutaneous Tissue) Exposed: Yes Tendon Exposed: No Muscle Exposed: No Joint Exposed: No Bone Exposed: No IAIN, SAWCHUK T ([id number]) [id number] Page 8 of 8  Treatment Notes Wound #1 (Calcaneus) Wound Laterality: Right Cleanser Soap and Water Discharge Instruction: Use as  directed. Peri-Wound Care Topical Santyl Collagenase Ointment, 30 (gm), tube Discharge Instruction: apply nickel thick to wound bed only Primary Dressing Xeroform 4x4-HBD (in/in) Discharge Instruction: Apply Xeroform 4x4-HBD (in/in) as directed Secondary Dressing ABD Pad 5x9 (in/in) Discharge Instruction: Cover with ABD pad Secured With Kerlix Roll Sterile or Non-Sterile 6-ply 4.5x4 (yd/yd) Discharge Instruction: Apply Kerlix as directed Compression Wrap Compression Stockings Add-Ons Electronic Signature(s) Signed: 06/22/2023 4:57:44 PM By: Midge Aver MSN RN CNS WTA Entered By: Midge Aver on 06/22/2023 13:08:58 -------------------------------------------------------------------------------- Vitals Details Patient Name: Date of Service: [name], [name] 06/22/2023 1:15 PM Medical Record Number: [id number] Patient Account Number: [id number] Date of Birth/Sex: Treating RN: 07/29/1966 (57 y.o. [name] Primary Care Alinna Siple: Eleanora Neighbor Other Clinician: Referring Tigerlily Christine: Treating Davius Goudeau/Extender: Brandt Loosen in Treatment: 3 Vital Signs Time Taken: 12:59 Temperature (F): 98.3 Height (in): 73 Pulse (bpm): 76 Weight (lbs): 205 Respiratory Rate (breaths/min): 18 Body Mass Index (BMI): 27 Blood Pressure (mmHg): 137/85 Reference Range: 80 - 120 mg / dl Electronic Signature(s) Signed: 06/22/2023 4:57:44 PM By: Midge Aver MSN RN CNS WTA Entered By: Midge Aver on 06/22/2023 13:00:41

## 2023-06-22 NOTE — Progress Notes (Addendum)
[name] ([id number]) [id number] Page 1 of 7 Visit Report for 06/22/2023 Chief Complaint Document Details Patient Name: Date of Service: Robert Cleveland T. 06/22/2023 1:15 PM Medical Record Number: [id number] Patient Account Number: [id number] Date of Birth/Sex: Treating RN: 02-Feb-1966 (57 y.o. [name] Primary Care Provider: Eleanora Neighbor Other Clinician: Referring Provider: Treating Provider/Extender: Brandt Loosen in Treatment: 3 Information Obtained from: Patient Chief Complaint Right heel ulcer Electronic Signature(s) Signed: 06/22/2023 12:51:59 PM By: Allen Derry PA-C Entered By: Allen Derry on 06/22/2023 09:51:59 -------------------------------------------------------------------------------- Debridement Details Patient Name: Date of Service: Robert [name], [name] 06/22/2023 1:15 PM Medical Record Number: [id number] Patient Account Number: [id number] Date of Birth/Sex: Treating RN: May 02, 1966 (57 y.o. [name] Primary Care Provider: Eleanora Neighbor Other Clinician: Referring Provider: Treating Provider/Extender: Brandt Loosen in Treatment: 3 Debridement Performed for Assessment: Wound #1 Right Calcaneus Performed By: Physician Allen Derry, PA-C Debridement Type: Debridement Level of Consciousness (Pre-procedure): Awake and Alert Pre-procedure Verification/Time Out Yes - 13:18 Taken: Start Time: 13:18 Pain Control: Lidocaine 4% T opical Solution Percent of Wound Bed Debrided: 100% T Area Debrided (cm): otal 24.73 Tissue and other material debrided: Viable, Non-Viable, Slough, Subcutaneous, Biofilm, Slough Level: Skin/Subcutaneous Tissue Debridement Description: Excisional Instrument: Curette Bleeding: Moderate Hemostasis Achieved: Pressure Procedural Pain: 0 Post Procedural Pain: 0 Response to Treatment: Procedure was tolerated well [name], [name] ([id number])  [id number] Page 2 of 7 Level of Consciousness (Post- Awake and Alert procedure): Post Debridement Measurements of Total Wound Length: (cm) 7.5 Stage: Category/Stage IV Width: (cm) 4.2 Depth: (cm) 0.2 Volume: (cm) 4.948 Character of Wound/Ulcer Post Debridement: Stable Post Procedure Diagnosis Same as Pre-procedure Electronic Signature(s) Signed: 06/22/2023 4:56:48 PM By: Allen Derry PA-C Signed: 06/22/2023 4:57:44 PM By: Midge Aver MSN RN CNS WTA Entered By: Midge Aver on 06/22/2023 10:19:53 -------------------------------------------------------------------------------- HPI Details Patient Name: Date of Service: Robert [name], [name] 06/22/2023 1:15 PM Medical Record Number: [id number] Patient Account Number: [id number] Date of Birth/Sex: Treating RN: 1966-02-15 (57 y.o. [name] Primary Care Provider: Eleanora Neighbor Other Clinician: Referring Provider: Treating Provider/Extender: Brandt Loosen in Treatment: 3 History of Present Illness HPI Description: 05-31-2023 patient presents today for initial evaluation here in the clinic concerning issues that he is having with an eschar over the right heel. Unfortunately this came on seemingly fairly suddenly when he was at work. He does work around [employer] but states he was not doing anything directly with them at that point. Again I am unsure exactly what went on here and to what degree the chemicals could have played a role in this but this almost looks to me more like damage secondary to a chemical burn versus a strict pressure ulcer although I am unsure to be certain. The issue here is simply that he has 3 areas that are somewhat separated by some skin in between although to some degree connected as well which is very necrotic and eschar covered and to be honest getting any dressing to this is not really going to be of benefit at this point. Fortunately I do not see any  signs of active infection at this time which is good news. With that being said I do think that this is gena be difficult to get heal until we get the eschar off of the area. The patient voiced understanding and his wife was present during the evaluation today as well. He does have hypertension but no  other major medical problems. T my knowledge this is not a workers o comp case although I am unsure whether or not it should be to be honest. 06-08-2023 upon evaluation today patient appears to be doing well currently in regard to his wound. He has been tolerating the dressing changes without complication. Fortunately there does not appear to be any signs of active infection locally or systemically which is great news. With that being said he still has a pretty much eschar covering he did get the Santyl after a bit of a fight although he only got 2 tubes this may not last a full month but hopefully we will need it the whole month and this will work out just fine for [place] will make do with what we can get. 06-15-2023 upon evaluation today patient's wound is actually starting to soften up as far as the eschar is concerned actually think we may be able to get some of that off today and I discussed that with the patient. He is in agreement with given the a shot to do this. 06-22-2023 upon evaluation today patient appears to be doing much better in regard to his heel actually feel like most of the wounds are actually making significant improvement here which is great news and are very pleased in that regard. I do not see any signs of active infection looking or systemically at this time which is great news and in general I do believe that we are making good headway towards complete closure. Electronic Signature(s) Signed: 06/22/2023 1:30:08 PM By: Allen Derry PA-C Entered By: Allen Derry on 06/22/2023 10:30:07 [name] ([id number]) [id number] Page 3 of  7 -------------------------------------------------------------------------------- Physical Exam Details Patient Name: Date of Service: [name]. 06/22/2023 1:15 PM Medical Record Number: [id number] Patient Account Number: [id number] Date of Birth/Sex: Treating RN: 03-06-1966 (57 y.o. [name] Primary Care Provider: Eleanora Neighbor Other Clinician: Referring Provider: Treating Provider/Extender: [name] Weeks in Treatment: 3 Constitutional Well-nourished and well-hydrated in no acute distress. Respiratory normal breathing without difficulty. Psychiatric this patient is able to make decisions and demonstrates good insight into disease process. Alert and Oriented x 3. pleasant and cooperative. Notes Upon inspection patient's wounds did require sharp ring and across the board I performed at all locations and the main heel ulcer is the one that was not able to do as much due to discomfort. Nonetheless we will continue with Santyl here. Electronic Signature(s) Signed: 06/22/2023 1:32:49 PM By: Allen Derry PA-C Entered By: Allen Derry on 06/22/2023 10:32:49 -------------------------------------------------------------------------------- Physician Orders Details Patient Name: Date of Service: Robert [name], [name] 06/22/2023 1:15 PM Medical Record Number: [id number] Patient Account Number: [id number] Date of Birth/Sex: Treating RN: 1966/01/14 (57 y.o. [name] Primary Care Provider: Eleanora Neighbor Other Clinician: Referring Provider: Treating Provider/Extender: Brandt Loosen in Treatment: 3 Verbal / Phone Orders: No Diagnosis Coding ICD-10 Coding Code Description L89.610 Pressure ulcer of right heel, unstageable I10 Essential (primary) hypertension Follow-up Appointments Return Appointment in 1 week. Bathing/ Shower/ Hygiene May shower; gently cleanse wound with antibacterial soap, rinse and pat dry prior  to dressing wounds WYATTE, DAMES ([id number]) [id number] Page 4 of 7 Anesthetic (Use 'Patient Medications' Section for Anesthetic Order Entry) Lidocaine applied to wound bed Wound Treatment Wound #1 - Calcaneus Wound Laterality: Right Cleanser: Soap and Water 1 x Per Day/30 Days Discharge Instructions: Use as directed. Topical: Santyl Collagenase Ointment, 30 (gm), tube 1 x Per  Day/30 Days Discharge Instructions: apply nickel thick to wound bed only Prim Dressing: Xeroform 4x4-HBD (in/in) (Generic) 1 x Per Day/30 Days ary Discharge Instructions: Apply Xeroform 4x4-HBD (in/in) as directed Secondary Dressing: ABD Pad 5x9 (in/in) (Generic) 1 x Per Day/30 Days Discharge Instructions: Cover with ABD pad Secured With: Kerlix Roll Sterile or Non-Sterile 6-ply 4.5x4 (yd/yd) (Generic) 1 x Per Day/30 Days Discharge Instructions: Apply Kerlix as directed Electronic Signature(s) Signed: 06/22/2023 4:56:48 PM By: Allen Derry PA-C Signed: 06/22/2023 4:57:44 PM By: Midge Aver MSN RN CNS WTA Entered By: Midge Aver on 06/22/2023 10:21:17 -------------------------------------------------------------------------------- Problem List Details Patient Name: Date of Service: Robert [name], [name] 06/22/2023 1:15 PM Medical Record Number: [id number] Patient Account Number: [id number] Date of Birth/Sex: Treating RN: May 07, 1966 (57 y.o. [name] Primary Care Provider: Eleanora Neighbor Other Clinician: Referring Provider: Treating Provider/Extender: Brandt Loosen in Treatment: 3 Active Problems ICD-10 Encounter Code Description Active Date MDM Diagnosis L89.610 Pressure ulcer of right heel, unstageable 05/31/2023 No Yes I10 Essential (primary) hypertension 05/31/2023 No Yes Inactive Problems Resolved Problems Electronic Signature(s) Signed: 06/22/2023 12:51:56 PM By: Allen Derry PA-C Entered By: Allen Derry on 06/22/2023 09:51:56 Margorie John  T ([id number]) [id number] Page 5 of 7 -------------------------------------------------------------------------------- Progress Note Details Patient Name: Date of Service: [name]. 06/22/2023 1:15 PM Medical Record Number: [id number] Patient Account Number: [id number] Date of Birth/Sex: Treating RN: 19-Nov-1965 (57 y.o. [name] Primary Care Provider: Eleanora Neighbor Other Clinician: Referring Provider: Treating Provider/Extender: Brandt Loosen in Treatment: 3 Subjective Chief Complaint Information obtained from Patient Right heel ulcer History of Present Illness (HPI) 05-31-2023 patient presents today for initial evaluation here in the clinic concerning issues that he is having with an eschar over the right heel. Unfortunately this came on seemingly fairly suddenly when he was at work. He does work around [employer] but states he was not doing anything directly with them at that point. Again I am unsure exactly what went on here and to what degree the chemicals could have played a role in this but this almost looks to me more like damage secondary to a chemical burn versus a strict pressure ulcer although I am unsure to be certain. The issue here is simply that he has 3 areas that are somewhat separated by some skin in between although to some degree connected as well which is very necrotic and eschar covered and to be honest getting any dressing to this is not really going to be of benefit at this point. Fortunately I do not see any signs of active infection at this time which is good news. With that being said I do think that this is gena be difficult to get heal until we get the eschar off of the area. The patient voiced understanding and his wife was present during the evaluation today as well. He does have hypertension but no other major medical problems. T my knowledge this is not a workers comp o case although I am  unsure whether or not it should be to be honest. 06-08-2023 upon evaluation today patient appears to be doing well currently in regard to his wound. He has been tolerating the dressing changes without complication. Fortunately there does not appear to be any signs of active infection locally or systemically which is great news. With that being said he still has a pretty much eschar covering he did get the Santyl after a bit of a fight although he  only got 2 tubes this may not last a full month but hopefully we will need it the whole month and this will work out just fine for [place] will make do with what we can get. 06-15-2023 upon evaluation today patient's wound is actually starting to soften up as far as the eschar is concerned actually think we may be able to get some of that off today and I discussed that with the patient. He is in agreement with given the a shot to do this. 06-22-2023 upon evaluation today patient appears to be doing much better in regard to his heel actually feel like most of the wounds are actually making significant improvement here which is great news and are very pleased in that regard. I do not see any signs of active infection looking or systemically at this time which is great news and in general I do believe that we are making good headway towards complete closure. Objective Constitutional Well-nourished and well-hydrated in no acute distress. Vitals Time Taken: 12:59 PM, Height: 73 in, Weight: 205 lbs, BMI: 27, Temperature: 98.3 F, Pulse: 76 bpm, Respiratory Rate: 18 breaths/min, Blood Pressure: 137/85 mmHg. Respiratory normal breathing without difficulty. Psychiatric this patient is able to make decisions and demonstrates good insight into disease process. Alert and Oriented x 3. pleasant and cooperative. General Notes: Upon inspection patient's wounds did require sharp ring and across the board I performed at all locations and the main heel ulcer is the one that was  not able to do as much due to discomfort. Nonetheless we will continue with Santyl here. Integumentary (Hair, Skin) Wound #1 status is Open. Original cause of wound was Gradually Appeared. The date acquired was: 05/20/2023. The wound has been in treatment 3 weeks. The wound is located on the Right Calcaneus. The wound measures 7.5cm length x 4.2cm width x 0.2cm depth; 24.74cm^2 area and 4.948cm^3 volume. There is Fat Layer (Subcutaneous Tissue) exposed. There is a medium amount of serosanguineous drainage noted. There is no granulation within the wound bed. There is a large (67-100%) amount of necrotic tissue within the wound bed including Eschar. [name], [name] ([id number]) [id number] Page 6 of 7 Assessment Active Problems ICD-10 Pressure ulcer of right heel, unstageable Essential (primary) hypertension Procedures Wound #1 Pre-procedure diagnosis of Wound #1 is a Pressure Ulcer located on the Right Calcaneus . There was a Excisional Skin/Subcutaneous Tissue Debridement with a total area of 24.73 sq cm performed by Allen Derry, PA-C. With the following instrument(s): Curette to remove Viable and Non-Viable tissue/material. Material removed includes Subcutaneous Tissue, Slough, and Biofilm after achieving pain control using Lidocaine 4% T opical Solution. No specimens were taken. A time out was conducted at 13:18, prior to the start of the procedure. A Moderate amount of bleeding was controlled with Pressure. The procedure was tolerated well with a pain level of 0 throughout and a pain level of 0 following the procedure. Post Debridement Measurements: 7.5cm length x 4.2cm width x 0.2cm depth; 4.948cm^3 volume. Post debridement Stage noted as Category/Stage IV. Character of Wound/Ulcer Post Debridement is stable. Post procedure Diagnosis Wound #1: Same as Pre-Procedure Plan Follow-up Appointments: Return Appointment in 1 week. Bathing/ Shower/ Hygiene: May shower;  gently cleanse wound with antibacterial soap, rinse and pat dry prior to dressing wounds Anesthetic (Use 'Patient Medications' Section for Anesthetic Order Entry): Lidocaine applied to wound bed WOUND #1: - Calcaneus Wound Laterality: Right Cleanser: Soap and Water 1 x Per Day/30 Days Discharge Instructions: Use as directed. Topical: Santyl  Collagenase Ointment, 30 (gm), tube 1 x Per Day/30 Days Discharge Instructions: apply nickel thick to wound bed only Prim Dressing: Xeroform 4x4-HBD (in/in) (Generic) 1 x Per Day/30 Days ary Discharge Instructions: Apply Xeroform 4x4-HBD (in/in) as directed Secondary Dressing: ABD Pad 5x9 (in/in) (Generic) 1 x Per Day/30 Days Discharge Instructions: Cover with ABD pad Secured With: Kerlix Roll Sterile or Non-Sterile 6-ply 4.5x4 (yd/yd) (Generic) 1 x Per Day/30 Days Discharge Instructions: Apply Kerlix as directed 1. I would recommend that we have the patient continue with the Santyl followed by Xeroform for now. 2. I am going to recommend the patient should continue to monitor for any signs of infection or worsening if anything changes he should let me know. 3. I would recommend specifically that he should be performing offloading which she still doing he is out of work at this point. Also come next week with the switch over to Iodoflex or collagen depending on how things stand since he will probably be out of the Santyl by that time. We will see patient back for reevaluation in 1 week here in the clinic. If anything worsens or changes patient will contact our office for additional recommendations. Electronic Signature(s) Signed: 06/22/2023 1:33:50 PM By: Allen Derry PA-C Entered By: Allen Derry on 06/22/2023 10:33:49 SuperBill Details -------------------------------------------------------------------------------- [name] ([id number]) [id number] Page 7 of 7 Patient Name: Date of Service: [name]  06/22/2023 Medical Record Number: [id number] Patient Account Number: [id number] Date of Birth/Sex: Treating RN: 01/13/1966 (57 y.o. [name] Primary Care Provider: Eleanora Neighbor Other Clinician: Referring Provider: Treating Provider/Extender: [name] Weeks in Treatment: 3 Diagnosis Coding ICD-10 Codes Code Description L89.610 Pressure ulcer of right heel, unstageable I10 Essential (primary) hypertension Facility Procedures CPT4 Code Description Modifier Quantity 78295621 11042 - DEB SUBQ TISSUE 20 SQ CM/< 1 ICD-10 Diagnosis Description L89.610 Pressure ulcer of right heel, unstageable [id number] 11045 - DEB SUBQ TISS EA ADDL 20CM 1 ICD-10 Diagnosis Description L89.610 Pressure ulcer of right heel, unstageable Physician Procedures Quantity CPT4 Code Description Modifier [id number] 11042 - WC PHYS SUBQ TISS 20 SQ CM 1 ICD-10 Diagnosis Description L89.610 Pressure ulcer of right heel, unstageable [id number] 11045 - WC PHYS SUBQ TISS EA ADDL 20 CM 1 ICD-10 Diagnosis Description L89.610 Pressure ulcer of right heel, unstageable Electronic Signature(s) Signed: 06/22/2023 1:34:05 PM By: Allen Derry PA-C Entered By: Allen Derry on 06/22/2023 10:34:05

## 2023-06-28 ENCOUNTER — Encounter: Payer: Managed Care, Other (non HMO) | Admitting: Physician Assistant

## 2023-06-28 DIAGNOSIS — L89610 Pressure ulcer of right heel, unstageable: Secondary | ICD-10-CM | POA: Diagnosis not present

## 2023-06-28 NOTE — Progress Notes (Signed)
[name] ([id number]) [id number] Page 1 of 9 Visit Report for 06/28/2023 Arrival Information Details Patient Name: Date of Service: [name]. 06/28/2023 1:15 PM Medical Record Number: [id number] Patient Account Number: [id number] Date of Birth/Sex: Treating RN: April 26, 1966 (57 y.o. [name] Primary Care Carlee Vonderhaar: Eleanora Neighbor Other Clinician: Referring Belen Pesch: Treating Delontae Lamm/Extender: [name]: 4 Visit Information History Since Last Visit Added or deleted any medications: No Patient Arrived: Ambulatory Any new allergies or adverse reactions: No Arrival Time: 12:55 Has Dressing in Place as Prescribed: Yes Accompanied By: wife Pain Present Now: No Transfer Assistance: None Patient Identification Verified: Yes Secondary Verification Process Completed: Yes Patient Requires Transmission-Based Precautions: No Patient Has Alerts: Yes Patient Alerts: Not diabetic Electronic Signature(s) Signed: 06/28/2023 1:43:34 PM By: Midge Aver MSN RN CNS WTA Previous Signature: 06/28/2023 1:12:28 PM Version By: Midge Aver MSN RN CNS WTA Entered By: Midge Aver on 06/28/2023 13:43:34 -------------------------------------------------------------------------------- Clinic Level of Care Assessment Details Patient Name: Date of Service: [name], [name]. 06/28/2023 1:15 PM Medical Record Number: [id number] Patient Account Number: [id number] Date of Birth/Sex: Treating RN: [birth year] (57 y.o. [name] Primary Care Chantalle Defilippo: Eleanora Neighbor Other Clinician: Referring Juliza Machnik: Treating Lakeysha Slutsky/Extender: [name]: 4 Clinic Level of Care Assessment Items TOOL 4 Quantity Score X- 1 0 Use when only an EandM is performed on FOLLOW-UP visit ASSESSMENTS - Nursing Assessment / Reassessment X- 1 10 Reassessment of Co-morbidities (includes updates in  patient status) X- 1 5 Reassessment of Adherence to Treatment Plan ASSESSMENTS - Wound and Skin A ssessment / Reassessment X - Simple Wound Assessment / Reassessment - one wound 1 5 [name] ([id number]) [id number] Page 2 of 9 []  - 0 Complex Wound Assessment / Reassessment - multiple wounds []  - 0 Dermatologic / Skin Assessment (not related to wound area) ASSESSMENTS - Focused Assessment []  - 0 Circumferential Edema Measurements - multi extremities []  - 0 Nutritional Assessment / Counseling / Intervention []  - 0 Lower Extremity Assessment (monofilament, tuning fork, pulses) []  - 0 Peripheral Arterial Disease Assessment (using hand held doppler) ASSESSMENTS - Ostomy and/or Continence Assessment and Care []  - 0 Incontinence Assessment and Management []  - 0 Ostomy Care Assessment and Management (repouching, etc.) PROCESS - Coordination of Care X - Simple Patient / Family Education for ongoing care 1 15 []  - 0 Complex (extensive) Patient / Family Education for ongoing care X- 1 10 Staff obtains Chiropractor, Records, [name] Results / Process Orders est []  - 0 Staff telephones HHA, Nursing Homes / Clarify orders / etc []  - 0 Routine Transfer to another Facility (non-emergent condition) []  - 0 Routine Hospital Admission (non-emergent condition) []  - 0 New Admissions / Manufacturing engineer / Ordering NPWT Apligraf, etc. , []  - 0 Emergency Hospital Admission (emergent condition) X- 1 10 Simple Discharge Coordination []  - 0 Complex (extensive) Discharge Coordination PROCESS - Special Needs []  - 0 Pediatric / Minor Patient Management []  - 0 Isolation Patient Management []  - 0 Hearing / Language / Visual special needs []  - 0 Assessment of Community assistance (transportation, D/C planning, etc.) []  - 0 Additional assistance / Altered mentation []  - 0 Support Surface(s) Assessment (bed, cushion, seat, etc.) INTERVENTIONS - Wound Cleansing /  Measurement X - Simple Wound Cleansing - one wound 1 5 []  - 0 Complex Wound Cleansing - multiple wounds X- 1 5 Wound Imaging (photographs - any number of wounds) []  - 0  Wound Tracing (instead of photographs) X- 1 5 Simple Wound Measurement - one wound []  - 0 Complex Wound Measurement - multiple wounds INTERVENTIONS - Wound Dressings []  - 0 Small Wound Dressing one or multiple wounds X- 1 15 Medium Wound Dressing one or multiple wounds []  - 0 Large Wound Dressing one or multiple wounds []  - 0 Application of Medications - topical []  - 0 Application of Medications - injection INTERVENTIONS - Miscellaneous []  - 0 External ear exam []  - 0 Specimen Collection (cultures, biopsies, blood, body fluids, etc.) []  - 0 Specimen(s) / Culture(s) sent or taken to Lab for analysis [name], [name] ([id number]) [id number] Page 3 of 9 []  - 0 Patient Transfer (multiple staff / Nurse, adult / Similar devices) []  - 0 Simple Staple / Suture removal (25 or less) []  - 0 Complex Staple / Suture removal (26 or more) []  - 0 Hypo / Hyperglycemic Management (close monitor of Blood Glucose) []  - 0 Ankle / Brachial Index (ABI) - do not check if billed separately X- 1 5 Vital Signs Has the patient been seen at the hospital within the last three years: Yes Total Score: 90 Level Of Care: New/Established - Level 3 Electronic Signature(s) Signed: 06/29/2023 5:02:18 PM By: Midge Aver MSN RN CNS WTA Entered By: Midge Aver on 06/28/2023 13:54:07 -------------------------------------------------------------------------------- Encounter Discharge Information Details Patient Name: Date of Service: [name], [name]. 06/28/2023 1:15 PM Medical Record Number: [id number] Patient Account Number: [id number] Date of Birth/Sex: Treating RN: [birth year] (57 y.o. [name] Primary Care Kinsly Hild: Eleanora Neighbor Other Clinician: Referring Yaviel Kloster: Treating Martin Smeal/Extender:  [name]: 4 Encounter Discharge Information Items Discharge Condition: Stable Ambulatory Status: Ambulatory Discharge Destination: Home Transportation: Private Auto Accompanied By: wife Schedule Follow-up Appointment: Yes Clinical Summary of Care: Electronic Signature(s) Signed: 06/28/2023 1:54:59 PM By: Midge Aver MSN RN CNS WTA Previous Signature: 06/28/2023 1:45:30 PM Version By: Midge Aver MSN RN CNS WTA Previous Signature: 06/28/2023 1:41:10 PM Version By: Midge Aver MSN RN CNS WTA Entered By: Midge Aver on 06/28/2023 13:54:59 -------------------------------------------------------------------------------- Lower Extremity Assessment Details Patient Name: Date of Service: [name], [name]. 06/28/2023 1:15 PM Medical Record Number: [id number] Patient Account Number: [id number] Date of Birth/Sex: Treating RN: Nov 09, 1965 (57 y.o. [name], [name], [name] ([id number]) [id number] Page 4 of 9 Primary Care Cyrah Mclamb: Eleanora Neighbor Other Clinician: Referring Eunie Lawn: Treating Vashawn Ekstein/Extender: [name]: 4 Electronic Signature(s) Signed: 06/29/2023 5:02:18 PM By: Midge Aver MSN RN CNS WTA Entered By: Midge Aver on 06/28/2023 13:08:25 -------------------------------------------------------------------------------- Multi Wound Chart Details Patient Name: Date of Service: [name], [name]. 06/28/2023 1:15 PM Medical Record Number: [id number] Patient Account Number: [id number] Date of Birth/Sex: Treating RN: Sep 03, 1966 (57 y.o. [name] Primary Care Aramis Zobel: Eleanora Neighbor Other Clinician: Referring Louden Houseworth: Treating Kassadi Presswood/Extender: [name]: 4 Vital Signs Height(in): 73 Pulse(bpm): 74 Weight(lbs): 205 Blood Pressure(mmHg): 151/79 Body Mass Index(BMI): 27 Temperature(F): 98.7 Respiratory  Rate(breaths/min): 18 [1:Photos:] [N/A:N/A] Right Calcaneus N/A N/A Wound Location: Gradually Appeared N/A N/A Wounding Event: Pressure Ulcer N/A N/A Primary Etiology: Hypertension N/A N/A Comorbid History: 05/20/2023 N/A N/A Date Acquired: 4 N/A N/A [name] of Treatment: Open N/A N/A Wound Status: STATON, MARKEY ([id number]) [id number] Page 5 of 9 No N/A N/A Wound Recurrence: Yes N/A N/A Clustered Wound: 7.5x4.1x0.2 N/A N/A Measurements L x W x D (cm) 24.151 N/A N/A A (cm) : rea 4.83 N/A  N/A Volume (cm) : 31.80% N/A N/A % Reduction in A rea: 31.80% N/A N/A % Reduction in Volume: Category/Stage IV N/A N/A Classification: Medium N/A N/A Exudate A mount: Serosanguineous N/A N/A Exudate Type: red, brown N/A N/A Exudate Color: None Present (0%) N/A N/A Granulation A mount: Large (67-100%) N/A N/A Necrotic A mount: Eschar N/A N/A Necrotic Tissue: Fat Layer (Subcutaneous Tissue): Yes N/A N/A Exposed Structures: Fascia: No Tendon: No Muscle: No Joint: No Bone: No None N/A N/A Epithelialization: Treatment Notes Electronic Signature(s) Signed: 06/29/2023 5:02:18 PM By: Midge Aver MSN RN CNS WTA Entered By: Midge Aver on 06/28/2023 13:08:54 -------------------------------------------------------------------------------- Multi-Disciplinary Care Plan Details Patient Name: Date of Service: [name], [name]. 06/28/2023 1:15 PM Medical Record Number: [id number] Patient Account Number: [id number] Date of Birth/Sex: Treating RN: Jan 30, 1966 (57 y.o. [name] Primary Care Festus Pursel: Eleanora Neighbor Other Clinician: Referring Eli Pattillo: Treating Devian Bartolomei/Extender: [name]: 4 Active Inactive Necrotic Tissue Nursing Diagnoses: Impaired tissue integrity related to necrotic/devitalized tissue Knowledge deficit related to management of necrotic/devitalized  tissue Goals: Necrotic/devitalized tissue will be minimized in the wound bed Date Initiated: 05/31/2023 Date Inactivated: 06/28/2023 Target Resolution Date: 06/30/2023 Goal Status: Met Patient/caregiver will verbalize understanding of reason and process for debridement of necrotic tissue Date Initiated: 05/31/2023 Target Resolution Date: 07/22/2023 Goal Status: Active Interventions: Assess patient pain level pre-, during and post procedure and prior to discharge Provide education on necrotic tissue and debridement process Treatment Activities: Apply topical anesthetic as ordered : 05/31/2023 Enzymatic debridement : 05/31/2023 [name] ([id number]) [phone].[id number] Page 6 of 9 Notes: Wound/Skin Impairment Nursing Diagnoses: Impaired tissue integrity Knowledge deficit related to ulceration/compromised skin integrity Goals: Patient/caregiver will verbalize understanding of skin care regimen Date Initiated: 05/31/2023 Date Inactivated: 06/28/2023 Target Resolution Date: 06/30/2023 Goal Status: Met Ulcer/skin breakdown will have a volume reduction of 30% by week 4 Date Initiated: 05/31/2023 Date Inactivated: 06/28/2023 Target Resolution Date: 06/30/2023 Goal Status: Met Ulcer/skin breakdown will have a volume reduction of 50% by week 8 Date Initiated: 05/31/2023 Target Resolution Date: 07/31/2023 Goal Status: Active Ulcer/skin breakdown will have a volume reduction of 80% by week 12 Date Initiated: 05/31/2023 Target Resolution Date: 08/30/2023 Goal Status: Active Ulcer/skin breakdown will heal within 14 [name] Date Initiated: 05/31/2023 Target Resolution Date: 09/06/2023 Goal Status: Active Interventions: Assess patient/caregiver ability to obtain necessary supplies Assess patient/caregiver ability to perform ulcer/skin care regimen upon admission and as needed Assess ulceration(s) every visit Provide education on ulcer and skin care Notes: Electronic  Signature(s) Signed: 06/28/2023 1:54:22 PM By: Midge Aver MSN RN CNS WTA Entered By: Midge Aver on 06/28/2023 13:54:21 -------------------------------------------------------------------------------- Pain Assessment Details Patient Name: Date of Service: [name], [name]. 06/28/2023 1:15 PM Medical Record Number: [id number] Patient Account Number: [id number] Date of Birth/Sex: Treating RN: [birth year] (57 y.o. [name] Primary Care Natha Guin: Eleanora Neighbor Other Clinician: Referring Kevina Piloto: Treating Cassadi Purdie/Extender: [name]: 4 Active Problems Location of Pain Severity and Description of Pain Patient Has Paino No Site Locations [place], [place] [name] ([id number]) [id number] Page 7 of 9 Pain Management and Medication Current Pain Management: Electronic Signature(s) Signed: 06/28/2023 1:43:44 PM By: Midge Aver MSN RN CNS WTA Entered By: Midge Aver on 06/28/2023 13:43:44 -------------------------------------------------------------------------------- Patient/Caregiver Education Details Patient Name: Date of Service: Robert [name] 10/7/2024andnbsp1:15 PM Medical Record Number: [id number] Patient Account Number: [id number] Date of Birth/Gender: Treating RN: 1965/11/02 (57 y.o. [name] Primary Care Physician: Orson Aloe,  Christiane Ha Other Clinician: Referring Physician: Treating Physician/Extender: [name]: 4 Education Assessment Education Provided To: Patient Education Topics Provided Wound/Skin Impairment: Handouts: Caring for Your Ulcer Methods: Explain/Verbal Responses: State content correctly Electronic Signature(s) Signed: 06/29/2023 5:02:18 PM By: Midge Aver MSN RN CNS WTA Entered By: Midge Aver on 06/28/2023 13:54:26 [name] ([id number]) [id number] Page 8 of  9 -------------------------------------------------------------------------------- Wound Assessment Details Patient Name: Date of Service: Robert Mulligan. 06/28/2023 1:15 PM Medical Record Number: [id number] Patient Account Number: [id number] Date of Birth/Sex: Treating RN: 01-Apr-1966 (57 y.o. [name] Primary Care Corinna Burkman: Eleanora Neighbor Other Clinician: Referring Ilir Mahrt: Treating Robert Vorce/Extender: [name]: 4 Wound Status Wound Number: 1 Primary Etiology: Pressure Ulcer Wound Location: Right Calcaneus Wound Status: Open Wounding Event: Gradually Appeared Comorbid History: Hypertension Date Acquired: 05/20/2023 [name] Of Treatment: 4 Clustered Wound: Yes Photos Wound Measurements Length: (cm) 7.5 Width: (cm) 4.1 Depth: (cm) 0.2 Area: (cm) 24.1 Volume: (cm) 4.83 % Reduction in Area: 31.8% % Reduction in Volume: 31.8% Epithelialization: None 51 Wound Description Classification: Category/Stage IV Exudate Amount: Medium Exudate Type: Serosanguineous Exudate Color: red, brown Foul Odor After Cleansing: No Slough/Fibrino No Wound Bed Granulation Amount: None Present (0%) Exposed Structure Necrotic Amount: Large (67-100%) Fascia Exposed: No Necrotic Quality: Eschar Fat Layer (Subcutaneous Tissue) Exposed: Yes Tendon Exposed: No Muscle Exposed: No Joint Exposed: No Bone Exposed: No Treatment Notes Wound #1 (Calcaneus) Wound Laterality: Right Cleanser Soap and Water Discharge Instruction: Use as directed. Peri-Wound Care RAESHAUN, SIMSON [name] ([id number]) [id number] Page 9 of 9 Topical Primary Dressing Gauze Discharge Instruction: As directed: dry, moistened with saline or moistened with Dakins Solution IODOFLEX 0.9% Cadexomer Iodine Pad Discharge Instruction: Apply Iodoflex to wound bed only as directed. Secondary Dressing ABD Pad 5x9 (in/in) Discharge Instruction: Cover with ABD  pad Secured With Kerlix Roll Sterile or Non-Sterile 6-ply 4.5x4 (yd/yd) Discharge Instruction: Apply Kerlix as directed Compression Wrap Compression Stockings Add-Ons Electronic Signature(s) Signed: 06/29/2023 5:02:18 PM By: Midge Aver MSN RN CNS WTA Entered By: Midge Aver on 06/28/2023 13:07:13 -------------------------------------------------------------------------------- Vitals Details Patient Name: Date of Service: [name], [name]. 06/28/2023 1:15 PM Medical Record Number: [id number] Patient Account Number: [id number] Date of Birth/Sex: Treating RN: [birth year] (57 y.o. [name] Primary Care Jaidah Lomax: Eleanora Neighbor Other Clinician: Referring Keyoni Lapinski: Treating Latandra Loureiro/Extender: [name]: 4 Vital Signs Time Taken: 12:59 Temperature (F): 98.7 Height (in): 73 Pulse (bpm): 74 Weight (lbs): 205 Respiratory Rate (breaths/min): 18 Body Mass Index (BMI): 27 Blood Pressure (mmHg): 151/79 Reference Range: 80 - 120 mg / dl Electronic Signature(s) Signed: 06/28/2023 1:43:37 PM By: Midge Aver MSN RN CNS WTA Entered By: Midge Aver on 06/28/2023 13:43:37

## 2023-06-28 NOTE — Progress Notes (Addendum)
Robert [name] ([id number]) [id number] Page 1 of 6 Visit Report for 06/28/2023 Chief Complaint Document Details Patient Name: Date of Service: Robert [name]. 06/28/2023 1:15 PM Medical Record Number: [id number] Patient Account Number: [id number] Date of Birth/Sex: Treating RN: 09-Jun-1966 (57 y.o. Robert [name] Primary Care Provider: Eleanora [name] Other Clinician: Referring Provider: Treating Provider/Extender: Robert [name]: 4 Information Obtained from: Patient Chief Complaint Right heel ulcer Electronic Signature(s) Signed: 06/28/2023 3:41:41 PM By: Robert Derry PA-C Previous Signature: 06/28/2023 12:49:35 PM Version By: Robert Derry PA-C Entered By: Robert [name] on 06/28/2023 13:22:55 -------------------------------------------------------------------------------- HPI Details Patient Name: Date of Service: Robert [name], Robert [name]. 06/28/2023 1:15 PM Medical Record Number: [id number] Patient Account Number: [id number] Date of Birth/Sex: Treating RN: August 02, 1966 (57 y.o. Robert [name] Primary Care Provider: Eleanora [name] Other Clinician: Referring Provider: Treating Provider/Extender: Robert [name]: 4 History of Present Illness HPI Description: 05-31-2023 patient presents today for initial evaluation here in the clinic concerning issues that he is having with an eschar over the right heel. Unfortunately this came on seemingly fairly suddenly when he was at work. He does work around [employer] but states he was not doing anything directly with them at that point. Again I am unsure exactly what went on here and to what degree the chemicals could have played a role in this but this almost looks to me more like damage secondary to a chemical burn versus a strict pressure ulcer although I am unsure to be certain. The issue here is simply that he has 3 areas that are somewhat  separated by some skin in between although to some degree connected as well which is very necrotic and eschar covered and to be honest getting any dressing to this is not really going to be of benefit at this point. Fortunately I do not see any signs of active infection at this time which is good news. With that being said I do think that this is gena be difficult to get heal until we get the eschar off of the area. The patient voiced understanding and his wife was present during the evaluation today as well. He does have hypertension but no other major medical problems. [name] my knowledge this is not a workers o comp case although I am unsure whether or not it should be to be honest. 06-08-2023 upon evaluation today patient appears to be doing well currently in regard to his wound. He has been tolerating the dressing changes without complication. Fortunately there does not appear to be any signs of active infection locally or systemically which is great news. With that being said he still has a pretty much eschar covering he did get the Santyl after a bit of a fight although he only got 2 tubes this may not last a full month but hopefully we will need it the whole month and this will work out just fine for [place] will make do with what we can get. 06-15-2023 upon evaluation today patient's wound is actually starting to soften up as far as the eschar is concerned actually think we may be able to get some [name], [name] [name] ([id number]) [id number] Page 2 of 6 of that off today and I discussed that with the patient. He is in agreement with given the a shot to do this. 06-22-2023 upon evaluation today patient appears to be doing much better in regard to his heel actually feel like most of  the wounds are actually making significant improvement here which is great news and are very pleased in that regard. I do not see any signs of active infection looking or systemically at this time which is  great news and in general I do believe that we are making good headway towards complete closure. 10-7 24 upon evaluation today patient appears to be doing well currently in regard to his wound. Again the heel is showing signs of excellent improvement and actually very pleased with where we stand I do believe that he is doing quite well currently. Electronic Signature(s) Signed: 06/28/2023 1:49:28 PM By: Robert Derry PA-C Entered By: Robert [name] on 06/28/2023 13:49:28 -------------------------------------------------------------------------------- Physical Exam Details Patient Name: Date of Service: Robert Lillette Boxer [name]. 06/28/2023 1:15 PM Medical Record Number: [id number] Patient Account Number: [id number] Date of Birth/Sex: Treating RN: 16-Dec-1965 (57 y.o. Robert [name] Primary Care Provider: Eleanora [name] Other Clinician: Referring Provider: Treating Provider/Extender: [name] Weeks in Treatment: 4 Constitutional Well-nourished and well-hydrated in no acute distress. Respiratory normal breathing without difficulty. Psychiatric this patient is able to make decisions and demonstrates good insight into disease process. Alert and Oriented x 3. pleasant and cooperative. Notes Upon inspection patient's wound bed actually showed signs of doing much better the heel is showing signs of improvement and I did not perform any debridement today as I feel like we need to soften up some of the necrotic tissue a little bit better he voiced understanding we will get a switch from the Santyl to Iodoflex at this point as he is pretty much about out of the Santyl. Electronic Signature(s) Signed: 06/28/2023 1:49:51 PM By: Robert Derry PA-C Entered By: Robert [name] on 06/28/2023 13:49:50 -------------------------------------------------------------------------------- Physician Orders Details Patient Name: Date of Service: Robert [name], Robert [name]. 06/28/2023 1:15 PM Medical Record  Number: [id number] Patient Account Number: [id number] Date of Birth/Sex: Treating RN: 12-02-1965 (57 y.o. Robert [name] Primary Care Provider: Eleanora [name] Other Clinician: Referring Provider: Treating Provider/Extender: Robert [name]: 4 [place], [place] [name] ([id number]) [id number] Page 3 of 6 Verbal / Phone Orders: No Diagnosis Coding ICD-10 Coding Code Description L89.610 Pressure ulcer of right heel, unstageable I10 Essential (primary) hypertension Follow-up Appointments Return Appointment in 1 week. Bathing/ Shower/ Hygiene May shower; gently cleanse wound with antibacterial soap, rinse and pat dry prior to dressing wounds Anesthetic (Use 'Patient Medications' Section for Anesthetic Order Entry) Lidocaine applied to wound bed Wound Treatment Wound #1 - Calcaneus Wound Laterality: Right Cleanser: Soap and Water 3 x Per Week/30 Days Discharge Instructions: Use as directed. Prim Dressing: Gauze 3 x Per Week/30 Days ary Discharge Instructions: As directed: dry, moistened with saline or moistened with Dakins Solution Prim Dressing: IODOFLEX 0.9% Cadexomer Iodine Pad 3 x Per Week/30 Days ary Discharge Instructions: Apply Iodoflex to wound bed only as directed. Secondary Dressing: ABD Pad 5x9 (in/in) 3 x Per Week/30 Days Discharge Instructions: Cover with ABD pad Secured With: Kerlix Roll Sterile or Non-Sterile 6-ply 4.5x4 (yd/yd) (DME) (Generic) 3 x Per Week/30 Days Discharge Instructions: Apply Kerlix as directed Electronic Signature(s) Signed: 06/28/2023 1:44:32 PM By: Robert Aver MSN RN CNS WTA Signed: 06/28/2023 3:41:41 PM By: Robert Derry PA-C Previous Signature: 06/28/2023 1:43:29 PM Version By: Robert Aver MSN RN CNS WTA Entered By: Robert [name] on 06/28/2023 13:44:32 -------------------------------------------------------------------------------- Problem List Details Patient Name: Date of Service: [name], Robert [name]. 06/28/2023 1:15 PM Medical Record Number: [id number] Patient Account Number: [id number]  Date of Birth/Sex: Treating RN: 02-Dec-1965 (57 y.o. Robert [name] Primary Care Provider: Eleanora [name] Other Clinician: Referring Provider: Treating Provider/Extender: Robert [name]: 4 Active Problems ICD-10 Encounter Code Description Active Date MDM Diagnosis L89.610 Pressure ulcer of right heel, unstageable 05/31/2023 No Yes SUEDE, GREENAWALT [name] ([id number]) [id number] Page 4 of 6 I10 Essential (primary) hypertension 05/31/2023 No Yes Inactive Problems Resolved Problems Electronic Signature(s) Signed: 06/28/2023 1:54:33 PM By: Robert Aver MSN RN CNS WTA Signed: 06/28/2023 3:41:41 PM By: Robert Derry PA-C Previous Signature: 06/28/2023 12:49:31 PM Version By: Robert Derry PA-C Entered By: Robert [name] on 06/28/2023 13:54:33 -------------------------------------------------------------------------------- Progress Note Details Patient Name: Date of Service: Robert [name], Robert [name]. 06/28/2023 1:15 PM Medical Record Number: [id number] Patient Account Number: [id number] Date of Birth/Sex: Treating RN: [birth year] (57 y.o. Robert [name] Primary Care Provider: Eleanora [name] Other Clinician: Referring Provider: Treating Provider/Extender: Robert [name]: 4 Subjective Chief Complaint Information obtained from Patient Right heel ulcer History of Present Illness (HPI) 05-31-2023 patient presents today for initial evaluation here in the clinic concerning issues that he is having with an eschar over the right heel. Unfortunately this came on seemingly fairly suddenly when he was at work. He does work around [employer] but states he was not doing anything directly with them at that point. Again I am unsure exactly what went on here and to what degree the chemicals could have played a role in this  but this almost looks to me more like damage secondary to a chemical burn versus a strict pressure ulcer although I am unsure to be certain. The issue here is simply that he has 3 areas that are somewhat separated by some skin in between although to some degree connected as well which is very necrotic and eschar covered and to be honest getting any dressing to this is not really going to be of benefit at this point. Fortunately I do not see any signs of active infection at this time which is good news. With that being said I do think that this is gena be difficult to get heal until we get the eschar off of the area. The patient voiced understanding and his wife was present during the evaluation today as well. He does have hypertension but no other major medical problems. [name] my knowledge this is not a workers comp o case although I am unsure whether or not it should be to be honest. 06-08-2023 upon evaluation today patient appears to be doing well currently in regard to his wound. He has been tolerating the dressing changes without complication. Fortunately there does not appear to be any signs of active infection locally or systemically which is great news. With that being said he still has a pretty much eschar covering he did get the Santyl after a bit of a fight although he only got 2 tubes this may not last a full month but hopefully we will need it the whole month and this will work out just fine for [place] will make do with what we can get. 06-15-2023 upon evaluation today patient's wound is actually starting to soften up as far as the eschar is concerned actually think we may be able to get some of that off today and I discussed that with the patient. He is in agreement with given the a shot to do this. 06-22-2023 upon evaluation today patient appears to be doing much better in regard to his heel actually  feel like most of the wounds are actually making significant improvement here which is great news  and are very pleased in that regard. I do not see any signs of active infection looking or systemically at this time which is great news and in general I do believe that we are making good headway towards complete closure. 10-7 24 upon evaluation today patient appears to be doing well currently in regard to his wound. Again the heel is showing signs of excellent improvement and actually very pleased with where we stand I do believe that he is doing quite well currently. Objective Constitutional Robert [name], Robert [name] ([id number]) [id number] Page 5 of 6 Well-nourished and well-hydrated in no acute distress. Vitals Time Taken: 12:59 PM, Height: 73 in, Weight: 205 lbs, BMI: 27, Temperature: 98.7 F, Pulse: 74 bpm, Respiratory Rate: 18 breaths/min, Blood Pressure: 151/79 mmHg. Respiratory normal breathing without difficulty. Psychiatric this patient is able to make decisions and demonstrates good insight into disease process. Alert and Oriented x 3. pleasant and cooperative. General Notes: Upon inspection patient's wound bed actually showed signs of doing much better the heel is showing signs of improvement and I did not perform any debridement today as I feel like we need to soften up some of the necrotic tissue a little bit better he voiced understanding we will get a switch from the Santyl to Iodoflex at this point as he is pretty much about out of the Santyl. Integumentary (Hair, Skin) Wound #1 status is Open. Original cause of wound was Gradually Appeared. The date acquired was: 05/20/2023. The wound has been in treatment 4 weeks. The wound is located on the Right Calcaneus. The wound measures 7.5cm length x 4.1cm width x 0.2cm depth; 24.151cm^2 area and 4.83cm^3 volume. There is Fat Layer (Subcutaneous Tissue) exposed. There is a medium amount of serosanguineous drainage noted. There is no granulation within the wound bed. There is a large (67-100%) amount of necrotic tissue  within the wound bed including Eschar. Assessment Active Problems ICD-10 Pressure ulcer of right heel, unstageable Essential (primary) hypertension Plan Follow-up Appointments: Return Appointment in 1 week. Bathing/ Shower/ Hygiene: May shower; gently cleanse wound with antibacterial soap, rinse and pat dry prior to dressing wounds Anesthetic (Use 'Patient Medications' Section for Anesthetic Order Entry): Lidocaine applied to wound bed WOUND #1: - Calcaneus Wound Laterality: Right Cleanser: Soap and Water 3 x Per Week/30 Days Discharge Instructions: Use as directed. Prim Dressing: Gauze 3 x Per Week/30 Days ary Discharge Instructions: As directed: dry, moistened with saline or moistened with Dakins Solution Prim Dressing: IODOFLEX 0.9% Cadexomer Iodine Pad 3 x Per Week/30 Days ary Discharge Instructions: Apply Iodoflex to wound bed only as directed. Secondary Dressing: ABD Pad 5x9 (in/in) 3 x Per Week/30 Days Discharge Instructions: Cover with ABD pad Secured With: Kerlix Roll Sterile or Non-Sterile 6-ply 4.5x4 (yd/yd) (DME) (Generic) 3 x Per Week/30 Days Discharge Instructions: Apply Kerlix as directed 1. I would recommend that we have the patient continue to monitor for any signs of infection or worsening. Based on what I am seeing I do believe that we are making headway towards closure which is good news. 2. I am going to recommend as well the patient should use the Iodoflex over the next week and we will see where things stand. 3 we will continue with ABD pad followed by the roll gauze to secure in place. We will see patient back for reevaluation in 1 week here in the clinic. If anything worsens or changes patient  will contact our office for additional recommendations. Electronic Signature(s) Signed: 06/28/2023 2:01:40 PM By: Robert Derry PA-C Previous Signature: 06/28/2023 1:50:08 PM Version By: Robert Derry PA-C Entered By: Robert [name] on 06/28/2023 14:01:40 Robert [name]  ([id number]) [id number] Page 6 of 6 -------------------------------------------------------------------------------- SuperBill Details Patient Name: Date of Service: Robert [name]. 06/28/2023 Medical Record Number: [id number] Patient Account Number: [id number] Date of Birth/Sex: Treating RN: 09/28/1965 (57 y.o. Robert [name] Primary Care Provider: Eleanora [name] Other Clinician: Referring Provider: Treating Provider/Extender: Robert [name]: 4 Diagnosis Coding ICD-10 Codes Code Description L89.610 Pressure ulcer of right heel, unstageable I10 Essential (primary) hypertension Facility Procedures : CPT4 Code: [id number] Description: 99213 - WOUND CARE VISIT-LEV 3 EST PT Modifier: Quantity: 1 Physician Procedures : CPT4 Code Description Modifier [id number] 99213 - WC PHYS LEVEL 3 - EST PT ICD-10 Diagnosis Description L89.610 Pressure ulcer of right heel, unstageable I10 Essential (primary) hypertension Quantity: 1 Electronic Signature(s) Signed: 06/28/2023 2:01:51 PM By: Robert Derry PA-C Previous Signature: 06/28/2023 1:54:13 PM Version By: Robert Aver MSN RN CNS WTA Entered By: Robert [name] on 06/28/2023 14:01:50

## 2023-07-05 ENCOUNTER — Encounter: Payer: Managed Care, Other (non HMO) | Admitting: Physician Assistant

## 2023-07-05 DIAGNOSIS — L89610 Pressure ulcer of right heel, unstageable: Secondary | ICD-10-CM | POA: Diagnosis not present

## 2023-07-05 NOTE — Progress Notes (Signed)
Robert, STAFFORD [name] ([id number]) [id number] Page 1 of 9 Visit Report for 07/05/2023 Arrival Information Details Patient Name: Date of Service: Robert Lav [place] [name]. 07/05/2023 1:15 PM Medical Record Number: [id number] Patient Account Number: [id number] Date of Birth/Sex: Treating RN: 07/01/1966 (57 y.o. [name] Primary Care Julianna Vanwagner: Eleanora Neighbor Other Clinician: Referring Pauline Pegues: Treating Niko Jakel/Extender: Brandt Loosen in Treatment: 5 Visit Information History Since Last Visit Added or deleted any medications: No Patient Arrived: Ambulatory Any new allergies or adverse reactions: No Arrival Time: 12:50 Has Dressing in Place as Prescribed: Yes Accompanied By: wife Pain Present Now: No Transfer Assistance: None Patient Identification Verified: Yes Secondary Verification Process Completed: Yes Patient Requires Transmission-Based Precautions: No Patient Has Alerts: Yes Patient Alerts: Not diabetic Electronic Signature(s) Signed: 07/05/2023 4:53:08 PM By: Midge Aver MSN RN CNS WTA Entered By: Midge Aver on 07/05/2023 09:51:32 -------------------------------------------------------------------------------- Clinic Level of Care Assessment Details Patient Name: Date of Service: Robert Lav [place] [name]. 07/05/2023 1:15 PM Medical Record Number: [id number] Patient Account Number: [id number] Date of Birth/Sex: Treating RN: 08-18-1966 (57 y.o. [name] Primary Care Tehani Mersman: Eleanora Neighbor Other Clinician: Referring Ayari Liwanag: Treating Donielle Kaigler/Extender: Brandt Loosen in Treatment: 5 Clinic Level of Care Assessment Items TOOL 4 Quantity Score X- 1 0 Use when only an EandM is performed on FOLLOW-UP visit ASSESSMENTS - Nursing Assessment / Reassessment X- 1 10 Reassessment of Co-morbidities (includes updates in patient status) X- 1 5 Reassessment of Adherence to Treatment  Plan ASSESSMENTS - Wound and Skin A ssessment / Reassessment []  - 0 Simple Wound Assessment / Reassessment - one wound [name] ([id number]) [id number] Page 2 of 9 []  - 0 Complex Wound Assessment / Reassessment - multiple wounds []  - 0 Dermatologic / Skin Assessment (not related to wound area) ASSESSMENTS - Focused Assessment []  - 0 Circumferential Edema Measurements - multi extremities []  - 0 Nutritional Assessment / Counseling / Intervention []  - 0 Lower Extremity Assessment (monofilament, tuning fork, pulses) []  - 0 Peripheral Arterial Disease Assessment (using hand held doppler) ASSESSMENTS - Ostomy and/or Continence Assessment and Care []  - 0 Incontinence Assessment and Management []  - 0 Ostomy Care Assessment and Management (repouching, etc.) PROCESS - Coordination of Care X - Simple Patient / Family Education for ongoing care 1 15 []  - 0 Complex (extensive) Patient / Family Education for ongoing care X- 1 10 Staff obtains Chiropractor, Records, [name] Results / Process Orders est []  - 0 Staff telephones HHA, Nursing Homes / Clarify orders / etc []  - 0 Routine Transfer to another Facility (non-emergent condition) []  - 0 Routine Hospital Admission (non-emergent condition) []  - 0 New Admissions / Manufacturing engineer / Ordering NPWT Apligraf, etc. , []  - 0 Emergency Hospital Admission (emergent condition) X- 1 10 Simple Discharge Coordination []  - 0 Complex (extensive) Discharge Coordination PROCESS - Special Needs []  - 0 Pediatric / Minor Patient Management []  - 0 Isolation Patient Management []  - 0 Hearing / Language / Visual special needs []  - 0 Assessment of Community assistance (transportation, D/C planning, etc.) []  - 0 Additional assistance / Altered mentation []  - 0 Support Surface(s) Assessment (bed, cushion, seat, etc.) INTERVENTIONS - Wound Cleansing / Measurement X - Simple Wound Cleansing - one wound 1 5 []  -  0 Complex Wound Cleansing - multiple wounds X- 1 5 Wound Imaging (photographs - any number of wounds) []  - 0 Wound Tracing (instead of photographs) X- 1 5 Simple Wound Measurement - one wound []  -  0 Complex Wound Measurement - multiple wounds INTERVENTIONS - Wound Dressings []  - 0 Small Wound Dressing one or multiple wounds X- 1 15 Medium Wound Dressing one or multiple wounds []  - 0 Large Wound Dressing one or multiple wounds []  - 0 Application of Medications - topical []  - 0 Application of Medications - injection INTERVENTIONS - Miscellaneous []  - 0 External ear exam []  - 0 Specimen Collection (cultures, biopsies, blood, body fluids, etc.) []  - 0 Specimen(s) / Culture(s) sent or taken to Lab for analysis [name], [name] ([id number]) [id number] Page 3 of 9 []  - 0 Patient Transfer (multiple staff / Nurse, adult / Similar devices) []  - 0 Simple Staple / Suture removal (25 or less) []  - 0 Complex Staple / Suture removal (26 or more) []  - 0 Hypo / Hyperglycemic Management (close monitor of Blood Glucose) []  - 0 Ankle / Brachial Index (ABI) - do not check if billed separately X- 1 5 Vital Signs Has the patient been seen at the hospital within the last three years: Yes Total Score: 85 Level Of Care: New/Established - Level 3 Electronic Signature(s) Signed: 07/05/2023 4:53:08 PM By: Midge Aver MSN RN CNS WTA Entered By: Midge Aver on 07/05/2023 10:10:52 -------------------------------------------------------------------------------- Encounter Discharge Information Details Patient Name: Date of Service: [name], Robert [name]. 07/05/2023 1:15 PM Medical Record Number: [id number] Patient Account Number: [id number] Date of Birth/Sex: Treating RN: [birth year] (57 y.o. [name] Primary Care Jamus Loving: Eleanora Neighbor Other Clinician: Referring [name]: Treating Shayne Diguglielmo/Extender: Brandt Loosen in Treatment:  5 Encounter Discharge Information Items Discharge Condition: Stable Ambulatory Status: Ambulatory Discharge Destination: Home Transportation: Private Auto Accompanied By: wife Schedule Follow-up Appointment: Yes Clinical Summary of Care: Electronic Signature(s) Signed: 07/05/2023 4:53:08 PM By: Midge Aver MSN RN CNS WTA Entered By: Midge Aver on 07/05/2023 10:31:08 -------------------------------------------------------------------------------- Lower Extremity Assessment Details Patient Name: Date of Service: [name], Robert [name]. 07/05/2023 1:15 PM Medical Record Number: [id number] Patient Account Number: [id number] Date of Birth/Sex: Treating RN: 10-23-1965 (57 y.o. [name] Primary Care Terina Mcelhinny: Eleanora Neighbor Other Clinician: [name] ([id number]) [id number] Page 4 of 9 Referring Antigone Crowell: Treating Cleota Pellerito/Extender: Brandt Loosen in Treatment: 5 Electronic Signature(s) Signed: 07/05/2023 4:53:08 PM By: Midge Aver MSN RN CNS WTA Entered By: Midge Aver on 07/05/2023 10:01:34 -------------------------------------------------------------------------------- Multi Wound Chart Details Patient Name: Date of Service: [name], Robert [name]. 07/05/2023 1:15 PM Medical Record Number: [id number] Patient Account Number: [id number] Date of Birth/Sex: Treating RN: 1966-04-03 (57 y.o. [name] Primary Care Nyiesha Beever: Eleanora Neighbor Other Clinician: Referring Niel Peretti: Treating Aoki Wedemeyer/Extender: Brandt Loosen in Treatment: 5 Vital Signs Height(in): 73 Pulse(bpm): 79 Weight(lbs): 205 Blood Pressure(mmHg): 147/86 Body Mass Index(BMI): 27 Temperature(F): 98.4 Respiratory Rate(breaths/min): 18 [1:Photos:] [N/A:N/A] Right Calcaneus N/A N/A Wound Location: Gradually Appeared N/A N/A Wounding Event: Pressure Ulcer N/A N/A Primary Etiology: Hypertension N/A N/A Comorbid  History: 05/20/2023 N/A N/A Date Acquired: 5 N/A N/A Weeks of Treatment: Open N/A N/A Wound Status: No N/A N/A Wound Recurrence: Yes N/A N/A Clustered Wound: 7.5x4.2x0.2 N/A N/A Measurements L x W x D (cm) 24.74 N/A N/A A (cm) : rea 4.948 N/A N/A Volume (cm) : 30.20% N/A N/A % Reduction in A rea: 30.20% N/A N/A % Reduction in Volume: Category/Stage IV N/A N/A Classification: Medium N/A N/A Exudate A mount: Serosanguineous N/A N/A Exudate Type: red, brown N/A N/A Exudate Color: None Present (0%) N/A N/A Granulation A mount: Artz, [place]  [name] ([id number]) [id number] Page 5 of 9 Large (67-100%) N/A N/A Necrotic Amount: Eschar N/A N/A Necrotic Tissue: Fat Layer (Subcutaneous Tissue): Yes N/A N/A Exposed Structures: Fascia: No Tendon: No Muscle: No Joint: No Bone: No None N/A N/A Epithelialization: Treatment Notes Electronic Signature(s) Signed: 07/05/2023 4:53:08 PM By: Midge Aver MSN RN CNS WTA Entered By: Midge Aver on 07/05/2023 10:07:48 -------------------------------------------------------------------------------- Multi-Disciplinary Care Plan Details Patient Name: Date of Service: [name], Robert [name]. 07/05/2023 1:15 PM Medical Record Number: [id number] Patient Account Number: [id number] Date of Birth/Sex: Treating RN: Jun 27, 1966 (57 y.o. [name] Primary Care Kandis Henry: Eleanora Neighbor Other Clinician: Referring Jeaneane Adamec: Treating Lithzy Bernard/Extender: Brandt Loosen in Treatment: 5 Active Inactive Necrotic Tissue Nursing Diagnoses: Impaired tissue integrity related to necrotic/devitalized tissue Knowledge deficit related to management of necrotic/devitalized tissue Goals: Necrotic/devitalized tissue will be minimized in the wound bed Date Initiated: 05/31/2023 Date Inactivated: 06/28/2023 Target Resolution Date: 06/30/2023 Goal Status: Met Patient/caregiver will verbalize understanding of  reason and process for debridement of necrotic tissue Date Initiated: 05/31/2023 Target Resolution Date: 07/22/2023 Goal Status: Active Interventions: Assess patient pain level pre-, during and post procedure and prior to discharge Provide education on necrotic tissue and debridement process Treatment Activities: Apply topical anesthetic as ordered : 05/31/2023 Enzymatic debridement : 05/31/2023 Notes: Wound/Skin Impairment Nursing Diagnoses: Impaired tissue integrity Knowledge deficit related to ulceration/compromised skin integrity Goals: Patient/caregiver will verbalize understanding of skin care regimen Date Initiated: 05/31/2023 Date Inactivated: 06/28/2023 Target Resolution Date: 06/30/2023 [name], [name] ([id number]) [phone].pdf Page 6 of 9 Goal Status: Met Ulcer/skin breakdown will have a volume reduction of 30% by week 4 Date Initiated: 05/31/2023 Date Inactivated: 06/28/2023 Target Resolution Date: 06/30/2023 Goal Status: Met Ulcer/skin breakdown will have a volume reduction of 50% by week 8 Date Initiated: 05/31/2023 Target Resolution Date: 07/31/2023 Goal Status: Active Ulcer/skin breakdown will have a volume reduction of 80% by week 12 Date Initiated: 05/31/2023 Target Resolution Date: 08/30/2023 Goal Status: Active Ulcer/skin breakdown will heal within 14 weeks Date Initiated: 05/31/2023 Target Resolution Date: 09/06/2023 Goal Status: Active Interventions: Assess patient/caregiver ability to obtain necessary supplies Assess patient/caregiver ability to perform ulcer/skin care regimen upon admission and as needed Assess ulceration(s) every visit Provide education on ulcer and skin care Notes: Electronic Signature(s) Signed: 07/05/2023 4:53:08 PM By: Midge Aver MSN RN CNS WTA Entered By: Midge Aver on 07/05/2023 10:11:36 -------------------------------------------------------------------------------- Pain Assessment Details Patient Name: Date of  Service: [name], Robert [name]. 07/05/2023 1:15 PM Medical Record Number: [id number] Patient Account Number: [id number] Date of Birth/Sex: Treating RN: Oct 15, 1965 (57 y.o. [name] Primary Care Immaculate Crutcher: Eleanora Neighbor Other Clinician: Referring Cheyanna Strick: Treating Sania Noy/Extender: Brandt Loosen in Treatment: 5 Active Problems Location of Pain Severity and Description of Pain Patient Has Paino No Site Locations Pain Management and Medication Current Pain Management: [name], [name] ([id number]) [phone].pdf Page 7 of 9 Electronic Signature(s) Signed: 07/05/2023 4:53:08 PM By: Midge Aver MSN RN CNS WTA Entered By: Midge Aver on 07/05/2023 09:54:59 -------------------------------------------------------------------------------- Patient/Caregiver Education Details Patient Name: Date of Service: [name] 10/14/2024andnbsp1:15 PM Medical Record Number: [id number] Patient Account Number: [id number] Date of Birth/Gender: Treating RN: 1966-09-10 (57 y.o. [name] Primary Care Physician: Eleanora Neighbor Other Clinician: Referring Physician: Treating Physician/Extender: Brandt Loosen in Treatment: 5 Education Assessment Education Provided To: Patient Education Topics Provided Wound/Skin Impairment: Handouts: Caring for Your Ulcer Methods: Explain/Verbal Responses: State content correctly Electronic Signature(s) Signed: 07/05/2023 4:53:08 PM  By: Midge Aver MSN RN CNS WTA Entered By: Midge Aver on 07/05/2023 10:11:48 -------------------------------------------------------------------------------- Wound Assessment Details Patient Name: Date of Service: [name], Robert [place] [name]. 07/05/2023 1:15 PM Medical Record Number: [id number] Patient Account Number: [id number] Date of Birth/Sex: Treating RN: 1965-12-17 (57 y.o. [name] Primary Care Jaymi Tinner: Eleanora Neighbor Other  Clinician: Referring Emslee Lopezmartinez: Treating Sanaai Doane/Extender: [name] Weeks in Treatment: 5 Wound Status Wound Number: 1 Primary Etiology: Pressure Ulcer Wound Location: Right Calcaneus Wound Status: Open Wounding Event: Gradually Appeared Comorbid History: Hypertension Date Acquired: 05/20/2023 [facility] Of Treatment: [address] [name] ([id number]) [id number] Page 8 of 9 Clustered Wound: Yes Photos Wound Measurements Length: (cm) 7.5 Width: (cm) 4.2 Depth: (cm) 0.2 Area: (cm) 24.74 Volume: (cm) 4.948 % Reduction in Area: 30.2% % Reduction in Volume: 30.2% Epithelialization: None Wound Description Classification: Category/Stage IV Exudate Amount: Medium Exudate Type: Serosanguineous Exudate Color: red, brown Foul Odor After Cleansing: No Slough/Fibrino No Wound Bed Granulation Amount: None Present (0%) Exposed Structure Necrotic Amount: Large (67-100%) Fascia Exposed: No Necrotic Quality: Eschar Fat Layer (Subcutaneous Tissue) Exposed: Yes Tendon Exposed: No Muscle Exposed: No Joint Exposed: No Bone Exposed: No Treatment Notes Wound #1 (Calcaneus) Wound Laterality: Right Cleanser Soap and Water Discharge Instruction: Use as directed. Peri-Wound Care Topical Primary Dressing Gauze Discharge Instruction: As directed: dry, moistened with saline or moistened with Dakins Solution IODOFLEX 0.9% Cadexomer Iodine Pad Discharge Instruction: Apply Iodoflex to wound bed only as directed. Secondary Dressing ABD Pad 5x9 (in/in) Discharge Instruction: Cover with ABD pad Secured With Kerlix Roll Sterile or Non-Sterile 6-ply 4.5x4 (yd/yd) Discharge Instruction: Apply Kerlix as directed Compression Wrap Compression Stockings Add-Ons Electronic Signature(s) Signed: 07/05/2023 4:53:08 PM By: Midge Aver MSN RN CNS WTA Entered By: Midge Aver on 07/05/2023 10:01:19 [name] ([id number])  [id number] Page 9 of 9 -------------------------------------------------------------------------------- Vitals Details Patient Name: Date of Service: Robert [place] [name]. 07/05/2023 1:15 PM Medical Record Number: [id number] Patient Account Number: [id number] Date of Birth/Sex: Treating RN: 07/16/1966 (57 y.o. [name] Primary Care Mckensey Berghuis: Eleanora Neighbor Other Clinician: Referring Consuelo Thayne: Treating Shon Indelicato/Extender: Brandt Loosen in Treatment: 5 Vital Signs Time Taken: 12:52 Temperature (F): 98.4 Height (in): 73 Pulse (bpm): 79 Weight (lbs): 205 Respiratory Rate (breaths/min): 18 Body Mass Index (BMI): 27 Blood Pressure (mmHg): 147/86 Reference Range: 80 - 120 mg / dl Electronic Signature(s) Signed: 07/05/2023 4:53:08 PM By: Midge Aver MSN RN CNS WTA Entered By: Midge Aver on 07/05/2023 09:54:17

## 2023-07-05 NOTE — Progress Notes (Addendum)
RAIFE, LIZER ([id number]) [id number] Page 1 of 6 Visit Report for 07/05/2023 Chief Complaint Document Details Patient Name: Date of Service: [name] T. 07/05/2023 1:15 PM Medical Record Number: [id number] Patient Account Number: [id number] Date of Birth/Sex: Treating RN: 09-18-1966 (57 y.o. [name] Primary Care Provider: Eleanora Neighbor Other Clinician: Referring Provider: Treating Provider/Extender: Brandt Loosen in Treatment: 5 Information Obtained from: Patient Chief Complaint Right heel ulcer Electronic Signature(s) Signed: 07/05/2023 1:06:35 PM By: Allen Derry PA-C Entered By: Allen Derry on 07/05/2023 10:06:35 -------------------------------------------------------------------------------- HPI Details Patient Name: Date of Service: [name], [name] 07/05/2023 1:15 PM Medical Record Number: [id number] Patient Account Number: [id number] Date of Birth/Sex: Treating RN: 1965-12-25 (57 y.o. [name] Primary Care Provider: Eleanora Neighbor Other Clinician: Referring Provider: Treating Provider/Extender: Brandt Loosen in Treatment: 5 History of Present Illness HPI Description: 05-31-2023 patient presents today for initial evaluation here in the clinic concerning issues that he is having with an eschar over the right heel. Unfortunately this came on seemingly fairly suddenly when he was at work. He does work around [employer] but states he was not doing anything directly with them at that point. Again I am unsure exactly what went on here and to what degree the chemicals could have played a role in this but this almost looks to me more like damage secondary to a chemical burn versus a strict pressure ulcer although I am unsure to be certain. The issue here is simply that he has 3 areas that are somewhat separated by some skin in between although to some degree connected as well  which is very necrotic and eschar covered and to be honest getting any dressing to this is not really going to be of benefit at this point. Fortunately I do not see any signs of active infection at this time which is good news. With that being said I do think that this is gena be difficult to get heal until we get the eschar off of the area. The patient voiced understanding and his wife was present during the evaluation today as well. He does have hypertension but no other major medical problems. T my knowledge this is not a workers o comp case although I am unsure whether or not it should be to be honest. 06-08-2023 upon evaluation today patient appears to be doing well currently in regard to his wound. He has been tolerating the dressing changes without complication. Fortunately there does not appear to be any signs of active infection locally or systemically which is great news. With that being said he still has a pretty much eschar covering he did get the Santyl after a bit of a fight although he only got 2 tubes this may not last a full month but hopefully we will need it the whole month and this will work out just fine for [place] will make do with what we can get. 06-15-2023 upon evaluation today patient's wound is actually starting to soften up as far as the eschar is concerned actually think we may be able to get some of that off today and I discussed that with the patient. He is in agreement with given the a shot to do this. JUELZ, WHITTENBERG ([id number]) [id number] Page 2 of 6 06-22-2023 upon evaluation today patient appears to be doing much better in regard to his heel actually feel like most of the wounds are actually making significant improvement here which is  great news and are very pleased in that regard. I do not see any signs of active infection looking or systemically at this time which is great news and in general I do believe that we are making good headway  towards complete closure. 10-7 24 upon evaluation today patient appears to be doing well currently in regard to his wound. Again the heel is showing signs of excellent improvement and actually very pleased with where we stand I do believe that he is doing quite well currently. 07-05-2023 upon evaluation today patient appears to be doing well currently in regard to his wound. He is actually showing signs of improvement which is great news and in general I do believe that we are making headway towards complete closure which is great news. I think that he is doing well currently with the Iodoflex. Electronic Signature(s) Signed: 07/05/2023 1:52:17 PM By: Allen Derry PA-C Entered By: Allen Derry on 07/05/2023 10:52:17 -------------------------------------------------------------------------------- Physical Exam Details Patient Name: Date of Service: Robert [name] T. 07/05/2023 1:15 PM Medical Record Number: [id number] Patient Account Number: [id number] Date of Birth/Sex: Treating RN: 1965/11/22 (57 y.o. [name] Primary Care Provider: Eleanora Neighbor Other Clinician: Referring Provider: Treating Provider/Extender: [name] Weeks in Treatment: 5 Constitutional Well-nourished and well-hydrated in no acute distress. Respiratory normal breathing without difficulty. Psychiatric this patient is able to make decisions and demonstrates good insight into disease process. Alert and Oriented x 3. pleasant and cooperative. Notes Upon inspection patient's wound bed actually showed signs of good granulation and epithelization at this point I do believe that the patient is making really good headway towards complete closure which is excellent news and in general I think that we are doing quite well here. Electronic Signature(s) Signed: 07/05/2023 1:52:36 PM By: Allen Derry PA-C Entered By: Allen Derry on 07/05/2023  10:52:36 -------------------------------------------------------------------------------- Physician Orders Details Patient Name: Date of Service: [name], [name] 07/05/2023 1:15 PM Medical Record Number: [id number] Patient Account Number: [id number] Date of Birth/Sex: Treating RN: Jan 30, 1966 (57 y.o. [name] Primary Care Provider: Eleanora Neighbor Other Clinician: KODEN, HUNZEKER ([id number]) [id number] Page 3 of 6 Referring Provider: Treating Provider/Extender: Brandt Loosen in Treatment: 5 Verbal / Phone Orders: No Diagnosis Coding ICD-10 Coding Code Description L89.610 Pressure ulcer of right heel, unstageable I10 Essential (primary) hypertension Follow-up Appointments Return Appointment in 1 week. Bathing/ Shower/ Hygiene May shower; gently cleanse wound with antibacterial soap, rinse and pat dry prior to dressing wounds Anesthetic (Use 'Patient Medications' Section for Anesthetic Order Entry) Lidocaine applied to wound bed Wound Treatment Wound #1 - Calcaneus Wound Laterality: Right Cleanser: Soap and Water 3 x Per Week/30 Days Discharge Instructions: Use as directed. Prim Dressing: Gauze 3 x Per Week/30 Days ary Discharge Instructions: As directed: dry, moistened with saline or moistened with Dakins Solution Prim Dressing: IODOFLEX 0.9% Cadexomer Iodine Pad 3 x Per Week/30 Days ary Discharge Instructions: Apply Iodoflex to wound bed only as directed. Secondary Dressing: ABD Pad 5x9 (in/in) 3 x Per Week/30 Days Discharge Instructions: Cover with ABD pad Secured With: Kerlix Roll Sterile or Non-Sterile 6-ply 4.5x4 (yd/yd) (Generic) 3 x Per Week/30 Days Discharge Instructions: Apply Kerlix as directed Electronic Signature(s) Signed: 07/05/2023 4:48:00 PM By: Allen Derry PA-C Signed: 07/05/2023 4:53:08 PM By: Midge Aver MSN RN CNS WTA Entered By: Midge Aver on 07/05/2023  10:09:45 -------------------------------------------------------------------------------- Problem List Details Patient Name: Date of Service: [name], [name] 07/05/2023 1:15 PM Medical  Record Number: [id number] Patient Account Number: [id number] Date of Birth/Sex: Treating RN: [birth year] (57 y.o. [name] Primary Care Provider: Eleanora Neighbor Other Clinician: Referring Provider: Treating Provider/Extender: Brandt Loosen in Treatment: 5 Active Problems ICD-10 Encounter Code Description Active Date MDM Diagnosis L89.610 Pressure ulcer of right heel, unstageable 05/31/2023 No Yes AVIEN, TAHA T ([id number]) [id number] Page 4 of 6 I10 Essential (primary) hypertension 05/31/2023 No Yes Inactive Problems Resolved Problems Electronic Signature(s) Signed: 07/05/2023 4:48:00 PM By: Allen Derry PA-C Signed: 07/05/2023 4:53:08 PM By: Midge Aver MSN RN CNS WTA Previous Signature: 07/05/2023 1:06:33 PM Version By: Allen Derry PA-C Entered By: Midge Aver on 07/05/2023 10:16:12 -------------------------------------------------------------------------------- Progress Note Details Patient Name: Date of Service: [name], [name] 07/05/2023 1:15 PM Medical Record Number: [id number] Patient Account Number: [id number] Date of Birth/Sex: Treating RN: 1966/06/28 (57 y.o. [name] Primary Care Provider: Eleanora Neighbor Other Clinician: Referring Provider: Treating Provider/Extender: Brandt Loosen in Treatment: 5 Subjective Chief Complaint Information obtained from Patient Right heel ulcer History of Present Illness (HPI) 05-31-2023 patient presents today for initial evaluation here in the clinic concerning issues that he is having with an eschar over the right heel. Unfortunately this came on seemingly fairly suddenly when he was at work. He does work around [employer] but states he was not doing  anything directly with them at that point. Again I am unsure exactly what went on here and to what degree the chemicals could have played a role in this but this almost looks to me more like damage secondary to a chemical burn versus a strict pressure ulcer although I am unsure to be certain. The issue here is simply that he has 3 areas that are somewhat separated by some skin in between although to some degree connected as well which is very necrotic and eschar covered and to be honest getting any dressing to this is not really going to be of benefit at this point. Fortunately I do not see any signs of active infection at this time which is good news. With that being said I do think that this is gena be difficult to get heal until we get the eschar off of the area. The patient voiced understanding and his wife was present during the evaluation today as well. He does have hypertension but no other major medical problems. T my knowledge this is not a workers comp o case although I am unsure whether or not it should be to be honest. 06-08-2023 upon evaluation today patient appears to be doing well currently in regard to his wound. He has been tolerating the dressing changes without complication. Fortunately there does not appear to be any signs of active infection locally or systemically which is great news. With that being said he still has a pretty much eschar covering he did get the Santyl after a bit of a fight although he only got 2 tubes this may not last a full month but hopefully we will need it the whole month and this will work out just fine for [place] will make do with what we can get. 06-15-2023 upon evaluation today patient's wound is actually starting to soften up as far as the eschar is concerned actually think we may be able to get some of that off today and I discussed that with the patient. He is in agreement with given the a shot to do this. 06-22-2023 upon evaluation today patient appears  to be doing  much better in regard to his heel actually feel like most of the wounds are actually making significant improvement here which is great news and are very pleased in that regard. I do not see any signs of active infection looking or systemically at this time which is great news and in general I do believe that we are making good headway towards complete closure. 10-7 24 upon evaluation today patient appears to be doing well currently in regard to his wound. Again the heel is showing signs of excellent improvement and actually very pleased with where we stand I do believe that he is doing quite well currently. 07-05-2023 upon evaluation today patient appears to be doing well currently in regard to his wound. He is actually showing signs of improvement which is great news and in general I do believe that we are making headway towards complete closure which is great news. I think that he is doing well currently with the Iodoflex. STANDLEY, BARGO ([id number]) [id number] Page 5 of 6 Objective Constitutional Well-nourished and well-hydrated in no acute distress. Vitals Time Taken: 12:52 PM, Height: 73 in, Weight: 205 lbs, BMI: 27, Temperature: 98.4 F, Pulse: 79 bpm, Respiratory Rate: 18 breaths/min, Blood Pressure: 147/86 mmHg. Respiratory normal breathing without difficulty. Psychiatric this patient is able to make decisions and demonstrates good insight into disease process. Alert and Oriented x 3. pleasant and cooperative. General Notes: Upon inspection patient's wound bed actually showed signs of good granulation and epithelization at this point I do believe that the patient is making really good headway towards complete closure which is excellent news and in general I think that we are doing quite well here. Integumentary (Hair, Skin) Wound #1 status is Open. Original cause of wound was Gradually Appeared. The date acquired was: 05/20/2023. The wound has been  in treatment 5 weeks. The wound is located on the Right Calcaneus. The wound measures 7.5cm length x 4.2cm width x 0.2cm depth; 24.74cm^2 area and 4.948cm^3 volume. There is Fat Layer (Subcutaneous Tissue) exposed. There is a medium amount of serosanguineous drainage noted. There is no granulation within the wound bed. There is a large (67-100%) amount of necrotic tissue within the wound bed including Eschar. Assessment Active Problems ICD-10 Pressure ulcer of right heel, unstageable Essential (primary) hypertension Plan Follow-up Appointments: Return Appointment in 1 week. Bathing/ Shower/ Hygiene: May shower; gently cleanse wound with antibacterial soap, rinse and pat dry prior to dressing wounds Anesthetic (Use 'Patient Medications' Section for Anesthetic Order Entry): Lidocaine applied to wound bed WOUND #1: - Calcaneus Wound Laterality: Right Cleanser: Soap and Water 3 x Per Week/30 Days Discharge Instructions: Use as directed. Prim Dressing: Gauze 3 x Per Week/30 Days ary Discharge Instructions: As directed: dry, moistened with saline or moistened with Dakins Solution Prim Dressing: IODOFLEX 0.9% Cadexomer Iodine Pad 3 x Per Week/30 Days ary Discharge Instructions: Apply Iodoflex to wound bed only as directed. Secondary Dressing: ABD Pad 5x9 (in/in) 3 x Per Week/30 Days Discharge Instructions: Cover with ABD pad Secured With: Kerlix Roll Sterile or Non-Sterile 6-ply 4.5x4 (yd/yd) (Generic) 3 x Per Week/30 Days Discharge Instructions: Apply Kerlix as directed 1. I would recommend that we have the patient continue to monitor for any signs of infection or worsening. Based on what I am seeing I do believe that we are doing excellent with regard to the dressings currently were using Iodoflex which I think has been beneficial. 2. I would recommend as well that the patient should continue with the ABD pads  to cover followed by roll gauze to secure in place. We will see patient back for  reevaluation in 1 week here in the clinic. If anything worsens or changes patient will contact our office for additional recommendations. Electronic Signature(s) Signed: 07/05/2023 1:53:01 PM By: Allen Derry PA-C Entered By: Allen Derry on 07/05/2023 10:53:01 [name] ([id number]) [id number] Page 6 of 6 -------------------------------------------------------------------------------- SuperBill Details Patient Name: Date of Service: [name] T. 07/05/2023 Medical Record Number: [id number] Patient Account Number: [id number] Date of Birth/Sex: Treating RN: [birth year] (57 y.o. [name] Primary Care Provider: Eleanora Neighbor Other Clinician: Referring Provider: Treating Provider/Extender: Brandt Loosen in Treatment: 5 Diagnosis Coding ICD-10 Codes Code Description L89.610 Pressure ulcer of right heel, unstageable I10 Essential (primary) hypertension Facility Procedures : CPT4 Code: [id number] Description: 99213 - WOUND CARE VISIT-LEV 3 EST PT Modifier: Quantity: 1 Physician Procedures : CPT4 Code Description Modifier [id number] 99213 - WC PHYS LEVEL 3 - EST PT ICD-10 Diagnosis Description L89.610 Pressure ulcer of right heel, unstageable I10 Essential (primary) hypertension Quantity: 1 Electronic Signature(s) Signed: 07/05/2023 1:54:45 PM By: Allen Derry PA-C Entered By: Allen Derry on 07/05/2023 10:54:44

## 2023-07-12 ENCOUNTER — Encounter: Payer: Managed Care, Other (non HMO) | Admitting: Physician Assistant

## 2023-07-12 DIAGNOSIS — L89610 Pressure ulcer of right heel, unstageable: Secondary | ICD-10-CM | POA: Diagnosis not present

## 2023-07-12 NOTE — Progress Notes (Signed)
DEVERNE, BAZER Robert ([id number]) [id number] Page 1 of 7 Visit Report for 07/12/2023 Arrival Information Details Patient Name: Date of Service: [name] Robert. 07/12/2023 8:30 A M Medical Record Number: [id number] Patient Account Number: [id number] Date of Birth/Sex: Treating RN: 04-08-1966 (57 y.o. [name] Primary Care [name]: [name] Other Clinician: Referring [name]: Treating [name]/Extender: [name]: 6 Visit Information History Since Last Visit Added or deleted any medications: No Patient Arrived: Ambulatory Any new allergies or adverse reactions: No Arrival Time: 08:40 Has Dressing in Place as Prescribed: Yes Accompanied By: self Pain Present Now: No Transfer Assistance: None Patient Identification Verified: Yes Secondary Verification Process Completed: Yes Patient Requires Transmission-Based Precautions: No Patient Has Alerts: Yes Patient Alerts: Not diabetic Electronic Signature(s) Signed: 07/12/2023 5:06:23 PM By: Robert Aver MSN RN CNS WTA Entered By: [name] on 07/12/2023 05:43:57 -------------------------------------------------------------------------------- Encounter Discharge Information Details Patient Name: Date of Service: [name], Robert YD Robert. 07/12/2023 8:30 A M Medical Record Number: [id number] Patient Account Number: [id number] Date of Birth/Sex: Treating RN: [birth year] (57 y.o. [name] Primary Care Zyen Triggs: [name] Other Clinician: Referring Jadyn Brasher: Treating Roxan Yamamoto/Extender: [name]: 6 Encounter Discharge Information Items Post Procedure Vitals Discharge Condition: Stable Temperature (F): 97.5 Ambulatory Status: Ambulatory Pulse (bpm): 71 Discharge Destination: Home Respiratory Rate (breaths/min): 18 Transportation: Private Auto Blood Pressure (mmHg): 150/81 Accompanied By:  self Schedule Follow-up Appointment: Yes Clinical Summary of Care: Electronic Signature(s) Signed: 07/12/2023 5:06:23 PM By: Robert Aver MSN RN CNS [name], [name] 07/12/2023 5:06:23 PM By: Robert Aver MSN RN CNS WTA Signed: T ([id number]) [id number] Page 2 of 7 Entered By: [name] on 07/12/2023 06:05:13 -------------------------------------------------------------------------------- Lower Extremity Assessment Details Patient Name: Date of Service: [name] Robert. 07/12/2023 8:30 A M Medical Record Number: [id number] Patient Account Number: [id number] Date of Birth/Sex: Treating RN: [birth year] (57 y.o. [name] Primary Care [name]: [name] Other Clinician: Referring [name]: Treating [name]/Extender: [name]: 6 Electronic Signature(s) Signed: 07/12/2023 5:06:23 PM By: Robert Aver MSN RN CNS WTA Entered By: [name] on 07/12/2023 05:53:27 -------------------------------------------------------------------------------- Multi Wound Chart Details Patient Name: Date of Service: [name], Robert YD Robert. 07/12/2023 8:30 A M Medical Record Number: [id number] Patient Account Number: [id number] Date of Birth/Sex: Treating RN: July 01, 1966 (57 y.o. [name] Primary Care Isaiyah Feldhaus: [name] Other Clinician: Referring [name]: Treating [name]/Extender: [name]: 6 Vital Signs Height(in): 73 Pulse(bpm): 71 Weight(lbs): 205 Blood Pressure(mmHg): 150/81 Body Mass Index(BMI): 27 Temperature(F): 97.5 Respiratory Rate(breaths/min): 18 [1:Photos:] [N/A:N/A] Right Calcaneus N/A N/A Wound Location: Gradually Appeared N/A N/A Wounding Event: Pressure Ulcer N/A N/A Primary Etiology: Hypertension N/A N/A Comorbid History: 05/20/2023 N/A N/A Date Acquired: 6 N/A N/A Weeks of Treatment: Open N/A N/A Wound Status: No N/A  N/A Wound Recurrence: Yes N/A N/A Clustered Wound: 8x3x0.2 N/A N/A Measurements L x W x D (cm) 18.85 N/A N/A A (cm) : rea 3.77 N/A N/A Volume (cm) : 46.80% N/A N/A % Reduction in A rea: 46.80% N/A N/A % Reduction in Volume: Category/Stage IV N/A N/A Classification: Medium N/A N/A Exudate A mount: Serosanguineous N/A N/A Exudate Type: red, brown N/A N/A Exudate Color: None Present (0%) N/A N/A Granulation A mount: Large (67-100%) N/A N/A Necrotic A mount: Eschar N/A N/A Necrotic Tissue: Fat Layer (Subcutaneous Tissue): Yes N/A N/A Exposed Structures: Fascia: No Tendon: No Muscle: No Joint:  No Bone: No None N/A N/A Epithelialization: Treatment Notes Electronic Signature(s) Signed: 07/12/2023 5:06:23 PM By: Robert Aver MSN RN CNS WTA Entered By: [name] on 07/12/2023 05:57:50 -------------------------------------------------------------------------------- Multi-Disciplinary Care Plan Details Patient Name: Date of Service: [name], Robert YD Robert. 07/12/2023 8:30 A M Medical Record Number: [id number] Patient Account Number: [id number] Date of Birth/Sex: Treating RN: 08/06/1966 (57 y.o. [name] Primary Care [name]: [name] Other Clinician: Referring [name]: Treating [name]/Extender: [name]: 6 Active Inactive Necrotic Tissue Nursing Diagnoses: Impaired tissue integrity related to necrotic/devitalized tissue Knowledge deficit related to management of necrotic/devitalized tissue Goals: Necrotic/devitalized tissue will be minimized in the wound bed Date Initiated: 05/31/2023 Date Inactivated: 06/28/2023 Target Resolution Date: 06/30/2023 Goal Status: Met Patient/caregiver will verbalize understanding of reason and process for debridement of necrotic tissue [name] ([id number]) [phone].pdf Page 4 of 7 Date Initiated: 05/31/2023 Target Resolution Date:  07/22/2023 Goal Status: Active Interventions: Assess patient pain level pre-, during and post procedure and prior to discharge Provide education on necrotic tissue and debridement process Treatment Activities: Apply topical anesthetic as ordered : 05/31/2023 Enzymatic debridement : 05/31/2023 Notes: Wound/Skin Impairment Nursing Diagnoses: Impaired tissue integrity Knowledge deficit related to ulceration/compromised skin integrity Goals: Patient/caregiver will verbalize understanding of skin care regimen Date Initiated: 05/31/2023 Date Inactivated: 06/28/2023 Target Resolution Date: 06/30/2023 Goal Status: Met Ulcer/skin breakdown will have a volume reduction of 30% by week 4 Date Initiated: 05/31/2023 Date Inactivated: 06/28/2023 Target Resolution Date: 06/30/2023 Goal Status: Met Ulcer/skin breakdown will have a volume reduction of 50% by week 8 Date Initiated: 05/31/2023 Target Resolution Date: 07/31/2023 Goal Status: Active Ulcer/skin breakdown will have a volume reduction of 80% by week 12 Date Initiated: 05/31/2023 Target Resolution Date: 08/30/2023 Goal Status: Active Ulcer/skin breakdown will heal within 14 weeks Date Initiated: 05/31/2023 Target Resolution Date: 09/06/2023 Goal Status: Active Interventions: Assess patient/caregiver ability to obtain necessary supplies Assess patient/caregiver ability to perform ulcer/skin care regimen upon admission and as needed Assess ulceration(s) every visit Provide education on ulcer and skin care Notes: Electronic Signature(s) Signed: 07/12/2023 5:06:23 PM By: Robert Aver MSN RN CNS WTA Entered By: [name] on 07/12/2023 06:02:56 -------------------------------------------------------------------------------- Pain Assessment Details Patient Name: Date of Service: [name], Robert YD Robert. 07/12/2023 8:30 A M Medical Record Number: [id number] Patient Account Number: [id number] Date of Birth/Sex: Treating RN: 09-10-1966 (57 y.o. [name] Primary Care Dyanara Cozza: [name] Other Clinician: Referring Abdallah Hern: Treating Yasenia Reedy/Extender: [name]: 6 Active Problems Location of Pain Severity and Description of Pain Patient Has Paino Yes Site Locations [name], [name] ([id number]) [id number] Page 5 of 7 Site Locations Rate the pain. Current Pain Level: 4 Pain Management and Medication Current Pain Management: Electronic Signature(s) Signed: 07/12/2023 5:06:23 PM By: Robert Aver MSN RN CNS WTA Entered By: [name] on 07/12/2023 05:44:35 -------------------------------------------------------------------------------- Patient/Caregiver Education Details Patient Name: Date of Service: [name], Robert YD Robert. 10/21/2024andnbsp8:30 A M Medical Record Number: [id number] Patient Account Number: [id number] Date of Birth/Gender: Treating RN: 08/16/1966 (57 y.o. [name] Primary Care Physician: [name] Other Clinician: Referring Physician: Treating Physician/Extender: [name]: 6 Education Assessment Education Provided To: Patient Education Topics Provided Wound Debridement: Methods: Explain/Verbal Responses: State content correctly Wound/Skin Impairment: Handouts: Caring for Your Ulcer Methods: Explain/Verbal Responses: State content correctly Electronic Signature(s) Signed: 07/12/2023 5:06:23 PM By: Robert Aver MSN RN CNS WTA Entered By: [name] on 07/12/2023 06:03:26  [name], [name] ([id number]) [id number] Page 6 of 7 -------------------------------------------------------------------------------- Wound Assessment Details Patient Name: Date of Service: [name] Robert. 07/12/2023 8:30 A M Medical Record Number: [id number] Patient Account Number: [id number] Date of Birth/Sex: Treating RN: [birth year] (57 y.o. [name] Primary Care  Ellarae Nevitt: [name] Other Clinician: Referring Lyriq Jarchow: Treating Chayla Shands/Extender: [name]: 6 Wound Status Wound Number: 1 Primary Etiology: Pressure Ulcer Wound Location: Right Calcaneus Wound Status: Open Wounding Event: Gradually Appeared Comorbid History: Hypertension Date Acquired: 05/20/2023 Weeks Of Treatment: 6 Clustered Wound: Yes Photos Wound Measurements Length: (cm) 8 Width: (cm) 3 Depth: (cm) 0.2 Area: (cm) 18.8 Volume: (cm) 3.77 % Reduction in Area: 46.8% % Reduction in Volume: 46.8% Epithelialization: None 5 Wound Description Classification: Category/Stage IV Exudate Amount: Medium Exudate Type: Serosanguineous Exudate Color: red, brown Foul Odor After Cleansing: No Slough/Fibrino No Wound Bed Granulation Amount: None Present (0%) Exposed Structure Necrotic Amount: Large (67-100%) Fascia Exposed: No Necrotic Quality: Eschar Fat Layer (Subcutaneous Tissue) Exposed: Yes Tendon Exposed: No Muscle Exposed: No Joint Exposed: No Bone Exposed: No Treatment Notes Wound #1 (Calcaneus) Wound Laterality: Right Cleanser Soap and Water Discharge Instruction: Use as directed. Peri-Wound Care [name], [name] ([id number]) [id number] Page 7 of 7 Topical Primary Dressing Gauze Discharge Instruction: As directed: dry, moistened with saline or moistened with Dakins Solution IODOFLEX 0.9% Cadexomer Iodine Pad Discharge Instruction: Apply Iodoflex to wound bed only as directed. Secondary Dressing ABD Pad 5x9 (in/in) Discharge Instruction: Cover with ABD pad Secured With Kerlix Roll Sterile or Non-Sterile 6-ply 4.5x4 (yd/yd) Discharge Instruction: Apply Kerlix as directed Compression Wrap Compression Stockings Add-Ons Electronic Signature(s) Signed: 07/12/2023 5:06:23 PM By: Robert Aver MSN RN CNS WTA Entered By: [name] on 07/12/2023  05:52:47 -------------------------------------------------------------------------------- Vitals Details Patient Name: Date of Service: [name], Robert YD Robert. 07/12/2023 8:30 A M Medical Record Number: [id number] Patient Account Number: [id number] Date of Birth/Sex: Treating RN: Jan 03, 1966 (57 y.o. [name] Primary Care Cem Kosman: [name] Other Clinician: Referring Treylan Mcclintock: Treating Persephanie Laatsch/Extender: [name]: 6 Vital Signs Time Taken: 08:44 Temperature (F): 97.5 Height (in): 73 Pulse (bpm): 71 Weight (lbs): 205 Respiratory Rate (breaths/min): 18 Body Mass Index (BMI): 27 Blood Pressure (mmHg): 150/81 Reference Range: 80 - 120 mg / dl Electronic Signature(s) Signed: 07/12/2023 5:06:23 PM By: Robert Aver MSN RN CNS WTA Entered By: [name] on 07/12/2023 05:44:17

## 2023-07-12 NOTE — Progress Notes (Signed)
[name] ([id number]) [id number] Page 1 of 7 Visit Report for 07/12/2023 Chief Complaint Document Details Patient Name: Date of Service: [name]. 07/12/2023 8:30 A M Medical Record Number: [id number] Patient Account Number: [id number] Date of Birth/Sex: Treating RN: 04/04/1966 (57 y.o. [name]: [name] Other Clinician: Referring [name]: Treating [name]/Extender: [name]: 6 Information Obtained from: Patient Chief Complaint Right heel ulcer Electronic Signature(s) Signed: 07/12/2023 4:50:43 PM By: Robert Derry PA-C Entered By: [name] on 07/12/2023 05:26:51 -------------------------------------------------------------------------------- Debridement Details Patient Name: Date of Service: [name], [name]. 07/12/2023 8:30 A M Medical Record Number: [id number] Patient Account Number: [id number] Date of Birth/Sex: Treating RN: [birth year] (57 y.o. [name]: [name] Other Clinician: Referring [name]: Treating [name]/Extender: [name]: 6 Debridement Performed for Assessment: Wound #1 Right Calcaneus Performed By: Physician Robert Derry, PA-C The following information was scribed by: [name] The information was scribed for: [name] Debridement Type: Debridement Level of Consciousness (Pre-procedure): Awake and Alert Pre-procedure Verification/Time Out Yes - 08:58 Taken: Start Time: 08:58 Pain Control: Lidocaine 4% [name] opical Solution Percent of Wound Bed Debrided: 50% [name] Area Debrided (cm): otal 9.42 Tissue and other material debrided: Viable, Non-Viable, Callus, Slough, Subcutaneous, Slough Level: Skin/Subcutaneous Tissue Debridement Description: Excisional Instrument: Curette Bleeding: Minimum Hemostasis Achieved: Silver Nitrate [name], [name]  ([id number]) [id number] Page 2 of 7 Procedural Pain: 0 Post Procedural Pain: 0 Response to Treatment: Procedure was tolerated well Level of Consciousness (Post- Awake and Alert procedure): Post Debridement Measurements of Total Wound Length: (cm) 8 Stage: Category/Stage IV Width: (cm) 3 Depth: (cm) 0.2 Volume: (cm) 3.77 Character of Wound/Ulcer Post Debridement: Stable Post Procedure Diagnosis Same as Pre-procedure Electronic Signature(s) Signed: 07/12/2023 4:50:43 PM By: Robert Derry PA-C Signed: 07/12/2023 5:06:23 PM By: Robert Aver MSN RN CNS WTA Entered By: [name] on 07/12/2023 06:04:19 -------------------------------------------------------------------------------- HPI Details Patient Name: Date of Service: [name], [name]. 07/12/2023 8:30 A M Medical Record Number: [id number] Patient Account Number: [id number] Date of Birth/Sex: Treating RN: 02-28-1966 (57 y.o. [name]: [name] Other Clinician: Referring [name]: Treating [name]/Extender: [name]: 6 History of Present Illness HPI Description: 05-31-2023 patient presents today for initial evaluation here in the clinic concerning issues that he is having with an eschar over the right heel. Unfortunately this came on seemingly fairly suddenly when he was at work. He does work around [employer] but states he was not doing anything directly with them at that point. Again I am unsure exactly what went on here and to what degree the chemicals could have played a role in this but this almost looks to me more like damage secondary to a chemical burn versus a strict pressure ulcer although I am unsure to be certain. The issue here is simply that he has 3 areas that are somewhat separated by some skin in between although to some degree connected as well which is very necrotic and eschar covered and to be  honest getting any dressing to this is not really going to be of benefit at this point. Fortunately I do not see any signs of active infection at this time which is good news. With that being said I do think that this is gena be difficult to get heal until we get the eschar off of the area. The patient  voiced understanding and his wife was present during the evaluation today as well. He does have hypertension but no other major medical problems. [name] my knowledge this is not a workers o comp case although I am unsure whether or not it should be to be honest. 06-08-2023 upon evaluation today patient appears to be doing well currently in regard to his wound. He has been tolerating the dressing changes without complication. Fortunately there does not appear to be any signs of active infection locally or systemically which is great news. With that being said he still has a pretty much eschar covering he did get the Santyl after a bit of a fight although he only got 2 tubes this may not last a full month but hopefully we will need it the whole month and this will work out just fine for [place] will make do with what we can get. 06-15-2023 upon evaluation today patient's wound is actually starting to soften up as far as the eschar is concerned actually think we may be able to get some of that off today and I discussed that with the patient. He is in agreement with given the a shot to do this. 06-22-2023 upon evaluation today patient appears to be doing much better in regard to his heel actually feel like most of the wounds are actually making significant improvement here which is great news and are very pleased in that regard. I do not see any signs of active infection looking or systemically at this time which is great news and in general I do believe that we are making good headway towards complete closure. 10-7 24 upon evaluation today patient appears to be doing well currently in regard to his wound. Again the  heel is showing signs of excellent improvement and actually very pleased with where we stand I do believe that he is doing quite well currently. 07-05-2023 upon evaluation today patient appears to be doing well currently in regard to his wound. He is actually showing signs of improvement which is great news and in general I do believe that we are making headway towards complete closure which is great news. I think that he is doing well currently with the Iodoflex. 07-12-2023 upon evaluation patient's wound bed actually showed signs of good granulation epithelization at this point. He is tolerating dressing changes without complication and good news is I feel like they were moving in the right direction here. I do not see any signs of active infection at this time. [name], [name] ([id number]) [id number] Page 3 of 7 Electronic Signature(s) Signed: 07/12/2023 9:10:53 AM By: Robert Derry PA-C Entered By: [name] on 07/12/2023 06:10:53 -------------------------------------------------------------------------------- Physical Exam Details Patient Name: Date of Service: [name], Robert [place] [name]. 07/12/2023 8:30 A M Medical Record Number: [id number] Patient Account Number: [id number] Date of Birth/Sex: Treating RN: [birth year] (57 y.o. [name]: [name] Other Clinician: Referring [name]: Treating [name]/Extender: [name] Weeks in Treatment: 6 Constitutional Well-nourished and well-hydrated in no acute distress. Respiratory normal breathing without difficulty. Psychiatric this patient is able to make decisions and demonstrates good insight into disease process. Alert and Oriented x 3. pleasant and cooperative. Notes Upon inspection patient's wound bed actually showed signs of good granulation and epithelization at this point. I do think that he is making headway towards closure which is good news and in  general I think the Iodosorb is doing a really good job here for him working to continue as  such. Electronic Signature(s) Signed: 07/12/2023 9:11:11 AM By: Robert Derry PA-C Entered By: [name] on 07/12/2023 06:11:11 -------------------------------------------------------------------------------- Physician Orders Details Patient Name: Date of Service: [name], [name]. 07/12/2023 8:30 A M Medical Record Number: [id number] Patient Account Number: [id number] Date of Birth/Sex: Treating RN: 1965-12-31 (57 y.o. [name]: [name] Other Clinician: Referring [name]: Treating [name]/Extender: [name]: 6 The following information was scribed by: [name] The information was scribed for: [name] Verbal / Phone Orders: No Diagnosis Coding ICD-10 Coding Code Description ESGARDO, CHATWIN ([id number]) [id number] Page 4 of 7 L89.610 Pressure ulcer of right heel, unstageable I10 Essential (primary) hypertension Follow-up Appointments Return Appointment in 1 week. Bathing/ Shower/ Hygiene May shower; gently cleanse wound with antibacterial soap, rinse and pat dry prior to dressing wounds Anesthetic (Use 'Patient Medications' Section for Anesthetic Order Entry) Lidocaine applied to wound bed Wound Treatment Wound #1 - Calcaneus Wound Laterality: Right Cleanser: Soap and Water 3 x Per Week/30 Days Discharge Instructions: Use as directed. Prim Dressing: Gauze 3 x Per Week/30 Days ary Discharge Instructions: As directed: dry, moistened with saline or moistened with Dakins Solution Prim Dressing: IODOFLEX 0.9% Cadexomer Iodine Pad 3 x Per Week/30 Days ary Discharge Instructions: Apply Iodoflex to wound bed only as directed. Secondary Dressing: ABD Pad 5x9 (in/in) 3 x Per Week/30 Days Discharge Instructions: Cover with ABD pad Secured With: Kerlix Roll Sterile or  Non-Sterile 6-ply 4.5x4 (yd/yd) (Generic) 3 x Per Week/30 Days Discharge Instructions: Apply Kerlix as directed Electronic Signature(s) Signed: 07/12/2023 4:50:43 PM By: Robert Derry PA-C Signed: 07/12/2023 5:06:23 PM By: Robert Aver MSN RN CNS WTA Entered By: [name] on 07/12/2023 06:02:00 -------------------------------------------------------------------------------- Problem List Details Patient Name: Date of Service: [name], [name]. 07/12/2023 8:30 A M Medical Record Number: [id number] Patient Account Number: [id number] Date of Birth/Sex: Treating RN: Apr 17, 1966 (57 y.o. [name]: [name] Other Clinician: Referring [name]: Treating [name]/Extender: [name]: 6 Active Problems ICD-10 Encounter Code Description Active Date MDM Diagnosis L89.610 Pressure ulcer of right heel, unstageable 05/31/2023 No Yes I10 Essential (primary) hypertension 05/31/2023 No Yes Inactive Problems [name], [name] ([id number]) [id number] Page 5 of 7 Resolved Problems Electronic Signature(s) Signed: 07/12/2023 4:50:43 PM By: Robert Derry PA-C Entered By: [name] on 07/12/2023 05:26:46 -------------------------------------------------------------------------------- Progress Note Details Patient Name: Date of Service: [name], [name]. 07/12/2023 8:30 A M Medical Record Number: [id number] Patient Account Number: [id number] Date of Birth/Sex: Treating RN: [birth year] (57 y.o. [name]: [name] Other Clinician: Referring [name]: Treating [name]/Extender: [name]: 6 Subjective Chief Complaint Information obtained from Patient Right heel ulcer History of Present Illness (HPI) 05-31-2023 patient presents today for initial evaluation here in the clinic concerning issues that he is having with an  eschar over the right heel. Unfortunately this came on seemingly fairly suddenly when he was at work. He does work around [employer] but states he was not doing anything directly with them at that point. Again I am unsure exactly what went on here and to what degree the chemicals could have played a role in this but this almost looks to me more like damage secondary to a chemical burn versus a strict pressure ulcer although I am unsure to be certain. The issue here is simply that he has 3 areas that are somewhat separated  by some skin in between although to some degree connected as well which is very necrotic and eschar covered and to be honest getting any dressing to this is not really going to be of benefit at this point. Fortunately I do not see any signs of active infection at this time which is good news. With that being said I do think that this is gena be difficult to get heal until we get the eschar off of the area. The patient voiced understanding and his wife was present during the evaluation today as well. He does have hypertension but no other major medical problems. [name] my knowledge this is not a workers comp o case although I am unsure whether or not it should be to be honest. 06-08-2023 upon evaluation today patient appears to be doing well currently in regard to his wound. He has been tolerating the dressing changes without complication. Fortunately there does not appear to be any signs of active infection locally or systemically which is great news. With that being said he still has a pretty much eschar covering he did get the Santyl after a bit of a fight although he only got 2 tubes this may not last a full month but hopefully we will need it the whole month and this will work out just fine for [place] will make do with what we can get. 06-15-2023 upon evaluation today patient's wound is actually starting to soften up as far as the eschar is concerned actually think we may be able to get  some of that off today and I discussed that with the patient. He is in agreement with given the a shot to do this. 06-22-2023 upon evaluation today patient appears to be doing much better in regard to his heel actually feel like most of the wounds are actually making significant improvement here which is great news and are very pleased in that regard. I do not see any signs of active infection looking or systemically at this time which is great news and in general I do believe that we are making good headway towards complete closure. 10-7 24 upon evaluation today patient appears to be doing well currently in regard to his wound. Again the heel is showing signs of excellent improvement and actually very pleased with where we stand I do believe that he is doing quite well currently. 07-05-2023 upon evaluation today patient appears to be doing well currently in regard to his wound. He is actually showing signs of improvement which is great news and in general I do believe that we are making headway towards complete closure which is great news. I think that he is doing well currently with the Iodoflex. 07-12-2023 upon evaluation patient's wound bed actually showed signs of good granulation epithelization at this point. He is tolerating dressing changes without complication and good news is I feel like they were moving in the right direction here. I do not see any signs of active infection at this time. Objective Constitutional Well-nourished and well-hydrated in no acute distress. [name], [name] ([id number]) [id number] Page 6 of 7 Vitals Time Taken: 8:44 AM, Height: 73 in, Weight: 205 lbs, BMI: 27, Temperature: 97.5 F, Pulse: 71 bpm, Respiratory Rate: 18 breaths/min, Blood Pressure: 150/81 mmHg. Respiratory normal breathing without difficulty. Psychiatric this patient is able to make decisions and demonstrates good insight into disease process. Alert and Oriented x 3.  pleasant and cooperative. General Notes: Upon inspection patient's wound bed actually showed signs of good granulation and  epithelization at this point. I do think that he is making headway towards closure which is good news and in general I think the Iodosorb is doing a really good job here for him working to continue as such. Integumentary (Hair, Skin) Wound #1 status is Open. Original cause of wound was Gradually Appeared. The date acquired was: 05/20/2023. The wound has been in treatment 6 weeks. The wound is located on the Right Calcaneus. The wound measures 8cm length x 3cm width x 0.2cm depth; 18.85cm^2 area and 3.77cm^3 volume. There is Fat Layer (Subcutaneous Tissue) exposed. There is a medium amount of serosanguineous drainage noted. There is no granulation within the wound bed. There is a large (67-100%) amount of necrotic tissue within the wound bed including Eschar. Assessment Active Problems ICD-10 Pressure ulcer of right heel, unstageable Essential (primary) hypertension Procedures Wound #1 Pre-procedure diagnosis of Wound #1 is a Pressure Ulcer located on the Right Calcaneus . There was a Excisional Skin/Subcutaneous Tissue Debridement with a total area of 9.42 sq cm performed by Robert Derry, PA-C. With the following instrument(s): Curette to remove Viable and Non-Viable tissue/material. Material removed includes Callus, Subcutaneous Tissue, and Slough after achieving pain control using Lidocaine 4% [name] opical Solution. No specimens were taken. A time out was conducted at 08:58, prior to the start of the procedure. A Minimum amount of bleeding was controlled with Silver Nitrate. The procedure was tolerated well with a pain level of 0 throughout and a pain level of 0 following the procedure. Post Debridement Measurements: 8cm length x 3cm width x 0.2cm depth; 3.77cm^3 volume. Post debridement Stage noted as Category/Stage IV. Character of Wound/Ulcer Post Debridement is stable. Post  procedure Diagnosis Wound #1: Same as Pre-Procedure Plan Follow-up Appointments: Return Appointment in 1 week. Bathing/ Shower/ Hygiene: May shower; gently cleanse wound with antibacterial soap, rinse and pat dry prior to dressing wounds Anesthetic (Use 'Patient Medications' Section for Anesthetic Order Entry): Lidocaine applied to wound bed WOUND #1: - Calcaneus Wound Laterality: Right Cleanser: Soap and Water 3 x Per Week/30 Days Discharge Instructions: Use as directed. Prim Dressing: Gauze 3 x Per Week/30 Days ary Discharge Instructions: As directed: dry, moistened with saline or moistened with Dakins Solution Prim Dressing: IODOFLEX 0.9% Cadexomer Iodine Pad 3 x Per Week/30 Days ary Discharge Instructions: Apply Iodoflex to wound bed only as directed. Secondary Dressing: ABD Pad 5x9 (in/in) 3 x Per Week/30 Days Discharge Instructions: Cover with ABD pad Secured With: Kerlix Roll Sterile or Non-Sterile 6-ply 4.5x4 (yd/yd) (Generic) 3 x Per Week/30 Days Discharge Instructions: Apply Kerlix as directed 1. Based on what I am seeing I am going to recommend that the patient should continue with the Iodosorb which I think is doing a good job of using Iodoflex here in the clinic either way it works the same. 2. I am going to recommend the patient should continue with ABD pad to cover followed by the roll gauze to secure in place. This is utilize along with ABD pad. 3. I am also can recommend the patient should continue to monitor for any signs of infection or worsening overall if anything changes he knows contact the office and let me know. We will see patient back for reevaluation in 1 week here in the clinic. If anything worsens or changes patient will contact our office for additional recommendations. Electronic Signature(s) [name], [name] ([id number]) [id number] Page 7 of 7 Signed: 07/12/2023 2:11:01 PM By: Robert Derry PA-C Entered By: [name] on  07/12/2023 11:11:01 --------------------------------------------------------------------------------  SuperBill Details Patient Name: Date of Service: [name] 07/12/2023 Medical Record Number: [id number] Patient Account Number: [id number] Date of Birth/Sex: Treating RN: July 30, 1966 (57 y.o. [name]: [name] Other Clinician: Referring [name]: Treating [name]/Extender: [name]: 6 Diagnosis Coding ICD-10 Codes Code Description L89.610 Pressure ulcer of right heel, unstageable I10 Essential (primary) hypertension Facility Procedures : CPT4 Code: [id number] Description: 11042 - DEB SUBQ TISSUE 20 SQ CM/< ICD-10 Diagnosis Description L89.610 Pressure ulcer of right heel, unstageable Modifier: Quantity: 1 Physician Procedures : CPT4 Code Description Modifier [id number] 11042 - WC PHYS SUBQ TISS 20 SQ CM ICD-10 Diagnosis Description L89.610 Pressure ulcer of right heel, unstageable Quantity: 1 Electronic Signature(s) Signed: 07/12/2023 2:11:17 PM By: Robert Derry PA-C Entered By: [name] on 07/12/2023 11:11:17

## 2023-07-19 ENCOUNTER — Encounter: Payer: Managed Care, Other (non HMO) | Admitting: Physician Assistant

## 2023-07-19 DIAGNOSIS — L89610 Pressure ulcer of right heel, unstageable: Secondary | ICD-10-CM | POA: Diagnosis not present

## 2023-07-19 NOTE — Progress Notes (Signed)
KAYMAN, STOLZEN[place]G ([id number]) [id number] Page 1 of 7 Visit Report for 07/19/2023 Chief Complaint Document Details Patient Name: Date of Service: [name] T. 07/19/2023 3:30 PM Medical Record Number: [id number] Patient Account Number: [id number] Date of Birth/Sex: Treating RN: September 27, 1965 (57 y.o. [name] Primary Care Provider: Eleanora Neighbor Other Clinician: Referring Provider: Treating Provider/Extender: Brandt Loosen in Treatment: 7 Information Obtained from: Patient Chief Complaint Right heel ulcer Electronic Signature(s) Signed: 07/19/2023 3:53:41 PM By: Allen Derry PA-C Entered By: Allen Derry on 07/19/2023 15:53:40 -------------------------------------------------------------------------------- Debridement Details Patient Name: Date of Service: [name], [name] 07/19/2023 3:30 PM Medical Record Number: [id number] Patient Account Number: [id number] Date of Birth/Sex: Treating RN: 10-Sep-1966 (57 y.o. [name] Primary Care Provider: Eleanora Neighbor Other Clinician: Referring Provider: Treating Provider/Extender: Brandt Loosen in Treatment: 7 Debridement Performed for Assessment: Wound #1 Right Calcaneus Performed By: Physician Allen Derry, PA-C The following information was scribed by: Midge Aver The information was scribed for: Allen Derry Debridement Type: Debridement Level of Consciousness (Pre-procedure): Awake and Alert Pre-procedure Verification/Time Out Yes - 16:03 Taken: Start Time: 16:03 Pain Control: Lidocaine 4% T opical Solution Percent of Wound Bed Debrided: 100% T Area Debrided (cm): otal 31.4 Tissue and other material debrided: Viable, Non-Viable, Slough, Subcutaneous, Slough Level: Skin/Subcutaneous Tissue Debridement Description: Excisional Instrument: Curette Bleeding: Minimum Hemostasis Achieved: Pressure VOYD, MEHNERT T ([id number])  [id number] Page 2 of 7 Procedural Pain: 0 Post Procedural Pain: 0 Response to Treatment: Procedure was tolerated well Level of Consciousness (Post- Awake and Alert procedure): Post Debridement Measurements of Total Wound Length: (cm) 8 Stage: Category/Stage IV Width: (cm) 5 Depth: (cm) 0.2 Volume: (cm) 6.283 Character of Wound/Ulcer Post Debridement: Stable Post Procedure Diagnosis Same as Pre-procedure Electronic Signature(s) Signed: 07/19/2023 4:36:05 PM By: Midge Aver MSN RN CNS WTA Signed: 07/19/2023 4:54:21 PM By: Allen Derry PA-C Entered By: Midge Aver on 07/19/2023 16:04:47 -------------------------------------------------------------------------------- HPI Details Patient Name: Date of Service: [name], [name] 07/19/2023 3:30 PM Medical Record Number: [id number] Patient Account Number: [id number] Date of Birth/Sex: Treating RN: 20-Mar-1966 (57 y.o. [name] Primary Care Provider: Eleanora Neighbor Other Clinician: Referring Provider: Treating Provider/Extender: Brandt Loosen in Treatment: 7 History of Present Illness HPI Description: 05-31-2023 patient presents today for initial evaluation here in the clinic concerning issues that he is having with an eschar over the right heel. Unfortunately this came on seemingly fairly suddenly when he was at work. He does work around [employer] but states he was not doing anything directly with them at that point. Again I am unsure exactly what went on here and to what degree the chemicals could have played a role in this but this almost looks to me more like damage secondary to a chemical burn versus a strict pressure ulcer although I am unsure to be certain. The issue here is simply that he has 3 areas that are somewhat separated by some skin in between although to some degree connected as well which is very necrotic and eschar covered and to be honest getting any  dressing to this is not really going to be of benefit at this point. Fortunately I do not see any signs of active infection at this time which is good news. With that being said I do think that this is gena be difficult to get heal until we get the eschar off of the area. The patient voiced understanding and his wife  was present during the evaluation today as well. He does have hypertension but no other major medical problems. T my knowledge this is not a workers o comp case although I am unsure whether or not it should be to be honest. 06-08-2023 upon evaluation today patient appears to be doing well currently in regard to his wound. He has been tolerating the dressing changes without complication. Fortunately there does not appear to be any signs of active infection locally or systemically which is great news. With that being said he still has a pretty much eschar covering he did get the Santyl after a bit of a fight although he only got 2 tubes this may not last a full month but hopefully we will need it the whole month and this will work out just fine for [place] will make do with what we can get. 06-15-2023 upon evaluation today patient's wound is actually starting to soften up as far as the eschar is concerned actually think we may be able to get some of that off today and I discussed that with the patient. He is in agreement with given the a shot to do this. 06-22-2023 upon evaluation today patient appears to be doing much better in regard to his heel actually feel like most of the wounds are actually making significant improvement here which is great news and are very pleased in that regard. I do not see any signs of active infection looking or systemically at this time which is great news and in general I do believe that we are making good headway towards complete closure. 10-7 24 upon evaluation today patient appears to be doing well currently in regard to his wound. Again the heel is showing signs  of excellent improvement and actually very pleased with where we stand I do believe that he is doing quite well currently. 07-05-2023 upon evaluation today patient appears to be doing well currently in regard to his wound. He is actually showing signs of improvement which is great news and in general I do believe that we are making headway towards complete closure which is great news. I think that he is doing well currently with the Iodoflex. 07-12-2023 upon evaluation patient's wound bed actually showed signs of good granulation epithelization at this point. He is tolerating dressing changes without complication and good news is I feel like they were moving in the right direction here. I do not see any signs of active infection at this time. 07-19-2023 upon evaluation today patient appears to be doing very well in regard to his wound the heel actually showing signs of dramatic improvement at this JACOBI, RATLIFF T ([id number]) [id number] Page 3 of 7 time. There does not appear to be any signs of active infection which is good news. No fevers, chills, nausea, vomiting, or diarrhea. Electronic Signature(s) Signed: 07/19/2023 4:31:21 PM By: Allen Derry PA-C Entered By: Allen Derry on 07/19/2023 16:31:21 -------------------------------------------------------------------------------- Physical Exam Details Patient Name: Date of Service: [name] T. 07/19/2023 3:30 PM Medical Record Number: [id number] Patient Account Number: [id number] Date of Birth/Sex: Treating RN: 04/30/1966 (57 y.o. [name] Primary Care Provider: Eleanora Neighbor Other Clinician: Referring Provider: Treating Provider/Extender: [name] Weeks in Treatment: 7 Constitutional Well-nourished and well-hydrated in no acute distress. Respiratory normal breathing without difficulty. Psychiatric this patient is able to make decisions and demonstrates good insight  into disease process. Alert and Oriented x 3. pleasant and cooperative. Notes Upon inspection patient's wound bed did require  sharp debridement postdebridement the wound actually appears to be doing so much better with more granulation tissue than we have seen thus far and this is extremely well as far as I am concerned. I am actually very pleased with where things stand. Electronic Signature(s) Signed: 07/19/2023 4:31:50 PM By: Allen Derry PA-C Entered By: Allen Derry on 07/19/2023 16:31:50 -------------------------------------------------------------------------------- Physician Orders Details Patient Name: Date of Service: [name], [name] 07/19/2023 3:30 PM Medical Record Number: [id number] Patient Account Number: [id number] Date of Birth/Sex: Treating RN: 1966-04-02 (57 y.o. [name] Primary Care Provider: Eleanora Neighbor Other Clinician: Referring Provider: Treating Provider/Extender: Brandt Loosen in Treatment: 7 The following information was scribed by: Midge Aver The information was scribed for: Allen Derry Verbal / Phone Orders: No Diagnosis Coding HELTON, BONSER T ([id number]) [id number] Page 4 of 7 ICD-10 Coding Code Description L89.610 Pressure ulcer of right heel, unstageable I10 Essential (primary) hypertension Follow-up Appointments Return Appointment in 1 week. Bathing/ Shower/ Hygiene May shower; gently cleanse wound with antibacterial soap, rinse and pat dry prior to dressing wounds Anesthetic (Use 'Patient Medications' Section for Anesthetic Order Entry) Lidocaine applied to wound bed Wound Treatment Wound #1 - Calcaneus Wound Laterality: Right Cleanser: Soap and Water 3 x Per Week/30 Days Discharge Instructions: Use as directed. Prim Dressing: Hydrofera Blue Ready Transfer Foam, 2.5x2.5 (in/in) (DME) (Generic) 3 x Per Week/30 Days ary Discharge Instructions: Apply Hydrofera Blue Ready to  wound bed as directed Secondary Dressing: ABD Pad 5x9 (in/in) (DME) (Generic) 3 x Per Week/30 Days Discharge Instructions: Cover with ABD pad Secured With: Kerlix Roll Sterile or Non-Sterile 6-ply 4.5x4 (yd/yd) (DME) (Generic) 3 x Per Week/30 Days Discharge Instructions: Apply Kerlix as directed Electronic Signature(s) Signed: 07/19/2023 4:36:05 PM By: Midge Aver MSN RN CNS WTA Signed: 07/19/2023 4:54:21 PM By: Allen Derry PA-C Entered By: Midge Aver on 07/19/2023 16:05:48 -------------------------------------------------------------------------------- Problem List Details Patient Name: Date of Service: [name], [name] 07/19/2023 3:30 PM Medical Record Number: [id number] Patient Account Number: [id number] Date of Birth/Sex: Treating RN: [birth year] (57 y.o. [name] Primary Care Provider: Eleanora Neighbor Other Clinician: Referring Provider: Treating Provider/Extender: Brandt Loosen in Treatment: 7 Active Problems ICD-10 Encounter Code Description Active Date MDM Diagnosis L89.610 Pressure ulcer of right heel, unstageable 05/31/2023 No Yes I10 Essential (primary) hypertension 05/31/2023 No Yes Inactive Problems KINCAID, PEBLEY ([id number]) [phone].pdf Page 5 of 7 Resolved Problems Electronic Signature(s) Signed: 07/19/2023 3:53:37 PM By: Allen Derry PA-C Entered By: Allen Derry on 07/19/2023 15:53:37 -------------------------------------------------------------------------------- Progress Note Details Patient Name: Date of Service: [name], LLO [place] T. 07/19/2023 3:30 PM Medical Record Number: [id number] Patient Account Number: [id number] Date of Birth/Sex: Treating RN: [birth year] (57 y.o. [name] Primary Care Provider: Eleanora Neighbor Other Clinician: Referring Provider: Treating Provider/Extender: Brandt Loosen in Treatment: 7 Subjective Chief Complaint Information obtained  from Patient Right heel ulcer History of Present Illness (HPI) 05-31-2023 patient presents today for initial evaluation here in the clinic concerning issues that he is having with an eschar over the right heel. Unfortunately this came on seemingly fairly suddenly when he was at work. He does work around [employer] but states he was not doing anything directly with them at that point. Again I am unsure exactly what went on here and to what degree the chemicals could have played a role in this but this almost looks to me more like damage secondary to a chemical burn versus  a strict pressure ulcer although I am unsure to be certain. The issue here is simply that he has 3 areas that are somewhat separated by some skin in between although to some degree connected as well which is very necrotic and eschar covered and to be honest getting any dressing to this is not really going to be of benefit at this point. Fortunately I do not see any signs of active infection at this time which is good news. With that being said I do think that this is gena be difficult to get heal until we get the eschar off of the area. The patient voiced understanding and his wife was present during the evaluation today as well. He does have hypertension but no other major medical problems. T my knowledge this is not a workers comp o case although I am unsure whether or not it should be to be honest. 06-08-2023 upon evaluation today patient appears to be doing well currently in regard to his wound. He has been tolerating the dressing changes without complication. Fortunately there does not appear to be any signs of active infection locally or systemically which is great news. With that being said he still has a pretty much eschar covering he did get the Santyl after a bit of a fight although he only got 2 tubes this may not last a full month but hopefully we will need it the whole month and this will work out just fine for [place] will make  do with what we can get. 06-15-2023 upon evaluation today patient's wound is actually starting to soften up as far as the eschar is concerned actually think we may be able to get some of that off today and I discussed that with the patient. He is in agreement with given the a shot to do this. 06-22-2023 upon evaluation today patient appears to be doing much better in regard to his heel actually feel like most of the wounds are actually making significant improvement here which is great news and are very pleased in that regard. I do not see any signs of active infection looking or systemically at this time which is great news and in general I do believe that we are making good headway towards complete closure. 10-7 24 upon evaluation today patient appears to be doing well currently in regard to his wound. Again the heel is showing signs of excellent improvement and actually very pleased with where we stand I do believe that he is doing quite well currently. 07-05-2023 upon evaluation today patient appears to be doing well currently in regard to his wound. He is actually showing signs of improvement which is great news and in general I do believe that we are making headway towards complete closure which is great news. I think that he is doing well currently with the Iodoflex. 07-12-2023 upon evaluation patient's wound bed actually showed signs of good granulation epithelization at this point. He is tolerating dressing changes without complication and good news is I feel like they were moving in the right direction here. I do not see any signs of active infection at this time. 07-19-2023 upon evaluation today patient appears to be doing very well in regard to his wound the heel actually showing signs of dramatic improvement at this time. There does not appear to be any signs of active infection which is good news. No fevers, chills, nausea, vomiting, or diarrhea. [address] [place], [name] ([id number])  [id number] Page 6 of 7 Constitutional Well-nourished and  well-hydrated in no acute distress. Vitals Time Taken: 3:23 PM, Height: 73 in, Weight: 205 lbs, BMI: 27, Temperature: 98.2 F, Pulse: 89 bpm, Respiratory Rate: 18 breaths/min, Blood Pressure: 175/79 mmHg. Respiratory normal breathing without difficulty. Psychiatric this patient is able to make decisions and demonstrates good insight into disease process. Alert and Oriented x 3. pleasant and cooperative. General Notes: Upon inspection patient's wound bed did require sharp debridement postdebridement the wound actually appears to be doing so much better with more granulation tissue than we have seen thus far and this is extremely well as far as I am concerned. I am actually very pleased with where things stand. Integumentary (Hair, Skin) Wound #1 status is Open. Original cause of wound was Gradually Appeared. The date acquired was: 05/20/2023. The wound has been in treatment 7 weeks. The wound is located on the Right Calcaneus. The wound measures 8cm length x 5cm width x 0.2cm depth; 31.416cm^2 area and 6.283cm^3 volume. There is Fat Layer (Subcutaneous Tissue) exposed. There is a medium amount of serosanguineous drainage noted. There is no granulation within the wound bed. There is a large (67-100%) amount of necrotic tissue within the wound bed including Eschar. Assessment Active Problems ICD-10 Pressure ulcer of right heel, unstageable Essential (primary) hypertension Procedures Wound #1 Pre-procedure diagnosis of Wound #1 is a Pressure Ulcer located on the Right Calcaneus . There was a Excisional Skin/Subcutaneous Tissue Debridement with a total area of 31.4 sq cm performed by Allen Derry, PA-C. With the following instrument(s): Curette to remove Viable and Non-Viable tissue/material. Material removed includes Subcutaneous Tissue and Slough and after achieving pain control using Lidocaine 4% T opical  Solution. No specimens were taken. A time out was conducted at 16:03, prior to the start of the procedure. A Minimum amount of bleeding was controlled with Pressure. The procedure was tolerated well with a pain level of 0 throughout and a pain level of 0 following the procedure. Post Debridement Measurements: 8cm length x 5cm width x 0.2cm depth; 6.283cm^3 volume. Post debridement Stage noted as Category/Stage IV. Character of Wound/Ulcer Post Debridement is stable. Post procedure Diagnosis Wound #1: Same as Pre-Procedure Plan Follow-up Appointments: Return Appointment in 1 week. Bathing/ Shower/ Hygiene: May shower; gently cleanse wound with antibacterial soap, rinse and pat dry prior to dressing wounds Anesthetic (Use 'Patient Medications' Section for Anesthetic Order Entry): Lidocaine applied to wound bed WOUND #1: - Calcaneus Wound Laterality: Right Cleanser: Soap and Water 3 x Per Week/30 Days Discharge Instructions: Use as directed. Prim Dressing: Hydrofera Blue Ready Transfer Foam, 2.5x2.5 (in/in) (DME) (Generic) 3 x Per Week/30 Days ary Discharge Instructions: Apply Hydrofera Blue Ready to wound bed as directed Secondary Dressing: ABD Pad 5x9 (in/in) (DME) (Generic) 3 x Per Week/30 Days Discharge Instructions: Cover with ABD pad Secured With: Kerlix Roll Sterile or Non-Sterile 6-ply 4.5x4 (yd/yd) (DME) (Generic) 3 x Per Week/30 Days Discharge Instructions: Apply Kerlix as directed 1. I would recommend that we have the patient continue to change this dressing on a regular basis 3 times per week though we will switch to [facility] I think this is going to be better for him. 2. I am going to recommend as well that the patient should have the ABD pad to cover which I think is doing quite well. 3. Will use roll gauze to secure in place. We will see patient back for reevaluation in 1 week here in the clinic. If anything worsens or changes patient will contact our office for  additional recommendations.  Electronic Signature(s) YALE, WILNER T ([id number]) [id number] Page 7 of 7 Signed: 07/19/2023 4:40:00 PM By: Allen Derry PA-C Entered By: Allen Derry on 07/19/2023 16:39:59 -------------------------------------------------------------------------------- SuperBill Details Patient Name: Date of Service: [name], [name] 07/19/2023 Medical Record Number: [id number] Patient Account Number: [id number] Date of Birth/Sex: Treating RN: 1965/12/08 (57 y.o. [name] Primary Care Provider: Eleanora Neighbor Other Clinician: Referring Provider: Treating Provider/Extender: Brandt Loosen in Treatment: 7 Diagnosis Coding ICD-10 Codes Code Description L89.610 Pressure ulcer of right heel, unstageable I10 Essential (primary) hypertension Facility Procedures : CPT4 Code: [id number] 1 Description: 1042 - DEB SUBQ TISSUE 20 SQ CM/< ICD-10 Diagnosis Description L89.610 Pressure ulcer of right heel, unstageable Modifier: Quantity: 1 : CPT4 Code: [id number] 1 Description: 1045 - DEB SUBQ TISS EA ADDL 20CM ICD-10 Diagnosis Description L89.610 Pressure ulcer of right heel, unstageable Modifier: Quantity: 1 Physician Procedures : CPT4 Code Description Modifier [id number] 11042 - WC PHYS SUBQ TISS 20 SQ CM ICD-10 Diagnosis Description L89.610 Pressure ulcer of right heel, unstageable Quantity: 1 : [id number] 11045 - WC PHYS SUBQ TISS EA ADDL 20 CM ICD-10 Diagnosis Description L89.610 Pressure ulcer of right heel, unstageable Quantity: 1 Electronic Signature(s) Signed: 07/19/2023 4:41:24 PM By: Allen Derry PA-C Entered By: Allen Derry on 07/19/2023 16:41:23

## 2023-07-19 NOTE — Progress Notes (Signed)
TABORIS, TEDROW [name] ([id number]) [id number] Page 1 of 8 Visit Report for 07/19/2023 Arrival Information Details Patient Name: Date of Service: Robert [place] [name]. 07/19/2023 3:30 PM Medical Record Number: [id number] Patient Account Number: [id number] Date of Birth/Sex: Treating RN: 1965-10-15 (57 y.o. [name] Primary Care Kerry Odonohue: Eleanora Neighbor Other Clinician: Referring Amrie Gurganus: Treating Charity Tessier/Extender: Brandt Loosen in Treatment: 7 Visit Information History Since Last Visit All ordered tests and consults were completed: No Patient Arrived: Ambulatory Added or deleted any medications: No Arrival Time: 15:19 Any new allergies or adverse reactions: No Accompanied By: self Has Dressing in Place as Prescribed: Yes Transfer Assistance: None Pain Present Now: No Patient Identification Verified: Yes Secondary Verification Process Completed: Yes Patient Requires Transmission-Based Precautions: No Patient Has Alerts: Yes Patient Alerts: Not diabetic Electronic Signature(s) Signed: 07/19/2023 4:36:05 PM By: Midge Aver MSN RN CNS WTA Entered By: Midge Aver on 07/19/2023 12:20:12 -------------------------------------------------------------------------------- Clinic Level of Care Assessment Details Patient Name: Date of Service: [name], Robert [place] [name]. 07/19/2023 3:30 PM Medical Record Number: [id number] Patient Account Number: [id number] Date of Birth/Sex: Treating RN: Jul 23, 1966 (57 y.o. [name] Primary Care Lacretia Tindall: Eleanora Neighbor Other Clinician: Referring Conlin Brahm: Treating Crista Nuon/Extender: Brandt Loosen in Treatment: 7 Clinic Level of Care Assessment Items TOOL 1 Quantity Score []  - 0 Use when EandM and Procedure is performed on INITIAL visit ASSESSMENTS - Nursing Assessment / Reassessment []  - 0 General Physical Exam (combine w/ comprehensive assessment (listed just below)  when performed on new pt. evals) []  - 0 Comprehensive Assessment (HX, ROS, Risk Assessments, Wounds Hx, etc.) ASSESSMENTS - Wound and Skin Assessment / Reassessment []  - 0 Dermatologic / Skin Assessment (not related to wound area) ASSESSMENTS - Ostomy and/or Continence Assessment and Care [name], [name] ([id number]) [id number] Page 2 of 8 []  - 0 Incontinence Assessment and Management []  - 0 Ostomy Care Assessment and Management (repouching, etc.) PROCESS - Coordination of Care []  - 0 Simple Patient / Family Education for ongoing care []  - 0 Complex (extensive) Patient / Family Education for ongoing care []  - 0 Staff obtains Chiropractor, Records, [name] Results / Process Orders est []  - 0 Staff telephones HHA, Nursing Homes / Clarify orders / etc []  - 0 Routine Transfer to another Facility (non-emergent condition) []  - 0 Routine Hospital Admission (non-emergent condition) []  - 0 New Admissions / Manufacturing engineer / Ordering NPWT Apligraf, etc. , []  - 0 Emergency Hospital Admission (emergent condition) PROCESS - Special Needs []  - 0 Pediatric / Minor Patient Management []  - 0 Isolation Patient Management []  - 0 Hearing / Language / Visual special needs []  - 0 Assessment of Community assistance (transportation, D/C planning, etc.) []  - 0 Additional assistance / Altered mentation []  - 0 Support Surface(s) Assessment (bed, cushion, seat, etc.) INTERVENTIONS - Miscellaneous []  - 0 External ear exam []  - 0 Patient Transfer (multiple staff / Nurse, adult / Similar devices) []  - 0 Simple Staple / Suture removal (25 or less) []  - 0 Complex Staple / Suture removal (26 or more) []  - 0 Hypo/Hyperglycemic Management (do not check if billed separately) []  - 0 Ankle / Brachial Index (ABI) - do not check if billed separately Has the patient been seen at the hospital within the last three years: Yes Total Score: 0 Level Of Care: ____ Electronic  Signature(s) Signed: 07/19/2023 4:36:05 PM By: Midge Aver MSN RN CNS WTA Entered By: Midge Aver on 07/19/2023 13:05:57 -------------------------------------------------------------------------------- Encounter Discharge  Information Details Patient Name: Date of Service: Robert Mulligan. 07/19/2023 3:30 PM Medical Record Number: [id number] Patient Account Number: [id number] Date of Birth/Sex: Treating RN: Jan 15, 1966 (57 y.o. [name] Primary Care Eleri Ruben: Eleanora Neighbor Other Clinician: Referring Nguyen Butler: Treating Coley Littles/Extender: Brandt Loosen in Treatment: 7 Encounter Discharge Information Items Post Procedure Vitals Discharge Condition: Stable Temperature (F): 98.2 [name], [name] ([id number]) [id number] Page 3 of 8 Ambulatory Status: Ambulatory Pulse (bpm): 89 Discharge Destination: Home Respiratory Rate (breaths/min): 18 Transportation: Private Auto Blood Pressure (mmHg): 175/79 Accompanied By: wife Schedule Follow-up Appointment: Yes Clinical Summary of Care: Electronic Signature(s) Signed: 07/19/2023 4:36:05 PM By: Midge Aver MSN RN CNS WTA Entered By: Midge Aver on 07/19/2023 13:07:43 -------------------------------------------------------------------------------- Lower Extremity Assessment Details Patient Name: Date of Service: [name], Robert [place] [name]. 07/19/2023 3:30 PM Medical Record Number: [id number] Patient Account Number: [id number] Date of Birth/Sex: Treating RN: 09/15/1966 (57 y.o. [name] Primary Care Aleya Durnell: Eleanora Neighbor Other Clinician: Referring Chelci [place]: Treating Shiquan Mathieu/Extender: Brandt Loosen in Treatment: 7 Electronic Signature(s) Signed: 07/19/2023 4:36:05 PM By: Midge Aver MSN RN CNS WTA Entered By: Midge Aver on 07/19/2023 12:35:22 -------------------------------------------------------------------------------- Multi Wound Chart  Details Patient Name: Date of Service: [name], Robert [name]. 07/19/2023 3:30 PM Medical Record Number: [id number] Patient Account Number: [id number] Date of Birth/Sex: Treating RN: 1966-03-09 (57 y.o. [name] Primary Care Tayley Mudrick: Eleanora Neighbor Other Clinician: Referring Wilhemenia Camba: Treating Fleurette Woolbright/Extender: Brandt Loosen in Treatment: 7 Vital Signs Height(in): 73 Pulse(bpm): 89 Weight(lbs): 205 Blood Pressure(mmHg): 175/79 Body Mass Index(BMI): 27 Temperature(F): 98.2 Respiratory Rate(breaths/min): 18 [1:Photos:] [N/A:N/A] Right Calcaneus N/A N/A Wound Location: Gradually Appeared N/A N/A Wounding Event: Pressure Ulcer N/A N/A Primary Etiology: Hypertension N/A N/A Comorbid History: 05/20/2023 N/A N/A Date Acquired: 7 N/A N/A Weeks of Treatment: Open N/A N/A Wound Status: No N/A N/A Wound Recurrence: Yes N/A N/A Clustered Wound: 8x5x0.2 N/A N/A Measurements L x W x D (cm) 31.416 N/A N/A A (cm) : rea 6.283 N/A N/A Volume (cm) : 11.30% N/A N/A % Reduction in A rea: 11.30% N/A N/A % Reduction in Volume: Category/Stage IV N/A N/A Classification: Medium N/A N/A Exudate A mount: Serosanguineous N/A N/A Exudate Type: red, brown N/A N/A Exudate Color: None Present (0%) N/A N/A Granulation A mount: Large (67-100%) N/A N/A Necrotic A mount: Eschar N/A N/A Necrotic Tissue: Fat Layer (Subcutaneous Tissue): Yes N/A N/A Exposed Structures: Fascia: No Tendon: No Muscle: No Joint: No Bone: No None N/A N/A Epithelialization: Treatment Notes Electronic Signature(s) Signed: 07/19/2023 4:36:05 PM By: Midge Aver MSN RN CNS WTA Entered By: Midge Aver on 07/19/2023 12:37:20 -------------------------------------------------------------------------------- Multi-Disciplinary Care Plan Details Patient Name: Date of Service: [name], Robert [name]. 07/19/2023 3:30 PM Medical Record Number: [id number] Patient Account Number:  [id number] Date of Birth/Sex: Treating RN: 1966/01/22 (58 y.o. [name] Primary Care Alva Broxson: Eleanora Neighbor Other Clinician: Referring Kelleen Stolze: Treating Cassundra Mckeever/Extender: Brandt Loosen in Treatment: 7 Active Inactive Necrotic Tissue [name], [name] ([id number]) [id number] Page 5 of 8 Nursing Diagnoses: Impaired tissue integrity related to necrotic/devitalized tissue Knowledge deficit related to management of necrotic/devitalized tissue Goals: Necrotic/devitalized tissue will be minimized in the wound bed Date Initiated: 05/31/2023 Date Inactivated: 06/28/2023 Target Resolution Date: 06/30/2023 Goal Status: Met Patient/caregiver will verbalize understanding of reason and process for debridement of necrotic tissue Date Initiated: 05/31/2023 Target Resolution Date: 08/21/2023 Goal Status: Active Interventions: Assess patient pain level pre-, during and  post procedure and prior to discharge Provide education on necrotic tissue and debridement process Treatment Activities: Apply topical anesthetic as ordered : 05/31/2023 Enzymatic debridement : 05/31/2023 Notes: Wound/Skin Impairment Nursing Diagnoses: Impaired tissue integrity Knowledge deficit related to ulceration/compromised skin integrity Goals: Patient/caregiver will verbalize understanding of skin care regimen Date Initiated: 05/31/2023 Date Inactivated: 06/28/2023 Target Resolution Date: 06/30/2023 Goal Status: Met Ulcer/skin breakdown will have a volume reduction of 30% by week 4 Date Initiated: 05/31/2023 Date Inactivated: 06/28/2023 Target Resolution Date: 06/30/2023 Goal Status: Met Ulcer/skin breakdown will have a volume reduction of 50% by week 8 Date Initiated: 05/31/2023 Target Resolution Date: 07/31/2023 Goal Status: Active Ulcer/skin breakdown will have a volume reduction of 80% by week 12 Date Initiated: 05/31/2023 Target Resolution Date: 08/30/2023 Goal  Status: Active Ulcer/skin breakdown will heal within 14 weeks Date Initiated: 05/31/2023 Target Resolution Date: 09/06/2023 Goal Status: Active Interventions: Assess patient/caregiver ability to obtain necessary supplies Assess patient/caregiver ability to perform ulcer/skin care regimen upon admission and as needed Assess ulceration(s) every visit Provide education on ulcer and skin care Notes: Electronic Signature(s) Signed: 07/19/2023 4:36:05 PM By: Midge Aver MSN RN CNS WTA Entered By: Midge Aver on 07/19/2023 13:06:31 -------------------------------------------------------------------------------- Pain Assessment Details Patient Name: Date of Service: [name], Robert [name]. 07/19/2023 3:30 PM Medical Record Number: [id number] Patient Account Number: [id number] [name], [name] ([id number]) [phone].pdf Page 6 of 8 Date of Birth/Sex: Treating RN: [birth year] (57 y.o. [name] Primary Care Deny Chevez: Other Clinician: Eleanora Neighbor Referring Samari Bittinger: Treating Arshia Spellman/Extender: Brandt Loosen in Treatment: 7 Active Problems Location of Pain Severity and Description of Pain Patient Has Paino Yes Site Locations Rate the pain. Current Pain Level: 4 Pain Management and Medication Current Pain Management: Electronic Signature(s) Signed: 07/19/2023 4:36:05 PM By: Midge Aver MSN RN CNS WTA Entered By: Midge Aver on 07/19/2023 12:26:56 -------------------------------------------------------------------------------- Patient/Caregiver Education Details Patient Name: Date of Service: [name] 10/28/2024andnbsp3:30 PM Medical Record Number: [id number] Patient Account Number: [id number] Date of Birth/Gender: Treating RN: Dec 11, 1965 (57 y.o. [name] Primary Care Physician: Eleanora Neighbor Other Clinician: Referring Physician: Treating Physician/Extender: Brandt Loosen in  Treatment: 7 Education Assessment Education Provided To: Patient Education Topics Provided Wound Debridement: Handouts: Wound Debridement Methods: Explain/Verbal Responses: State content correctly Wound/Skin Impairment: Handouts: Caring for Your Ulcer Methods: Explain/Verbal [name], [name] ([id number]) [id number] Page 7 of 8 Responses: State content correctly Electronic Signature(s) Signed: 07/19/2023 4:36:05 PM By: Midge Aver MSN RN CNS WTA Entered By: Midge Aver on 07/19/2023 13:06:48 -------------------------------------------------------------------------------- Wound Assessment Details Patient Name: Date of Service: [name], Robert [name]. 07/19/2023 3:30 PM Medical Record Number: [id number] Patient Account Number: [id number] Date of Birth/Sex: Treating RN: [birth year] (57 y.o. [name] Primary Care Kaili Castille: Eleanora Neighbor Other Clinician: Referring Everlynn Sagun: Treating Brad Mcgaughy/Extender: [name] Weeks in Treatment: 7 Wound Status Wound Number: 1 Primary Etiology: Pressure Ulcer Wound Location: Right Calcaneus Wound Status: Open Wounding Event: Gradually Appeared Comorbid History: Hypertension Date Acquired: 05/20/2023 Weeks Of Treatment: 7 Clustered Wound: Yes Photos Wound Measurements Length: (cm) 8 Width: (cm) 5 Depth: (cm) 0.2 Area: (cm) 31.416 Volume: (cm) 6.283 % Reduction in Area: 11.3% % Reduction in Volume: 11.3% Epithelialization: None Wound Description Classification: Category/Stage IV Exudate Amount: Medium Exudate Type: Serosanguineous Exudate Color: red, brown Foul Odor After Cleansing: No Slough/Fibrino No Wound Bed Granulation Amount: None Present (0%) Exposed Structure Necrotic Amount: Large (67-100%) Fascia Exposed: No Necrotic Quality: Eschar Fat Layer (Subcutaneous  Tissue) Exposed: Yes Tendon Exposed: No Muscle Exposed: No Joint Exposed: No Bone Exposed: No [name], HORNECKER [name] ([id number]) [id number].pdf Page 8 of 8 Treatment Notes Wound #1 (Calcaneus) Wound Laterality: Right Cleanser Soap and Water Discharge Instruction: Use as directed. Peri-Wound Care Topical Primary Dressing Hydrofera Blue Ready Transfer Foam, 2.5x2.5 (in/in) Discharge Instruction: Apply Hydrofera Blue Ready to wound bed as directed Secondary Dressing ABD Pad 5x9 (in/in) Discharge Instruction: Cover with ABD pad Secured With Kerlix Roll Sterile or Non-Sterile 6-ply 4.5x4 (yd/yd) Discharge Instruction: Apply Kerlix as directed Compression Wrap Compression Stockings Add-Ons Electronic Signature(s) Signed: 07/19/2023 4:36:05 PM By: Midge Aver MSN RN CNS WTA Entered By: Midge Aver on 07/19/2023 12:35:09 -------------------------------------------------------------------------------- Vitals Details Patient Name: Date of Service: [name], Robert [name]. 07/19/2023 3:30 PM Medical Record Number: [id number] Patient Account Number: [id number] Date of Birth/Sex: Treating RN: Jan 24, 1966 (57 y.o. [name] Primary Care Haelee Bolen: Eleanora Neighbor Other Clinician: Referring Trenton Passow: Treating Olon Russ/Extender: Brandt Loosen in Treatment: 7 Vital Signs Time Taken: 15:23 Temperature (F): 98.2 Height (in): 73 Pulse (bpm): 89 Weight (lbs): 205 Respiratory Rate (breaths/min): 18 Body Mass Index (BMI): 27 Blood Pressure (mmHg): 175/79 Reference Range: 80 - 120 mg / dl Electronic Signature(s) Signed: 07/19/2023 4:36:05 PM By: Midge Aver MSN RN CNS WTA Entered By: Midge Aver on 07/19/2023 12:26:38

## 2023-07-26 ENCOUNTER — Encounter: Payer: Managed Care, Other (non HMO) | Attending: Physician Assistant

## 2023-07-26 DIAGNOSIS — L89610 Pressure ulcer of right heel, unstageable: Secondary | ICD-10-CM | POA: Insufficient documentation

## 2023-07-26 DIAGNOSIS — I10 Essential (primary) hypertension: Secondary | ICD-10-CM | POA: Insufficient documentation

## 2023-07-28 NOTE — Progress Notes (Signed)
KENNETT, SYMES ([id number]) [id number] Page 1 of 2 Visit Report for 07/26/2023 Physician Orders Details Patient Name: Date of Service: [name] 07/26/2023 12:00 PM Medical Record Number: [id number] Patient Account Number: [id number] Date of Birth/Sex: Treating RN: 1966/06/04 (57 y.o. [name] Primary Care Provider: Eleanora Neighbor Other Clinician: Referring Provider: Treating Provider/Extender: Brandt Loosen in Treatment: 8 The following information was scribed by: Midge Aver The information was scribed for: Allen Derry Verbal / Phone Orders: No Diagnosis Coding Follow-up Appointments Return Appointment in 1 week. Nurse Visit as needed Bathing/ Shower/ Hygiene May shower; gently cleanse wound with antibacterial soap, rinse and pat dry prior to dressing wounds Anesthetic (Use 'Patient Medications' Section for Anesthetic Order Entry) Lidocaine applied to wound bed Wound Treatment Wound #1 - Calcaneus Wound Laterality: Right Cleanser: Soap and Water 3 x Per Week/30 Days Discharge Instructions: Use as directed. Prim Dressing: Hydrofera Blue Ready Transfer Foam, 2.5x2.5 (in/in) (Generic) 3 x Per Week/30 Days ary Discharge Instructions: Apply Hydrofera Blue Ready to wound bed as directed Secondary Dressing: ABD Pad 5x9 (in/in) (Generic) 3 x Per Week/30 Days Discharge Instructions: Cover with ABD pad Secured With: Kerlix Roll Sterile or Non-Sterile 6-ply 4.5x4 (yd/yd) (Generic) 3 x Per Week/30 Days Discharge Instructions: Apply Kerlix as directed Electronic Signature(s) Signed: 07/28/2023 11:02:02 AM By: Midge Aver MSN RN CNS WTA Signed: 07/29/2023 11:25:01 AM By: Allen Derry PA-C Previous Signature: 07/27/2023 3:24:56 PM Version By: Allen Derry PA-C Previous Signature: 07/27/2023 5:02:23 PM Version By: Midge Aver MSN RN CNS WTA Entered By: Midge Aver on 07/28/2023 08:02:02 [name] ([id number])  [id number] Page 2 of 2 -------------------------------------------------------------------------------- SuperBill Details Patient Name: Date of Service: [name] 07/26/2023 Medical Record Number: [id number] Patient Account Number: [id number] Date of Birth/Sex: Treating RN: 1965/11/03 (57 y.o. [name] Primary Care Provider: Eleanora Neighbor Other Clinician: Referring Provider: Treating Provider/Extender: Brandt Loosen in Treatment: 8 Diagnosis Coding ICD-10 Codes Code Description L89.610 Pressure ulcer of right heel, unstageable I10 Essential (primary) hypertension Facility Procedures : CPT4 Code: [id number] Description: 9212 - WOUND CARE VISIT-LEV 2 EST PT Modifier: Quantity: 1 Electronic Signature(s) Signed: 07/27/2023 3:24:56 PM By: Allen Derry PA-C Signed: 07/27/2023 5:02:23 PM By: Midge Aver MSN RN CNS WTA Entered By: Midge Aver on 07/26/2023 08:49:29

## 2023-07-28 NOTE — Progress Notes (Signed)
TRAIVON, [name] ([id number]) [id number] Page 1 of 4 Visit Report for 07/26/2023 Arrival Information Details Patient Name: Date of Service: [name]. 07/26/2023 12:00 PM Medical Record Number: [id number] Patient Account Number: [id number] Date of Birth/Sex: Treating RN: [birth year] (57 y.o. [name] [name]: [name] Other Clinician: Referring [name]: Treating [name]/Extender: Brandt Loosen in Treatment: 8 Visit Information History Since Last Visit Added or deleted any medications: No Patient Arrived: Ambulatory Any new allergies or adverse reactions: No Arrival Time: 11:32 Has Dressing in Place as Prescribed: Yes Accompanied By: self Pain Present Now: No Transfer Assistance: None Patient Identification Verified: Yes Secondary Verification Process Completed: Yes Patient Requires Transmission-Based Precautions: No Patient Has Alerts: Yes Patient Alerts: Not diabetic Electronic Signature(s) Signed: 07/27/2023 5:02:23 PM By: Midge Aver MSN RN CNS WTA Entered By: Midge Aver on 07/26/2023 11:33:10 -------------------------------------------------------------------------------- Clinic Level of [name] Assessment Details Patient Name: Date of Service: [name], Robert [place] [name]. 07/26/2023 12:00 PM Medical Record Number: [id number] Patient Account Number: [id number] Date of Birth/Sex: Treating RN: 08/27/1966 (57 y.o. [name] [name]: [name] Other Clinician: Referring [name]: Treating [name]/Extender: Brandt Loosen in Treatment: 8 Clinic Level of [name] Assessment Items TOOL 4 Quantity Score X- 1 0 Use when only an EandM is performed on FOLLOW-UP visit ASSESSMENTS - Nursing Assessment / Reassessment X- 1 10 Reassessment of Co-morbidities (includes updates in patient status) X- 1 5 Reassessment of Adherence to Treatment Plan ASSESSMENTS  - Wound and Skin A ssessment / Reassessment X - Simple Wound Assessment / Reassessment - one wound 1 5 [name], [name] ([id number]) [id number] Page 2 of 4 []  - 0 Complex Wound Assessment / Reassessment - multiple wounds []  - 0 Dermatologic / Skin Assessment (not related to wound area) ASSESSMENTS - Focused Assessment []  - 0 Circumferential Edema Measurements - multi extremities []  - 0 Nutritional Assessment / Counseling / Intervention []  - 0 Lower Extremity Assessment (monofilament, tuning fork, pulses) []  - 0 Peripheral Arterial Disease Assessment (using hand held doppler) ASSESSMENTS - Ostomy and/or Continence Assessment and [name] []  - 0 Incontinence Assessment and Management []  - 0 Ostomy [name] Assessment and Management (repouching, etc.) PROCESS - Coordination of [name] X - Simple Patient / Family Education for ongoing [name] 1 15 []  - 0 Complex (extensive) Patient / Family Education for ongoing [name] X- 1 10 Staff obtains Chiropractor, Records, [name] Results / Process Orders est []  - 0 Staff telephones HHA, Nursing Homes / Clarify orders / etc []  - 0 Routine Transfer to another Facility (non-emergent condition) []  - 0 Routine Hospital Admission (non-emergent condition) []  - 0 New Admissions / Manufacturing engineer / Ordering NPWT Apligraf, etc. , []  - 0 Emergency Hospital Admission (emergent condition) X- 1 10 Simple Discharge Coordination []  - 0 Complex (extensive) Discharge Coordination PROCESS - Special Needs []  - 0 Pediatric / Minor Patient Management []  - 0 Isolation Patient Management []  - 0 Hearing / Language / Visual special needs []  - 0 Assessment of Community assistance (transportation, D/C planning, etc.) []  - 0 Additional assistance / Altered mentation []  - 0 Support Surface(s) Assessment (bed, cushion, seat, etc.) INTERVENTIONS - Wound Cleansing / Measurement X - Simple Wound Cleansing - one wound 1 5 []  - 0 Complex Wound  Cleansing - multiple wounds []  - 0 Wound Imaging (photographs - any number of wounds) []  - 0 Wound Tracing (instead of photographs) []  - 0 Simple Wound Measurement - one  wound []  - 0 Complex Wound Measurement - multiple wounds INTERVENTIONS - Wound Dressings []  - 0 Small Wound Dressing one or multiple wounds X- 1 15 Medium Wound Dressing one or multiple wounds []  - 0 Large Wound Dressing one or multiple wounds []  - 0 Application of Medications - topical []  - 0 Application of Medications - injection INTERVENTIONS - Miscellaneous []  - 0 External ear exam []  - 0 Specimen Collection (cultures, biopsies, blood, body fluids, etc.) []  - 0 Specimen(s) / Culture(s) sent or taken to Lab for analysis [name], [name] ([id number]) [id number] Page 3 of 4 []  - 0 Patient Transfer (multiple staff / Nurse, adult / Similar devices) []  - 0 Simple Staple / Suture removal (25 or less) []  - 0 Complex Staple / Suture removal (26 or more) []  - 0 Hypo / Hyperglycemic Management (close monitor of Blood Glucose) []  - 0 Ankle / Brachial Index (ABI) - do not check if billed separately []  - 0 Vital Signs Has the patient been seen at the hospital within the last three years: Yes Total Score: 75 Level Of [name]: New/Established - Level 2 Electronic Signature(s) Signed: 07/27/2023 5:02:23 PM By: Midge Aver MSN RN CNS WTA Entered By: Midge Aver on 07/26/2023 11:49:21 -------------------------------------------------------------------------------- Encounter Discharge Information Details Patient Name: Date of Service: [name], Robert [name]. 07/26/2023 12:00 PM Medical Record Number: [id number] Patient Account Number: [id number] Date of Birth/Sex: Treating RN: 31-May-1966 (57 y.o. [name] Amariss Detamore: [name] Other Clinician: Referring Sophy Mesler: Treating Kionna Brier/Extender: Brandt Loosen in Treatment: 8 Encounter Discharge  Information Items Discharge Condition: Stable Ambulatory Status: Ambulatory Discharge Destination: Home Transportation: Private Auto Accompanied By: self Schedule Follow-up Appointment: Yes Clinical Summary of [name]: Electronic Signature(s) Signed: 07/27/2023 5:02:23 PM By: Midge Aver MSN RN CNS WTA Entered By: Midge Aver on 07/26/2023 11:48:38 -------------------------------------------------------------------------------- Wound Assessment Details Patient Name: Date of Service: [name], Robert [name]. 07/26/2023 12:00 PM Medical Record Number: [id number] Patient Account Number: [id number] Date of Birth/Sex: Treating RN: 1966-05-07 (57 y.o. [name] Chidubem Chaires: [name] Other Clinician: [name] ([id number]) [id number] Page 4 of 4 Referring Qunisha Bryk: Treating Ladaja Yusupov/Extender: Brandt Loosen in Treatment: 8 Wound Status Wound Number: 1 Primary Etiology: Pressure Ulcer Wound Location: Right Calcaneus Wound Status: Open Wounding Event: Gradually Appeared Comorbid History: Hypertension Date Acquired: 05/20/2023 Weeks Of Treatment: 8 Clustered Wound: Yes Wound Measurements Length: (cm) 8 Width: (cm) 5 Depth: (cm) 0.2 Area: (cm) 31.416 Volume: (cm) 6.283 % Reduction in Area: 11.3% % Reduction in Volume: 11.3% Epithelialization: None Wound Description Classification: Category/Stage IV Exudate Amount: Medium Exudate Type: Serosanguineous Exudate Color: red, brown Foul Odor After Cleansing: No Slough/Fibrino No Wound Bed Granulation Amount: None Present (0%) Exposed Structure Necrotic Amount: Large (67-100%) Fascia Exposed: No Necrotic Quality: Eschar Fat Layer (Subcutaneous Tissue) Exposed: Yes Tendon Exposed: No Muscle Exposed: No Joint Exposed: No Bone Exposed: No Treatment Notes Wound #1 (Calcaneus) Wound Laterality: Right Cleanser Soap and Water Discharge Instruction: Use as  directed. Peri-Wound [name] Topical Primary Dressing Hydrofera Blue Ready Transfer Foam, 2.5x2.5 (in/in) Discharge Instruction: Apply Hydrofera Blue Ready to wound bed as directed Secondary Dressing ABD Pad 5x9 (in/in) Discharge Instruction: Cover with ABD pad Secured With Kerlix Roll Sterile or Non-Sterile 6-ply 4.5x4 (yd/yd) Discharge Instruction: Apply Kerlix as directed Compression Wrap Compression Stockings Add-Ons Electronic Signature(s) Signed: 07/27/2023 5:02:23 PM By: Midge Aver MSN RN CNS WTA Entered By: Midge Aver on 07/26/2023 11:33:22

## 2023-08-02 ENCOUNTER — Encounter: Payer: Managed Care, Other (non HMO) | Attending: Physician Assistant | Admitting: Physician Assistant

## 2023-08-02 DIAGNOSIS — L89610 Pressure ulcer of right heel, unstageable: Secondary | ICD-10-CM | POA: Insufficient documentation

## 2023-08-02 DIAGNOSIS — I10 Essential (primary) hypertension: Secondary | ICD-10-CM | POA: Insufficient documentation

## 2023-08-02 NOTE — Progress Notes (Signed)
ADONY, BURNINGHAM ([id number]) [id number] Page 1 of 8 Visit Report for 08/02/2023 Arrival Information Details Patient Name: Date of Service: Robert Lav [place] [name]. 08/02/2023 1:15 PM Medical Record Number: [id number] Patient Account Number: [id number] Date of Birth/Sex: Treating RN: [birth year] (57 y.o. [name] Primary Care Adiel Erney: Eleanora Neighbor Other Clinician: Referring Tya Haughey: Treating Leoda Smithhart/Extender: Brandt Loosen in Treatment: 9 Visit Information History Since Last Visit Added or deleted any medications: No Patient Arrived: Ambulatory Any new allergies or adverse reactions: No Arrival Time: 13:19 Has Dressing in Place as Prescribed: Yes Accompanied By: self Pain Present Now: No Transfer Assistance: None Patient Identification Verified: Yes Secondary Verification Process Completed: Yes Patient Requires Transmission-Based Precautions: No Patient Has Alerts: Yes Patient Alerts: Not diabetic Electronic Signature(s) Signed: 08/02/2023 4:54:56 PM By: Midge Aver MSN RN CNS WTA Entered By: Midge Aver on 08/02/2023 10:20:14 -------------------------------------------------------------------------------- Clinic Level of Care Assessment Details Patient Name: Date of Service: [name], Robert [place] [name]. 08/02/2023 1:15 PM Medical Record Number: [id number] Patient Account Number: [id number] Date of Birth/Sex: Treating RN: 1966-03-10 (57 y.o. [name] Primary Care Noraa Pickeral: Eleanora Neighbor Other Clinician: Referring Indiya Izquierdo: Treating Delores Thelen/Extender: Brandt Loosen in Treatment: 9 Clinic Level of Care Assessment Items TOOL 1 Quantity Score []  - 0 Use when EandM and Procedure is performed on INITIAL visit ASSESSMENTS - Nursing Assessment / Reassessment []  - 0 General Physical Exam (combine w/ comprehensive assessment (listed just below) when performed on new pt. evals) []  -  0 Comprehensive Assessment (HX, ROS, Risk Assessments, Wounds Hx, etc.) ASSESSMENTS - Wound and Skin Assessment / Reassessment []  - 0 Dermatologic / Skin Assessment (not related to wound area) ASSESSMENTS - Ostomy and/or Continence Assessment and Care VIDAL, KIENITZ ([id number]) [id number] Page 2 of 8 []  - 0 Incontinence Assessment and Management []  - 0 Ostomy Care Assessment and Management (repouching, etc.) PROCESS - Coordination of Care []  - 0 Simple Patient / Family Education for ongoing care []  - 0 Complex (extensive) Patient / Family Education for ongoing care []  - 0 Staff obtains Chiropractor, Records, [name] Results / Process Orders est []  - 0 Staff telephones HHA, Nursing Homes / Clarify orders / etc []  - 0 Routine Transfer to another Facility (non-emergent condition) []  - 0 Routine Hospital Admission (non-emergent condition) []  - 0 New Admissions / Manufacturing engineer / Ordering NPWT Apligraf, etc. , []  - 0 Emergency Hospital Admission (emergent condition) PROCESS - Special Needs []  - 0 Pediatric / Minor Patient Management []  - 0 Isolation Patient Management []  - 0 Hearing / Language / Visual special needs []  - 0 Assessment of Community assistance (transportation, D/C planning, etc.) []  - 0 Additional assistance / Altered mentation []  - 0 Support Surface(s) Assessment (bed, cushion, seat, etc.) INTERVENTIONS - Miscellaneous []  - 0 External ear exam []  - 0 Patient Transfer (multiple staff / Nurse, adult / Similar devices) []  - 0 Simple Staple / Suture removal (25 or less) []  - 0 Complex Staple / Suture removal (26 or more) []  - 0 Hypo/Hyperglycemic Management (do not check if billed separately) []  - 0 Ankle / Brachial Index (ABI) - do not check if billed separately Has the patient been seen at the hospital within the last three years: Yes Total Score: 0 Level Of Care: ____ Electronic Signature(s) Signed: 08/02/2023 4:54:56 PM  By: Midge Aver MSN RN CNS WTA Entered By: Midge Aver on 08/02/2023 10:44:04 -------------------------------------------------------------------------------- Encounter Discharge Information Details Patient Name: Date of Service: TA  [name], Robert [name]. 08/02/2023 1:15 PM Medical Record Number: [id number] Patient Account Number: [id number] Date of Birth/Sex: Treating RN: 1966-01-06 (57 y.o. [name] Primary Care Marshal Eskew: Eleanora Neighbor Other Clinician: Referring Daniil Labarge: Treating Challen Spainhour/Extender: Brandt Loosen in Treatment: 9 Encounter Discharge Information Items Post Procedure Vitals Discharge Condition: Stable Temperature (F): 98.2 [name], [name] ([id number]) [id number] Page 3 of 8 Ambulatory Status: Ambulatory Pulse (bpm): 55 Discharge Destination: Home Respiratory Rate (breaths/min): 18 Transportation: Private Auto Blood Pressure (mmHg): 148/92 Accompanied By: self Schedule Follow-up Appointment: Yes Clinical Summary of Care: Electronic Signature(s) Signed: 08/02/2023 4:54:56 PM By: Midge Aver MSN RN CNS WTA Entered By: Midge Aver on 08/02/2023 10:58:08 -------------------------------------------------------------------------------- Lower Extremity Assessment Details Patient Name: Date of Service: [name], Robert [name]. 08/02/2023 1:15 PM Medical Record Number: [id number] Patient Account Number: [id number] Date of Birth/Sex: Treating RN: Aug 20, 1966 (57 y.o. [name] Primary Care Riddhi Grether: Eleanora Neighbor Other Clinician: Referring Kagen Kunath: Treating Gwynevere Lizana/Extender: Brandt Loosen in Treatment: 9 Electronic Signature(s) Signed: 08/02/2023 4:54:56 PM By: Midge Aver MSN RN CNS WTA Entered By: Midge Aver on 08/02/2023 10:30:36 -------------------------------------------------------------------------------- Multi Wound Chart Details Patient Name: Date of Service: [name], Robert [name]. 08/02/2023 1:15 PM Medical Record Number: [id number] Patient Account Number: [id number] Date of Birth/Sex: Treating RN: Feb 12, 1966 (57 y.o. [name] Primary Care Hatley Henegar: Eleanora Neighbor Other Clinician: Referring Hannan Hutmacher: Treating Berta Denson/Extender: Brandt Loosen in Treatment: 9 Vital Signs Height(in): 73 Pulse(bpm): 55 Weight(lbs): 205 Blood Pressure(mmHg): 148/92 Body Mass Index(BMI): 27 Temperature(F): 98.2 Respiratory Rate(breaths/min): 18 [1:Photos:] [N/A:N/A] Right Calcaneus N/A N/A Wound Location: Gradually Appeared N/A N/A Wounding Event: Pressure Ulcer N/A N/A Primary Etiology: Hypertension N/A N/A Comorbid History: 05/20/2023 N/A N/A Date Acquired: 9 N/A N/A Weeks of Treatment: Open N/A N/A Wound Status: No N/A N/A Wound Recurrence: Yes N/A N/A Clustered Wound: 6x2.3x0.1 N/A N/A Measurements L x W x D (cm) 10.838 N/A N/A A (cm) : rea 1.084 N/A N/A Volume (cm) : 69.40% N/A N/A % Reduction in A rea: 84.70% N/A N/A % Reduction in Volume: Category/Stage IV N/A N/A Classification: Medium N/A N/A Exudate A mount: Serosanguineous N/A N/A Exudate Type: red, brown N/A N/A Exudate Color: None Present (0%) N/A N/A Granulation A mount: Large (67-100%) N/A N/A Necrotic A mount: Eschar N/A N/A Necrotic Tissue: Fat Layer (Subcutaneous Tissue): Yes N/A N/A Exposed Structures: Fascia: No Tendon: No Muscle: No Joint: No Bone: No None N/A N/A Epithelialization: Treatment Notes Electronic Signature(s) Signed: 08/02/2023 4:54:56 PM By: Midge Aver MSN RN CNS WTA Entered By: Midge Aver on 08/02/2023 10:32:00 -------------------------------------------------------------------------------- Multi-Disciplinary Care Plan Details Patient Name: Date of Service: [name], Robert [name]. 08/02/2023 1:15 PM Medical Record Number: [id number] Patient Account Number: [id number] Date of Birth/Sex: Treating  RN: 02-03-1966 (57 y.o. [name] Primary Care Mulki Roesler: Eleanora Neighbor Other Clinician: Referring Dayleen Beske: Treating Liliani Bobo/Extender: Brandt Loosen in Treatment: [address] Inactive Necrotic Tissue [name], [name] ([id number]) [id number] Page 5 of 8 Nursing Diagnoses: Impaired tissue integrity related to necrotic/devitalized tissue Knowledge deficit related to management of necrotic/devitalized tissue Goals: Necrotic/devitalized tissue will be minimized in the wound bed Date Initiated: 05/31/2023 Date Inactivated: 06/28/2023 Target Resolution Date: 06/30/2023 Goal Status: Met Patient/caregiver will verbalize understanding of reason and process for debridement of necrotic tissue Date Initiated: 05/31/2023 Target Resolution Date: 08/21/2023 Goal Status: Active Interventions: Assess patient pain level pre-, during and post procedure and prior to discharge Provide education  on necrotic tissue and debridement process Treatment Activities: Apply topical anesthetic as ordered : 05/31/2023 Enzymatic debridement : 05/31/2023 Notes: Wound/Skin Impairment Nursing Diagnoses: Impaired tissue integrity Knowledge deficit related to ulceration/compromised skin integrity Goals: Patient/caregiver will verbalize understanding of skin care regimen Date Initiated: 05/31/2023 Date Inactivated: 06/28/2023 Target Resolution Date: 06/30/2023 Goal Status: Met Ulcer/skin breakdown will have a volume reduction of 30% by week 4 Date Initiated: 05/31/2023 Date Inactivated: 06/28/2023 Target Resolution Date: 06/30/2023 Goal Status: Met Ulcer/skin breakdown will have a volume reduction of 50% by week 8 Date Initiated: 05/31/2023 Date Inactivated: 08/02/2023 Target Resolution Date: 07/31/2023 Goal Status: Met Ulcer/skin breakdown will have a volume reduction of 80% by week 12 Date Initiated: 05/31/2023 Target Resolution Date: 08/30/2023 Goal Status:  Active Ulcer/skin breakdown will heal within 14 weeks Date Initiated: 05/31/2023 Target Resolution Date: 09/06/2023 Goal Status: Active Interventions: Assess patient/caregiver ability to obtain necessary supplies Assess patient/caregiver ability to perform ulcer/skin care regimen upon admission and as needed Assess ulceration(s) every visit Provide education on ulcer and skin care Notes: Electronic Signature(s) Signed: 08/02/2023 4:54:56 PM By: Midge Aver MSN RN CNS WTA Entered By: Midge Aver on 08/02/2023 10:57:04 -------------------------------------------------------------------------------- Pain Assessment Details Patient Name: Date of Service: [name], Robert [name]. 08/02/2023 1:15 PM Medical Record Number: [id number] Patient Account Number: [id number] [name], [name] ([id number]) [id number].pdf Page 6 of 8 Date of Birth/Sex: Treating RN: [birth year] (57 y.o. [name] Primary Care Rusell Meneely: Other Clinician: Eleanora Neighbor Referring Bienvenido Proehl: Treating Davonda Ausley/Extender: Brandt Loosen in Treatment: 9 Active Problems Location of Pain Severity and Description of Pain Patient Has Paino No Site Locations Pain Management and Medication Current Pain Management: Electronic Signature(s) Signed: 08/02/2023 4:54:56 PM By: Midge Aver MSN RN CNS WTA Entered By: Midge Aver on 08/02/2023 10:22:25 -------------------------------------------------------------------------------- Patient/Caregiver Education Details Patient Name: Date of Service: TA [facility] 11/11/2024andnbsp1:15 PM Medical Record Number: [id number] Patient Account Number: [id number] Date of Birth/Gender: Treating RN: [birth year] (57 y.o. [name] Primary Care Physician: Eleanora Neighbor Other Clinician: Referring Physician: Treating Physician/Extender: Brandt Loosen in Treatment: 9 Education Assessment Education  Provided To: Patient Education Topics Provided Wound Debridement: Handouts: Wound Debridement Methods: Explain/Verbal Responses: State content correctly Wound/Skin Impairment: Handouts: Caring for Your Ulcer Methods: Explain/Verbal [name], [name] ([id number]) [id number] Page 7 of 8 Responses: State content correctly Electronic Signature(s) Signed: 08/02/2023 4:54:56 PM By: Midge Aver MSN RN CNS WTA Entered By: Midge Aver on 08/02/2023 10:57:21 -------------------------------------------------------------------------------- Wound Assessment Details Patient Name: Date of Service: [name], Robert [name]. 08/02/2023 1:15 PM Medical Record Number: [id number] Patient Account Number: [id number] Date of Birth/Sex: Treating RN: 1965-10-29 (56 y.o. [name] Primary Care Dezmen Alcock: Eleanora Neighbor Other Clinician: Referring Keri Veale: Treating Ryiah Bellissimo/Extender: Brandt Loosen in Treatment: 9 Wound Status Wound Number: 1 Primary Etiology: Pressure Ulcer Wound Location: Right Calcaneus Wound Status: Open Wounding Event: Gradually Appeared Comorbid History: Hypertension Date Acquired: 05/20/2023 Weeks Of Treatment: 9 Clustered Wound: Yes Photos Wound Measurements Length: (cm) 6 Width: (cm) 2.3 Depth: (cm) 0.1 Area: (cm) 10.838 Volume: (cm) 1.084 % Reduction in Area: 69.4% % Reduction in Volume: 84.7% Epithelialization: None Wound Description Classification: Category/Stage IV Exudate Amount: Medium Exudate Type: Serosanguineous Exudate Color: red, brown Foul Odor After Cleansing: No Slough/Fibrino No Wound Bed Granulation Amount: None Present (0%) Exposed Structure Necrotic Amount: Large (67-100%) Fascia Exposed: No Necrotic Quality: Eschar Fat Layer (Subcutaneous Tissue) Exposed: Yes Tendon Exposed: No Muscle Exposed: No Joint Exposed: No  Bone Exposed: No [name], [name] ([id number])  [id number] Page 8 of 8 Treatment Notes Wound #1 (Calcaneus) Wound Laterality: Right Cleanser Soap and Water Discharge Instruction: Use as directed. Peri-Wound Care Topical Primary Dressing Hydrofera Blue Ready Transfer Foam, 2.5x2.5 (in/in) Discharge Instruction: Apply Hydrofera Blue Ready to wound bed as directed Secondary Dressing ABD Pad 5x9 (in/in) Discharge Instruction: Cover with ABD pad Secured With Kerlix Roll Sterile or Non-Sterile 6-ply 4.5x4 (yd/yd) Discharge Instruction: Apply Kerlix as directed Compression Wrap Compression Stockings Add-Ons Electronic Signature(s) Signed: 08/02/2023 4:54:56 PM By: Midge Aver MSN RN CNS WTA Entered By: Midge Aver on 08/02/2023 10:30:16 -------------------------------------------------------------------------------- Vitals Details Patient Name: Date of Service: [name], Robert [name]. 08/02/2023 1:15 PM Medical Record Number: [id number] Patient Account Number: [id number] Date of Birth/Sex: Treating RN: [birth year] (57 y.o. [name] Primary Care Zyair Rhein: Eleanora Neighbor Other Clinician: Referring Ketzia Guzek: Treating Jerrid Forgette/Extender: Brandt Loosen in Treatment: 9 Vital Signs Time Taken: 13:20 Temperature (F): 98.2 Height (in): 73 Pulse (bpm): 55 Weight (lbs): 205 Respiratory Rate (breaths/min): 18 Body Mass Index (BMI): 27 Blood Pressure (mmHg): 148/92 Reference Range: 80 - 120 mg / dl Electronic Signature(s) Signed: 08/02/2023 4:54:56 PM By: Midge Aver MSN RN CNS WTA Entered By: Midge Aver on 08/02/2023 10:22:12

## 2023-08-02 NOTE — Progress Notes (Addendum)
JAKEVIOUS, LICAUSI ([id number]) [id number] Page 1 of 7 Visit Report for 08/02/2023 Chief Complaint Document Details Patient Name: Date of Service: Robert Cleveland T. 08/02/2023 1:15 PM Medical Record Number: [id number] Patient Account Number: [id number] Date of Birth/Sex: Treating RN: 1966/02/08 (57 y.o. [name] Primary Care Provider: Eleanora Neighbor Other Clinician: Referring Provider: Treating Provider/Extender: Brandt Loosen in Treatment: 9 Information Obtained from: Patient Chief Complaint Right heel ulcer Electronic Signature(s) Signed: 08/02/2023 1:35:46 PM By: Allen Derry PA-C Entered By: Allen Derry on 08/02/2023 10:35:46 -------------------------------------------------------------------------------- Debridement Details Patient Name: Date of Service: [name], [name] 08/02/2023 1:15 PM Medical Record Number: [id number] Patient Account Number: [id number] Date of Birth/Sex: Treating RN: Feb 04, 1966 (57 y.o. [name] Primary Care Provider: Eleanora Neighbor Other Clinician: Referring Provider: Treating Provider/Extender: Brandt Loosen in Treatment: 9 Debridement Performed for Assessment: Wound #1 Right Calcaneus Performed By: Physician Allen Derry, PA-C The following information was scribed by: Midge Aver The information was scribed for: Allen Derry Debridement Type: Debridement Level of Consciousness (Pre-procedure): Awake and Alert Pre-procedure Verification/Time Out Yes - 13:40 Taken: Start Time: 13:40 Pain Control: Lidocaine 4% T opical Solution Percent of Wound Bed Debrided: 100% T Area Debrided (cm): otal 10.83 Tissue and other material debrided: Viable, Non-Viable, Slough, Subcutaneous, Biofilm, Slough Level: Skin/Subcutaneous Tissue Debridement Description: Excisional Instrument: Curette Bleeding: Moderate Hemostasis Achieved: Pressure TARIF, MCMANAWAY T  ([id number]) [id number] Page 2 of 7 Procedural Pain: 0 Post Procedural Pain: 0 Response to Treatment: Procedure was tolerated well Level of Consciousness (Post- Awake and Alert procedure): Post Debridement Measurements of Total Wound Length: (cm) 6 Stage: Category/Stage IV Width: (cm) 2.3 Depth: (cm) 0.1 Volume: (cm) 1.084 Character of Wound/Ulcer Post Debridement: Stable Post Procedure Diagnosis Same as Pre-procedure Electronic Signature(s) Signed: 08/02/2023 4:31:01 PM By: Allen Derry PA-C Signed: 08/02/2023 4:54:56 PM By: Midge Aver MSN RN CNS WTA Entered By: Midge Aver on 08/02/2023 10:42:02 -------------------------------------------------------------------------------- HPI Details Patient Name: Date of Service: [name], [name] 08/02/2023 1:15 PM Medical Record Number: [id number] Patient Account Number: [id number] Date of Birth/Sex: Treating RN: 10-31-1965 (57 y.o. [name] Primary Care Provider: Eleanora Neighbor Other Clinician: Referring Provider: Treating Provider/Extender: Brandt Loosen in Treatment: 9 History of Present Illness HPI Description: 05-31-2023 patient presents today for initial evaluation here in the clinic concerning issues that he is having with an eschar over the right heel. Unfortunately this came on seemingly fairly suddenly when he was at work. He does work around [employer] but states he was not doing anything directly with them at that point. Again I am unsure exactly what went on here and to what degree the chemicals could have played a role in this but this almost looks to me more like damage secondary to a chemical burn versus a strict pressure ulcer although I am unsure to be certain. The issue here is simply that he has 3 areas that are somewhat separated by some skin in between although to some degree connected as well which is very necrotic and eschar covered and to be  honest getting any dressing to this is not really going to be of benefit at this point. Fortunately I do not see any signs of active infection at this time which is good news. With that being said I do think that this is gena be difficult to get heal until we get the eschar off of the area. The patient voiced understanding and his  wife was present during the evaluation today as well. He does have hypertension but no other major medical problems. T my knowledge this is not a workers o comp case although I am unsure whether or not it should be to be honest. 06-08-2023 upon evaluation today patient appears to be doing well currently in regard to his wound. He has been tolerating the dressing changes without complication. Fortunately there does not appear to be any signs of active infection locally or systemically which is great news. With that being said he still has a pretty much eschar covering he did get the Santyl after a bit of a fight although he only got 2 tubes this may not last a full month but hopefully we will need it the whole month and this will work out just fine for [place] will make do with what we can get. 06-15-2023 upon evaluation today patient's wound is actually starting to soften up as far as the eschar is concerned actually think we may be able to get some of that off today and I discussed that with the patient. He is in agreement with given the a shot to do this. 06-22-2023 upon evaluation today patient appears to be doing much better in regard to his heel actually feel like most of the wounds are actually making significant improvement here which is great news and are very pleased in that regard. I do not see any signs of active infection looking or systemically at this time which is great news and in general I do believe that we are making good headway towards complete closure. 10-7 24 upon evaluation today patient appears to be doing well currently in regard to his wound. Again the  heel is showing signs of excellent improvement and actually very pleased with where we stand I do believe that he is doing quite well currently. 07-05-2023 upon evaluation today patient appears to be doing well currently in regard to his wound. He is actually showing signs of improvement which is great news and in general I do believe that we are making headway towards complete closure which is great news. I think that he is doing well currently with the Iodoflex. 07-12-2023 upon evaluation patient's wound bed actually showed signs of good granulation epithelization at this point. He is tolerating dressing changes without complication and good news is I feel like they were moving in the right direction here. I do not see any signs of active infection at this time. 07-19-2023 upon evaluation today patient appears to be doing very well in regard to his wound the heel actually showing signs of dramatic improvement at this HUSSAM, HOWDEN ([id number]) [id number] Page 3 of 7 time. There does not appear to be any signs of active infection which is good news. No fevers, chills, nausea, vomiting, or diarrhea. 08-02-2023 upon evaluation today patient appears to be doing well currently in regard to his wound. He has been tolerating the dressing changes without complication. Fortunately there does not appear to be any signs of active infection locally or systemically which is great news and in general I think that we are moving in the right direction here. There is some biofilm noted on the surface of the wound. Electronic Signature(s) Signed: 08/02/2023 1:47:06 PM By: Allen Derry PA-C Entered By: Allen Derry on 08/02/2023 10:47:06 -------------------------------------------------------------------------------- Physical Exam Details Patient Name: Date of Service: Robert Cleveland T. 08/02/2023 1:15 PM Medical Record Number: [id number] Patient Account Number: [id number] Date of  Birth/Sex: Treating  RN: [birth year] (57 y.o. [name] Primary Care Provider: Eleanora Neighbor Other Clinician: Referring Provider: Treating Provider/Extender: Brandt Loosen in Treatment: 9 Constitutional Well-nourished and well-hydrated in no acute distress. Respiratory normal breathing without difficulty. Psychiatric this patient is able to make decisions and demonstrates good insight into disease process. Alert and Oriented x 3. pleasant and cooperative. Notes I did perform debridement to clear away some of the necrotic debris including the biofilm on the surface of the wound along with slough the patient tolerated that today without complication and postdebridement wound bed appears to be doing much better which is great news. No fevers, chills, nausea, vomiting, or diarrhea. Electronic Signature(s) Signed: 08/02/2023 1:47:31 PM By: Allen Derry PA-C Entered By: Allen Derry on 08/02/2023 10:47:31 -------------------------------------------------------------------------------- Physician Orders Details Patient Name: Date of Service: [name], [name] 08/02/2023 1:15 PM Medical Record Number: [id number] Patient Account Number: [id number] Date of Birth/Sex: Treating RN: 02-15-1966 (57 y.o. [name] Primary Care Provider: Eleanora Neighbor Other Clinician: Referring Provider: Treating Provider/Extender: Brandt Loosen in Treatment: 9 The following information was scribed by: Midge Aver The information was scribed for: Jameire, Hoots, Silvestre Gunner ([id number]) [id number] Page 4 of 7 Verbal / Phone Orders: No Diagnosis Coding ICD-10 Coding Code Description L89.610 Pressure ulcer of right heel, unstageable I10 Essential (primary) hypertension Follow-up Appointments Return Appointment in 1 week. Nurse Visit as needed Bathing/ Shower/ Hygiene May shower; gently cleanse wound with  antibacterial soap, rinse and pat dry prior to dressing wounds Anesthetic (Use 'Patient Medications' Section for Anesthetic Order Entry) Lidocaine applied to wound bed Wound Treatment Wound #1 - Calcaneus Wound Laterality: Right Cleanser: Soap and Water 3 x Per Week/30 Days Discharge Instructions: Use as directed. Prim Dressing: Hydrofera Blue Ready Transfer Foam, 2.5x2.5 (in/in) (Generic) 3 x Per Week/30 Days ary Discharge Instructions: Apply Hydrofera Blue Ready to wound bed as directed Secondary Dressing: ABD Pad 5x9 (in/in) (Generic) 3 x Per Week/30 Days Discharge Instructions: Cover with ABD pad Secured With: Kerlix Roll Sterile or Non-Sterile 6-ply 4.5x4 (yd/yd) (Generic) 3 x Per Week/30 Days Discharge Instructions: Apply Kerlix as directed Electronic Signature(s) Signed: 08/02/2023 4:31:01 PM By: Allen Derry PA-C Signed: 08/02/2023 4:54:56 PM By: Midge Aver MSN RN CNS WTA Entered By: Midge Aver on 08/02/2023 10:43:48 -------------------------------------------------------------------------------- Problem List Details Patient Name: Date of Service: [name], [name] 08/02/2023 1:15 PM Medical Record Number: [id number] Patient Account Number: [id number] Date of Birth/Sex: Treating RN: June 21, 1966 (57 y.o. [name] Primary Care Provider: Eleanora Neighbor Other Clinician: Referring Provider: Treating Provider/Extender: Brandt Loosen in Treatment: 9 Active Problems ICD-10 Encounter Code Description Active Date MDM Diagnosis L89.610 Pressure ulcer of right heel, unstageable 05/31/2023 No Yes I10 Essential (primary) hypertension 05/31/2023 No Yes EMZIE, GOKEY ([id number]) [id number] Page 5 of 7 Inactive Problems Resolved Problems Electronic Signature(s) Signed: 08/02/2023 1:35:42 PM By: Allen Derry PA-C Entered By: Allen Derry on 08/02/2023  10:35:42 -------------------------------------------------------------------------------- Progress Note Details Patient Name: Date of Service: [name], [name] 08/02/2023 1:15 PM Medical Record Number: [id number] Patient Account Number: [id number] Date of Birth/Sex: Treating RN: [birth year] (57 y.o. [name] Primary Care Provider: Eleanora Neighbor Other Clinician: Referring Provider: Treating Provider/Extender: Brandt Loosen in Treatment: 9 Subjective Chief Complaint Information obtained from Patient Right heel ulcer History of Present Illness (HPI) 05-31-2023 patient presents today for initial evaluation here in the clinic concerning issues that he is having with  an eschar over the right heel. Unfortunately this came on seemingly fairly suddenly when he was at work. He does work around [employer] but states he was not doing anything directly with them at that point. Again I am unsure exactly what went on here and to what degree the chemicals could have played a role in this but this almost looks to me more like damage secondary to a chemical burn versus a strict pressure ulcer although I am unsure to be certain. The issue here is simply that he has 3 areas that are somewhat separated by some skin in between although to some degree connected as well which is very necrotic and eschar covered and to be honest getting any dressing to this is not really going to be of benefit at this point. Fortunately I do not see any signs of active infection at this time which is good news. With that being said I do think that this is gena be difficult to get heal until we get the eschar off of the area. The patient voiced understanding and his wife was present during the evaluation today as well. He does have hypertension but no other major medical problems. T my knowledge this is not a workers comp o case although I am unsure whether or not it should be to be  honest. 06-08-2023 upon evaluation today patient appears to be doing well currently in regard to his wound. He has been tolerating the dressing changes without complication. Fortunately there does not appear to be any signs of active infection locally or systemically which is great news. With that being said he still has a pretty much eschar covering he did get the Santyl after a bit of a fight although he only got 2 tubes this may not last a full month but hopefully we will need it the whole month and this will work out just fine for [place] will make do with what we can get. 06-15-2023 upon evaluation today patient's wound is actually starting to soften up as far as the eschar is concerned actually think we may be able to get some of that off today and I discussed that with the patient. He is in agreement with given the a shot to do this. 06-22-2023 upon evaluation today patient appears to be doing much better in regard to his heel actually feel like most of the wounds are actually making significant improvement here which is great news and are very pleased in that regard. I do not see any signs of active infection looking or systemically at this time which is great news and in general I do believe that we are making good headway towards complete closure. 10-7 24 upon evaluation today patient appears to be doing well currently in regard to his wound. Again the heel is showing signs of excellent improvement and actually very pleased with where we stand I do believe that he is doing quite well currently. 07-05-2023 upon evaluation today patient appears to be doing well currently in regard to his wound. He is actually showing signs of improvement which is great news and in general I do believe that we are making headway towards complete closure which is great news. I think that he is doing well currently with the Iodoflex. 07-12-2023 upon evaluation patient's wound bed actually showed signs of good granulation  epithelization at this point. He is tolerating dressing changes without complication and good news is I feel like they were moving in the right direction here. I  do not see any signs of active infection at this time. 07-19-2023 upon evaluation today patient appears to be doing very well in regard to his wound the heel actually showing signs of dramatic improvement at this time. There does not appear to be any signs of active infection which is good news. No fevers, chills, nausea, vomiting, or diarrhea. 08-02-2023 upon evaluation today patient appears to be doing well currently in regard to his wound. He has been tolerating the dressing changes without complication. Fortunately there does not appear to be any signs of active infection locally or systemically which is great news and in general I think that we are moving in the right direction here. There is some biofilm noted on the surface of the wound. ELIGE, STOVES ([id number]) [id number] Page 6 of 7 Objective Constitutional Well-nourished and well-hydrated in no acute distress. Vitals Time Taken: 1:20 PM, Height: 73 in, Weight: 205 lbs, BMI: 27, Temperature: 98.2 F, Pulse: 55 bpm, Respiratory Rate: 18 breaths/min, Blood Pressure: 148/92 mmHg. Respiratory normal breathing without difficulty. Psychiatric this patient is able to make decisions and demonstrates good insight into disease process. Alert and Oriented x 3. pleasant and cooperative. General Notes: I did perform debridement to clear away some of the necrotic debris including the biofilm on the surface of the wound along with slough the patient tolerated that today without complication and postdebridement wound bed appears to be doing much better which is great news. No fevers, chills, nausea, vomiting, or diarrhea. Integumentary (Hair, Skin) Wound #1 status is Open. Original cause of wound was Gradually Appeared. The date acquired was: 05/20/2023. The wound  has been in treatment 9 weeks. The wound is located on the Right Calcaneus. The wound measures 6cm length x 2.3cm width x 0.1cm depth; 10.838cm^2 area and 1.084cm^3 volume. There is Fat Layer (Subcutaneous Tissue) exposed. There is a medium amount of serosanguineous drainage noted. There is no granulation within the wound bed. There is a large (67-100%) amount of necrotic tissue within the wound bed including Eschar. Assessment Active Problems ICD-10 Pressure ulcer of right heel, unstageable Essential (primary) hypertension Procedures Wound #1 Pre-procedure diagnosis of Wound #1 is a Pressure Ulcer located on the Right Calcaneus . There was a Excisional Skin/Subcutaneous Tissue Debridement with a total area of 10.83 sq cm performed by Allen Derry, PA-C. With the following instrument(s): Curette to remove Viable and Non-Viable tissue/material. Material removed includes Subcutaneous Tissue, Slough, and Biofilm after achieving pain control using Lidocaine 4% T opical Solution. No specimens were taken. A time out was conducted at 13:40, prior to the start of the procedure. A Moderate amount of bleeding was controlled with Pressure. The procedure was tolerated well with a pain level of 0 throughout and a pain level of 0 following the procedure. Post Debridement Measurements: 6cm length x 2.3cm width x 0.1cm depth; 1.084cm^3 volume. Post debridement Stage noted as Category/Stage IV. Character of Wound/Ulcer Post Debridement is stable. Post procedure Diagnosis Wound #1: Same as Pre-Procedure Plan Follow-up Appointments: Return Appointment in 1 week. Nurse Visit as needed Bathing/ Shower/ Hygiene: May shower; gently cleanse wound with antibacterial soap, rinse and pat dry prior to dressing wounds Anesthetic (Use 'Patient Medications' Section for Anesthetic Order Entry): Lidocaine applied to wound bed WOUND #1: - Calcaneus Wound Laterality: Right Cleanser: Soap and Water 3 x Per Week/30  Days Discharge Instructions: Use as directed. Prim Dressing: Hydrofera Blue Ready Transfer Foam, 2.5x2.5 (in/in) (Generic) 3 x Per Week/30 Days ary Discharge Instructions: Apply Hydrofera Blue Ready  to wound bed as directed Secondary Dressing: ABD Pad 5x9 (in/in) (Generic) 3 x Per Week/30 Days Discharge Instructions: Cover with ABD pad Secured With: Kerlix Roll Sterile or Non-Sterile 6-ply 4.5x4 (yd/yd) (Generic) 3 x Per Week/30 Days Discharge Instructions: Apply Kerlix as directed 1. I would recommend based on what we are seeing that we have the patient going continue to monitor for any evidence of infection or worsening. Based on [place] ([id number]) [id number] Page 7 of 7 what I see I do believe that the patient is making really good headway here towards closure which is great news. 2. I am going to recommend we continue with the [facility] which I think has been doing really well for him. The patient is in agreement with that plan. 3. I am also going to recommend continued and appropriate offloading he is doing an awesome job we do still have him out of work at this point. We will see patient back for reevaluation in 1 week here in the clinic. If anything worsens or changes patient will contact our office for additional recommendations. Electronic Signature(s) Signed: 08/02/2023 1:49:52 PM By: Allen Derry PA-C Entered By: Allen Derry on 08/02/2023 10:49:52 -------------------------------------------------------------------------------- SuperBill Details Patient Name: Date of Service: [name], [name] 08/02/2023 Medical Record Number: [id number] Patient Account Number: [id number] Date of Birth/Sex: Treating RN: 10/16/1965 (57 y.o. [name] Primary Care Provider: Eleanora Neighbor Other Clinician: Referring Provider: Treating Provider/Extender: Brandt Loosen in Treatment: 9 Diagnosis Coding ICD-10 Codes Code  Description L89.610 Pressure ulcer of right heel, unstageable I10 Essential (primary) hypertension Facility Procedures : CPT4 Code: [id number] Description: 11042 - DEB SUBQ TISSUE 20 SQ CM/< ICD-10 Diagnosis Description L89.610 Pressure ulcer of right heel, unstageable I10 Essential (primary) hypertension Modifier: Quantity: 1 Physician Procedures : CPT4 Code Description Modifier [id number] 11042 - WC PHYS SUBQ TISS 20 SQ CM ICD-10 Diagnosis Description L89.610 Pressure ulcer of right heel, unstageable I10 Essential (primary) hypertension Quantity: 1 Electronic Signature(s) Signed: 08/02/2023 1:50:06 PM By: Allen Derry PA-C Entered By: Allen Derry on 08/02/2023 10:50:06

## 2023-08-09 ENCOUNTER — Encounter: Payer: Managed Care, Other (non HMO) | Admitting: Physician Assistant

## 2023-08-09 DIAGNOSIS — L89610 Pressure ulcer of right heel, unstageable: Secondary | ICD-10-CM | POA: Diagnosis not present

## 2023-08-09 NOTE — Progress Notes (Addendum)
Robert, [name] ([id number]) [id number] Page 1 of 7 Visit Report for 08/09/2023 Chief Complaint Document Details Patient Name: Date of Service: [name]. 08/09/2023 1:15 PM Medical Record Number: [id number] Patient Account Number: [id number] Date of Birth/Sex: Treating RN: 1966/07/14 (57 y.o. [name] Primary Care Provider: Eleanora [name] Other Clinician: Referring Provider: Treating Provider/Extender: [name] in [name]: 10 Information Obtained from: Patient Chief Complaint Right heel ulcer Electronic Signature(s) Signed: 08/09/2023 1:31:57 PM By: Robert Derry PA-C Entered By: [name] on 08/09/2023 13:31:57 -------------------------------------------------------------------------------- Debridement Details Patient Name: Date of Service: [name], Robert [name]. 08/09/2023 1:15 PM Medical Record Number: [id number] Patient Account Number: [id number] Date of Birth/Sex: Treating RN: November 07, 1965 (57 y.o. [name] Primary Care Provider: Eleanora [name] Other Clinician: Referring Provider: Treating Provider/Extender: [name] in [name]: 10 Debridement Performed for Assessment: Wound #1 Right Calcaneus Performed By: Physician Robert Derry, PA-C The following information was scribed by: [name] The information was scribed for: [name] Debridement Type: Debridement Level of Consciousness (Pre-procedure): Awake and Alert Pre-procedure Verification/Time Out Yes - 13:50 Taken: Start Time: 13:50 Pain Control: Lidocaine 4% [name] opical Solution Percent of Wound Bed Debrided: 100% [name] Area Debrided (cm): otal 6.87 Tissue and other material debrided: Viable, Non-Viable, Slough, Subcutaneous, Skin: Dermis , Skin: Epidermis, Slough Level: Skin/Subcutaneous Tissue Debridement Description: Excisional Instrument: Curette Bleeding: Minimum Hemostasis Achieved:  Pressure [name], [name] ([id number]) [id number] Page 2 of 7 Procedural Pain: 0 Post Procedural Pain: 1 Response to [name]: Procedure was tolerated well Level of Consciousness (Post- Awake and Alert procedure): Post Debridement Measurements of Total Wound Length: (cm) 3.5 Stage: Category/Stage IV Width: (cm) 2.5 Depth: (cm) 0.1 Volume: (cm) 0.687 Character of Wound/Ulcer Post Debridement: Stable Post Procedure Diagnosis Same as Pre-procedure Electronic Signature(s) Signed: 08/10/2023 4:00:09 PM By: Robert Derry PA-C Signed: 08/13/2023 12:46:12 PM By: Robert Aver MSN RN CNS WTA Entered By: [name] on 08/09/2023 13:51:24 -------------------------------------------------------------------------------- HPI Details Patient Name: Date of Service: [name], Robert [name]. 08/09/2023 1:15 PM Medical Record Number: [id number] Patient Account Number: [id number] Date of Birth/Sex: Treating RN: 10/13/1965 (57 y.o. [name] Primary Care Provider: Eleanora [name] Other Clinician: Referring Provider: Treating Provider/Extender: [name] in [name]: 10 History of Present Illness HPI Description: 05-31-2023 patient presents today for initial evaluation here in the clinic concerning issues that he is having with an eschar over the right heel. Unfortunately this came on seemingly fairly suddenly when he was at work. He does work around [employer] but states he was not doing anything directly with them at that point. Again I am unsure exactly what went on here and to what degree the chemicals could have played a role in this but this almost looks to me more like damage secondary to a chemical burn versus a strict pressure ulcer although I am unsure to be certain. The issue here is simply that he has 3 areas that are somewhat separated by some skin in between although to some degree connected as well which is very necrotic and eschar  covered and to be honest getting any dressing to this is not really going to be of benefit at this point. Fortunately I do not see any signs of active infection at this time which is good news. With that being said I do think that this is gena be difficult to get heal until we get the eschar off of the area. The patient  voiced understanding and his wife was present during the evaluation today as well. He does have hypertension but no other major medical problems. [name] my knowledge this is not a workers o comp case although I am unsure whether or not it should be to be honest. 06-08-2023 upon evaluation today patient appears to be doing well currently in regard to his wound. He has been tolerating the dressing changes without complication. Fortunately there does not appear to be any signs of active infection locally or systemically which is great news. With that being said he still has a pretty much eschar covering he did get the Santyl after a bit of a fight although he only got 2 tubes this may not last a full month but hopefully we will need it the whole month and this will work out just fine for [place] will make do with what we can get. 06-15-2023 upon evaluation today patient's wound is actually starting to soften up as far as the eschar is concerned actually think we may be able to get some of that off today and I discussed that with the patient. He is in agreement with given the a shot to do this. 06-22-2023 upon evaluation today patient appears to be doing much better in regard to his heel actually feel like most of the wounds are actually making significant improvement here which is great news and are very pleased in that regard. I do not see any signs of active infection looking or systemically at this time which is great news and in general I do believe that we are making good headway towards complete closure. 10-7 24 upon evaluation today patient appears to be doing well currently in regard to his  wound. Again the heel is showing signs of excellent improvement and actually very pleased with where we stand I do believe that he is doing quite well currently. 07-05-2023 upon evaluation today patient appears to be doing well currently in regard to his wound. He is actually showing signs of improvement which is great news and in general I do believe that we are making headway towards complete closure which is great news. I think that he is doing well currently with the Iodoflex. 07-12-2023 upon evaluation patient's wound bed actually showed signs of good granulation epithelization at this point. He is tolerating dressing changes without complication and good news is I feel like they were moving in the right direction here. I do not see any signs of active infection at this time. 07-19-2023 upon evaluation today patient appears to be doing very well in regard to his wound the heel actually showing signs of dramatic improvement at this [name], [name] ([id number]) [id number] Page 3 of 7 time. There does not appear to be any signs of active infection which is good news. No fevers, chills, nausea, vomiting, or diarrhea. 08-02-2023 upon evaluation today patient appears to be doing well currently in regard to his wound. He has been tolerating the dressing changes without complication. Fortunately there does not appear to be any signs of active infection locally or systemically which is great news and in general I think that we are moving in the right direction here. There is some biofilm noted on the surface of the wound. 08-09-2023 upon evaluation today patient appears to be doing well currently in regard to his wound. He has been tolerating the dressing changes without complication. Fortunately I do not see any signs of active infection locally or systemically at this time which is great  news. No fevers, chills, nausea, vomiting, or diarrhea. Electronic Signature(s) Signed:  08/09/2023 2:02:35 PM By: Robert Derry PA-C Entered By: [name] on 08/09/2023 14:02:34 -------------------------------------------------------------------------------- Physical Exam Details Patient Name: Date of Service: Robert Lillette Boxer [name]. 08/09/2023 1:15 PM Medical Record Number: [id number] Patient Account Number: [id number] Date of Birth/Sex: Treating RN: [birth year] (57 y.o. [name] Primary Care Provider: Eleanora [name] Other Clinician: Referring Provider: Treating Provider/Extender: [name]: 10 Constitutional Well-nourished and well-hydrated in no acute distress. Respiratory normal breathing without difficulty. Psychiatric this patient is able to make decisions and demonstrates good insight into disease process. Alert and Oriented x 3. pleasant and cooperative. Notes Upon inspection patient's wound bed actually showed signs of good granulation and epithelization at this point. Fortunately I do not see any evidence of worsening overall and I believe that the patient is making headway here towards closure. Electronic Signature(s) Signed: 08/09/2023 2:02:49 PM By: Robert Derry PA-C Entered By: [name] on 08/09/2023 14:02:49 -------------------------------------------------------------------------------- Physician Orders Details Patient Name: Date of Service: [name], Robert [name]. 08/09/2023 1:15 PM Medical Record Number: [id number] Patient Account Number: [id number] Date of Birth/Sex: Treating RN: July 27, 1966 (57 y.o. [name] Primary Care Provider: Eleanora [name] Other Clinician: Referring Provider: Treating Provider/Extender: [name] in [name]: [address] [place] [name] ([id number]) [id number] Page 4 of 7 The following information was scribed by: [name] The information was scribed for: [name] Verbal / Phone Orders: No Diagnosis Coding ICD-10  Coding Code Description L89.610 Pressure ulcer of right heel, unstageable I10 Essential (primary) hypertension Follow-up Appointments Return Appointment in 1 week. Nurse Visit as needed Bathing/ Shower/ Hygiene May shower; gently cleanse wound with antibacterial soap, rinse and pat dry prior to dressing wounds Anesthetic (Use 'Patient Medications' Section for Anesthetic Order Entry) Lidocaine applied to wound bed Wound [name] Wound #1 - Calcaneus Wound Laterality: Right Cleanser: Soap and Water 3 x Per Week/30 Days Discharge Instructions: Use as directed. Prim Dressing: Hydrofera Blue Ready Transfer Foam, 2.5x2.5 (in/in) (Generic) 3 x Per Week/30 Days ary Discharge Instructions: Apply Hydrofera Blue Ready to wound bed as directed Secondary Dressing: ABD Pad 5x9 (in/in) (Generic) 3 x Per Week/30 Days Discharge Instructions: Cover with ABD pad Secured With: Kerlix Roll Sterile or Non-Sterile 6-ply 4.5x4 (yd/yd) (Generic) 3 x Per Week/30 Days Discharge Instructions: Apply Kerlix as directed Electronic Signature(s) Signed: 08/10/2023 4:00:09 PM By: Robert Derry PA-C Signed: 08/13/2023 12:46:12 PM By: Robert Aver MSN RN CNS WTA Entered By: [name] on 08/09/2023 13:52:02 -------------------------------------------------------------------------------- Problem List Details Patient Name: Date of Service: [name], Robert [name]. 08/09/2023 1:15 PM Medical Record Number: [id number] Patient Account Number: [id number] Date of Birth/Sex: Treating RN: May 20, 1966 (57 y.o. [name] Primary Care Provider: Eleanora [name] Other Clinician: Referring Provider: Treating Provider/Extender: [name] in [name]: 10 Active Problems ICD-10 Encounter Code Description Active Date MDM Diagnosis L89.610 Pressure ulcer of right heel, unstageable 05/31/2023 No Yes I10 Essential (primary) hypertension 05/31/2023 No Yes [name], HEINY ([id number])  [id number] Page 5 of 7 Inactive Problems Resolved Problems Electronic Signature(s) Signed: 08/09/2023 1:31:50 PM By: Robert Derry PA-C Entered By: [name] on 08/09/2023 13:31:50 -------------------------------------------------------------------------------- Progress Note Details Patient Name: Date of Service: [name], Robert [place] [name]. 08/09/2023 1:15 PM Medical Record Number: [id number] Patient Account Number: [id number] Date of Birth/Sex: Treating RN: 1966-02-18 (57 y.o. [name] Primary Care Provider: Eleanora [name] Other Clinician: Referring Provider: Treating  Provider/Extender: [name]: 10 Subjective Chief Complaint Information obtained from Patient Right heel ulcer History of Present Illness (HPI) 05-31-2023 patient presents today for initial evaluation here in the clinic concerning issues that he is having with an eschar over the right heel. Unfortunately this came on seemingly fairly suddenly when he was at work. He does work around [employer] but states he was not doing anything directly with them at that point. Again I am unsure exactly what went on here and to what degree the chemicals could have played a role in this but this almost looks to me more like damage secondary to a chemical burn versus a strict pressure ulcer although I am unsure to be certain. The issue here is simply that he has 3 areas that are somewhat separated by some skin in between although to some degree connected as well which is very necrotic and eschar covered and to be honest getting any dressing to this is not really going to be of benefit at this point. Fortunately I do not see any signs of active infection at this time which is good news. With that being said I do think that this is gena be difficult to get heal until we get the eschar off of the area. The patient voiced understanding and his wife was present during the  evaluation today as well. He does have hypertension but no other major medical problems. [name] my knowledge this is not a workers comp o case although I am unsure whether or not it should be to be honest. 06-08-2023 upon evaluation today patient appears to be doing well currently in regard to his wound. He has been tolerating the dressing changes without complication. Fortunately there does not appear to be any signs of active infection locally or systemically which is great news. With that being said he still has a pretty much eschar covering he did get the Santyl after a bit of a fight although he only got 2 tubes this may not last a full month but hopefully we will need it the whole month and this will work out just fine for [place] will make do with what we can get. 06-15-2023 upon evaluation today patient's wound is actually starting to soften up as far as the eschar is concerned actually think we may be able to get some of that off today and I discussed that with the patient. He is in agreement with given the a shot to do this. 06-22-2023 upon evaluation today patient appears to be doing much better in regard to his heel actually feel like most of the wounds are actually making significant improvement here which is great news and are very pleased in that regard. I do not see any signs of active infection looking or systemically at this time which is great news and in general I do believe that we are making good headway towards complete closure. 10-7 24 upon evaluation today patient appears to be doing well currently in regard to his wound. Again the heel is showing signs of excellent improvement and actually very pleased with where we stand I do believe that he is doing quite well currently. 07-05-2023 upon evaluation today patient appears to be doing well currently in regard to his wound. He is actually showing signs of improvement which is great news and in general I do believe that we are making headway  towards complete closure which is great news. I think that he is doing well currently  with the Iodoflex. 07-12-2023 upon evaluation patient's wound bed actually showed signs of good granulation epithelization at this point. He is tolerating dressing changes without complication and good news is I feel like they were moving in the right direction here. I do not see any signs of active infection at this time. 07-19-2023 upon evaluation today patient appears to be doing very well in regard to his wound the heel actually showing signs of dramatic improvement at this time. There does not appear to be any signs of active infection which is good news. No fevers, chills, nausea, vomiting, or diarrhea. 08-02-2023 upon evaluation today patient appears to be doing well currently in regard to his wound. He has been tolerating the dressing changes without complication. Fortunately there does not appear to be any signs of active infection locally or systemically which is great news and in general I think that we are moving in the right direction here. There is some biofilm noted on the surface of the wound. [name], [name] ([id number]) [id number] Page 6 of 7 08-09-2023 upon evaluation today patient appears to be doing well currently in regard to his wound. He has been tolerating the dressing changes without complication. Fortunately I do not see any signs of active infection locally or systemically at this time which is great news. No fevers, chills, nausea, vomiting, or diarrhea. Objective Constitutional Well-nourished and well-hydrated in no acute distress. Vitals Time Taken: 1:12 PM, Height: 73 in, Weight: 205 lbs, BMI: 27, Temperature: 98.4 F, Pulse: 70 bpm, Respiratory Rate: 18 breaths/min, Blood Pressure: 138/85 mmHg. Respiratory normal breathing without difficulty. Psychiatric this patient is able to make decisions and demonstrates good insight into disease process. Alert  and Oriented x 3. pleasant and cooperative. General Notes: Upon inspection patient's wound bed actually showed signs of good granulation and epithelization at this point. Fortunately I do not see any evidence of worsening overall and I believe that the patient is making headway here towards closure. Integumentary (Hair, Skin) Wound #1 status is Open. Original cause of wound was Gradually Appeared. The date acquired was: 05/20/2023. The wound has been in [name] 10 [name]. The wound is located on the Right Calcaneus. The wound measures 3.5cm length x 2.5cm width x 0.1cm depth; 6.872cm^2 area and 0.687cm^3 volume. There is Fat Layer (Subcutaneous Tissue) exposed. There is a medium amount of serosanguineous drainage noted. There is no granulation within the wound bed. There is a large (67-100%) amount of necrotic tissue within the wound bed including Eschar. Assessment Active Problems ICD-10 Pressure ulcer of right heel, unstageable Essential (primary) hypertension Procedures Wound #1 Pre-procedure diagnosis of Wound #1 is a Pressure Ulcer located on the Right Calcaneus . There was a Excisional Skin/Subcutaneous Tissue Debridement with a total area of 6.87 sq cm performed by Robert Derry, PA-C. With the following instrument(s): Curette to remove Viable and Non-Viable tissue/material. Material removed includes Subcutaneous Tissue, Slough, Skin: Dermis, and Skin: Epidermis after achieving pain control using Lidocaine 4% [name] opical Solution. No specimens were taken. A time out was conducted at 13:50, prior to the start of the procedure. A Minimum amount of bleeding was controlled with Pressure. The procedure was tolerated well with a pain level of 0 throughout and a pain level of 1 following the procedure. Post Debridement Measurements: 3.5cm length x 2.5cm width x 0.1cm depth; 0.687cm^3 volume. Post debridement Stage noted as Category/Stage IV. Character of Wound/Ulcer Post Debridement is stable. Post  procedure Diagnosis Wound #1: Same as Pre-Procedure Plan Follow-up Appointments: Return Appointment  in 1 week. Nurse Visit as needed Bathing/ Shower/ Hygiene: May shower; gently cleanse wound with antibacterial soap, rinse and pat dry prior to dressing wounds Anesthetic (Use 'Patient Medications' Section for Anesthetic Order Entry): Lidocaine applied to wound bed WOUND #1: - Calcaneus Wound Laterality: Right Cleanser: Soap and Water 3 x Per Week/30 Days Discharge Instructions: Use as directed. Prim Dressing: Hydrofera Blue Ready Transfer Foam, 2.5x2.5 (in/in) (Generic) 3 x Per Week/30 Days ary Discharge Instructions: Apply Hydrofera Blue Ready to wound bed as directed Secondary Dressing: ABD Pad 5x9 (in/in) (Generic) 3 x Per Week/30 Days Discharge Instructions: Cover with ABD pad Secured With: Kerlix Roll Sterile or Non-Sterile 6-ply 4.5x4 (yd/yd) (Generic) 3 x Per Week/30 Days KERMITT, MARCELLO ([id number]) [id number] Page 7 of 7 Discharge Instructions: Apply Kerlix as directed 1. I would recommend that the patient should continue to monitor for any signs of infection or worsening. Based on what I am seeing I feel like there were making good headway here towards getting this completely closed. 2. I am good recommend the patient should continue with the [facility] which I think is doing excellent. We will see patient back for reevaluation in 1 week here in the clinic. If anything worsens or changes patient will contact our office for additional recommendations. Electronic Signature(s) Signed: 08/09/2023 2:03:07 PM By: Robert Derry PA-C Entered By: [name] on 08/09/2023 14:03:07 -------------------------------------------------------------------------------- SuperBill Details Patient Name: Date of Service: [name], Robert [name]. 08/09/2023 Medical Record Number: [id number] Patient Account Number: [id number] Date of Birth/Sex: Treating RN: 05-15-1966 (57  y.o. [name] Primary Care Provider: Eleanora [name] Other Clinician: Referring Provider: Treating Provider/Extender: [name] in [name]: 10 Diagnosis Coding ICD-10 Codes Code Description L89.610 Pressure ulcer of right heel, unstageable I10 Essential (primary) hypertension Facility Procedures : CPT4 Code: [id number] Description: 11042 - DEB SUBQ TISSUE 20 SQ CM/< ICD-10 Diagnosis Description L89.610 Pressure ulcer of right heel, unstageable Modifier: Quantity: 1 Physician Procedures : CPT4 Code Description Modifier 11042 11042 - WC PHYS SUBQ TISS 20 SQ CM ICD-10 Diagnosis Description L89.610 Pressure ulcer of right heel, unstageable Quantity: 1 Electronic Signature(s) Signed: 08/09/2023 2:04:21 PM By: Robert Derry PA-C Entered By: [name] on 08/09/2023 14:04:20

## 2023-08-13 NOTE — Progress Notes (Signed)
Robert, [name] ([id number]) [id number] Page 1 of 8 Visit Report for 08/09/2023 Arrival Information Details Patient Name: Date of Service: Robert Lav [place] T. 08/09/2023 1:15 PM Medical Record Number: [id number] Patient Account Number: [id number] Date of Birth/Sex: Treating RN: 30-Jul-1966 (57 y.o. [name] Primary Care Kholton Coate: Eleanora Neighbor Other Clinician: Referring Hillman Attig: Treating Garik Diamant/Extender: Brandt Loosen in Treatment: 10 Visit Information History Since Last Visit Added or deleted any medications: No Patient Arrived: Ambulatory Any new allergies or adverse reactions: No Arrival Time: 13:10 Has Dressing in Place as Prescribed: Yes Accompanied By: self Pain Present Now: No Transfer Assistance: None Patient Identification Verified: Yes Secondary Verification Process Completed: Yes Patient Requires Transmission-Based Precautions: No Patient Has Alerts: Yes Patient Alerts: Not diabetic Electronic Signature(s) Signed: 08/13/2023 12:46:12 PM By: Midge Aver MSN RN CNS WTA Entered By: Midge Aver on 08/09/2023 13:12:25 -------------------------------------------------------------------------------- Clinic Level of Care Assessment Details Patient Name: Date of Service: Robert Lav [place] T. 08/09/2023 1:15 PM Medical Record Number: [id number] Patient Account Number: [id number] Date of Birth/Sex: Treating RN: [birth year] (57 y.o. [name] Primary Care [name]: Eleanora Neighbor Other Clinician: Referring Adonte Vanriper: Treating Helana Macbride/Extender: Brandt Loosen in Treatment: 10 Clinic Level of Care Assessment Items TOOL 1 Quantity Score []  - 0 Use when EandM and Procedure is performed on INITIAL visit ASSESSMENTS - Nursing Assessment / Reassessment []  - 0 General Physical Exam (combine w/ comprehensive assessment (listed just below) when performed on new pt. evals) []  -  0 Comprehensive Assessment (HX, ROS, Risk Assessments, Wounds Hx, etc.) ASSESSMENTS - Wound and Skin Assessment / Reassessment []  - 0 Dermatologic / Skin Assessment (not related to wound area) ASSESSMENTS - Ostomy and/or Continence Assessment and Care KONA, BARTOK ([id number]) [id number] Page 2 of 8 []  - 0 Incontinence Assessment and Management []  - 0 Ostomy Care Assessment and Management (repouching, etc.) PROCESS - Coordination of Care []  - 0 Simple Patient / Family Education for ongoing care []  - 0 Complex (extensive) Patient / Family Education for ongoing care []  - 0 Staff obtains Chiropractor, Records, T Results / Process Orders est []  - 0 Staff telephones HHA, Nursing Homes / Clarify orders / etc []  - 0 Routine Transfer to another Facility (non-emergent condition) []  - 0 Routine Hospital Admission (non-emergent condition) []  - 0 New Admissions / Manufacturing engineer / Ordering NPWT Apligraf, etc. , []  - 0 Emergency Hospital Admission (emergent condition) PROCESS - Special Needs []  - 0 Pediatric / Minor Patient Management []  - 0 Isolation Patient Management []  - 0 Hearing / Language / Visual special needs []  - 0 Assessment of Community assistance (transportation, D/C planning, etc.) []  - 0 Additional assistance / Altered mentation []  - 0 Support Surface(s) Assessment (bed, cushion, seat, etc.) INTERVENTIONS - Miscellaneous []  - 0 External ear exam []  - 0 Patient Transfer (multiple staff / Nurse, adult / Similar devices) []  - 0 Simple Staple / Suture removal (25 or less) []  - 0 Complex Staple / Suture removal (26 or more) []  - 0 Hypo/Hyperglycemic Management (do not check if billed separately) []  - 0 Ankle / Brachial Index (ABI) - do not check if billed separately Has the patient been seen at the hospital within the last three years: Yes Total Score: 0 Level Of Care: ____ Electronic Signature(s) Signed: 08/13/2023 12:46:12 PM  By: Midge Aver MSN RN CNS WTA Entered By: Midge Aver on 08/09/2023 13:52:47 -------------------------------------------------------------------------------- Encounter Discharge Information Details Patient Name: Date of Service: TA  [name], [name] 08/09/2023 1:15 PM Medical Record Number: [id number] Patient Account Number: [id number] Date of Birth/Sex: Treating RN: Jan 24, 1966 (57 y.o. [name] Primary Care Lazaria Schaben: Eleanora Neighbor Other Clinician: Referring Marcelis Wissner: Treating Caytlyn Evers/Extender: Brandt Loosen in Treatment: 10 Encounter Discharge Information Items Post Procedure Vitals Discharge Condition: Stable Temperature (F): 98.4 BRAYANT, SOBH T ([id number]) [id number] Page 3 of 8 Ambulatory Status: Ambulatory Pulse (bpm): 70 Discharge Destination: Home Respiratory Rate (breaths/min): 18 Transportation: Private Auto Blood Pressure (mmHg): 138/85 Accompanied By: self Schedule Follow-up Appointment: Yes Clinical Summary of Care: Electronic Signature(s) Signed: 08/13/2023 12:46:12 PM By: Midge Aver MSN RN CNS WTA Entered By: Midge Aver on 08/09/2023 13:56:06 -------------------------------------------------------------------------------- Lower Extremity Assessment Details Patient Name: Date of Service: [name], [name] 08/09/2023 1:15 PM Medical Record Number: [id number] Patient Account Number: [id number] Date of Birth/Sex: Treating RN: Aug 29, 1966 (57 y.o. [name] Primary Care Kris No: Eleanora Neighbor Other Clinician: Referring Shalin Vonbargen: Treating Kayson Tasker/Extender: Brandt Loosen in Treatment: 10 Electronic Signature(s) Signed: 08/13/2023 12:46:12 PM By: Midge Aver MSN RN CNS WTA Entered By: Midge Aver on 08/09/2023 13:22:29 -------------------------------------------------------------------------------- Multi Wound Chart Details Patient Name: Date of  Service: [name], [name] 08/09/2023 1:15 PM Medical Record Number: [id number] Patient Account Number: [id number] Date of Birth/Sex: Treating RN: [birth year] (57 y.o. [name] Primary Care Juneau Doughman: Eleanora Neighbor Other Clinician: Referring Azaylea Maves: Treating Jonathan Kirkendoll/Extender: Brandt Loosen in Treatment: 10 Vital Signs Height(in): 73 Pulse(bpm): 70 Weight(lbs): 205 Blood Pressure(mmHg): 138/85 Body Mass Index(BMI): 27 Temperature(F): 98.4 Respiratory Rate(breaths/min): 18 [1:Photos:] [N/A:N/A] Right Calcaneus N/A N/A Wound Location: Gradually Appeared N/A N/A Wounding Event: Pressure Ulcer N/A N/A Primary Etiology: Hypertension N/A N/A Comorbid History: 05/20/2023 N/A N/A Date Acquired: 10 N/A N/A Weeks of Treatment: Open N/A N/A Wound Status: No N/A N/A Wound Recurrence: Yes N/A N/A Clustered Wound: 3.5x2.5x0.1 N/A N/A Measurements L x W x D (cm) 6.872 N/A N/A A (cm) : rea 0.687 N/A N/A Volume (cm) : 80.60% N/A N/A % Reduction in A rea: 90.30% N/A N/A % Reduction in Volume: Category/Stage IV N/A N/A Classification: Medium N/A N/A Exudate A mount: Serosanguineous N/A N/A Exudate Type: red, brown N/A N/A Exudate Color: None Present (0%) N/A N/A Granulation A mount: Large (67-100%) N/A N/A Necrotic A mount: Eschar N/A N/A Necrotic Tissue: Fat Layer (Subcutaneous Tissue): Yes N/A N/A Exposed Structures: Fascia: No Tendon: No Muscle: No Joint: No Bone: No None N/A N/A Epithelialization: Treatment Notes Electronic Signature(s) Signed: 08/13/2023 12:46:12 PM By: Midge Aver MSN RN CNS WTA Entered By: Midge Aver on 08/09/2023 13:22:34 -------------------------------------------------------------------------------- Multi-Disciplinary Care Plan Details Patient Name: Date of Service: [name], [name] 08/09/2023 1:15 PM Medical Record Number: [id number] Patient Account Number: [id number] Date of Birth/Sex:  Treating RN: 08-13-1966 (57 y.o. [name] Primary Care Tamara Monteith: Eleanora Neighbor Other Clinician: Referring Nagee Goates: Treating Bryceson Grape/Extender: Brandt Loosen in Treatment: [address] Inactive Necrotic Tissue FEYNMAN, FARO ([id number]) [id number] Page 5 of 8 Nursing Diagnoses: Impaired tissue integrity related to necrotic/devitalized tissue Knowledge deficit related to management of necrotic/devitalized tissue Goals: Necrotic/devitalized tissue will be minimized in the wound bed Date Initiated: 05/31/2023 Date Inactivated: 06/28/2023 Target Resolution Date: 06/30/2023 Goal Status: Met Patient/caregiver will verbalize understanding of reason and process for debridement of necrotic tissue Date Initiated: 05/31/2023 Target Resolution Date: 09/20/2023 Goal Status: Active Interventions: Assess patient pain level pre-, during and post procedure and prior to discharge Provide education  on necrotic tissue and debridement process Treatment Activities: Apply topical anesthetic as ordered : 05/31/2023 Enzymatic debridement : 05/31/2023 Notes: Wound/Skin Impairment Nursing Diagnoses: Impaired tissue integrity Knowledge deficit related to ulceration/compromised skin integrity Goals: Patient/caregiver will verbalize understanding of skin care regimen Date Initiated: 05/31/2023 Date Inactivated: 06/28/2023 Target Resolution Date: 06/30/2023 Goal Status: Met Ulcer/skin breakdown will have a volume reduction of 30% by week 4 Date Initiated: 05/31/2023 Date Inactivated: 06/28/2023 Target Resolution Date: 06/30/2023 Goal Status: Met Ulcer/skin breakdown will have a volume reduction of 50% by week 8 Date Initiated: 05/31/2023 Date Inactivated: 08/02/2023 Target Resolution Date: 07/31/2023 Goal Status: Met Ulcer/skin breakdown will have a volume reduction of 80% by week 12 Date Initiated: 05/31/2023 Target Resolution Date: 08/30/2023 Goal Status:  Active Ulcer/skin breakdown will heal within 14 weeks Date Initiated: 05/31/2023 Target Resolution Date: 09/06/2023 Goal Status: Active Interventions: Assess patient/caregiver ability to obtain necessary supplies Assess patient/caregiver ability to perform ulcer/skin care regimen upon admission and as needed Assess ulceration(s) every visit Provide education on ulcer and skin care Notes: Electronic Signature(s) Signed: 08/13/2023 12:46:12 PM By: Midge Aver MSN RN CNS WTA Entered By: Midge Aver on 08/09/2023 13:54:22 -------------------------------------------------------------------------------- Pain Assessment Details Patient Name: Date of Service: [name], [name] 08/09/2023 1:15 PM Medical Record Number: [id number] Patient Account Number: [id number] PAVEL, PEPITONE ([id number]) [id number].pdf Page 6 of 8 Date of Birth/Sex: Treating RN: Apr 14, 1966 (57 y.o. [name] Primary Care Esra Frankowski: Other Clinician: Eleanora Neighbor Referring Sadao Weyer: Treating Zavia Pullen/Extender: Brandt Loosen in Treatment: 10 Active Problems Location of Pain Severity and Description of Pain Patient Has Paino No Site Locations Pain Management and Medication Current Pain Management: Electronic Signature(s) Signed: 08/13/2023 12:46:12 PM By: Midge Aver MSN RN CNS WTA Entered By: Midge Aver on 08/09/2023 13:13:03 -------------------------------------------------------------------------------- Patient/Caregiver Education Details Patient Name: Date of Service: TA [facility] 11/18/2024andnbsp1:15 PM Medical Record Number: [id number] Patient Account Number: [id number] Date of Birth/Gender: Treating RN: 1965/09/28 (57 y.o. [name] Primary Care Physician: Eleanora Neighbor Other Clinician: Referring Physician: Treating Physician/Extender: Brandt Loosen in Treatment: 10 Education Assessment Education  Provided To: Patient Education Topics Provided Wound Debridement: Handouts: Wound Debridement Methods: Explain/Verbal Responses: State content correctly Wound/Skin Impairment: Handouts: Caring for Your Ulcer Methods: Explain/Verbal LUIS, SHEARER ([id number]) [id number] Page 7 of 8 Responses: State content correctly Electronic Signature(s) Signed: 08/13/2023 12:46:12 PM By: Midge Aver MSN RN CNS WTA Entered By: Midge Aver on 08/09/2023 13:54:38 -------------------------------------------------------------------------------- Wound Assessment Details Patient Name: Date of Service: [name], [name] 08/09/2023 1:15 PM Medical Record Number: [id number] Patient Account Number: [id number] Date of Birth/Sex: Treating RN: April 14, 1966 (57 y.o. [name] Primary Care Salimah Martinovich: Eleanora Neighbor Other Clinician: Referring Kymoni Monday: Treating Emmalynn Pinkham/Extender: [name] Weeks in Treatment: 10 Wound Status Wound Number: 1 Primary Etiology: Pressure Ulcer Wound Location: Right Calcaneus Wound Status: Open Wounding Event: Gradually Appeared Comorbid History: Hypertension Date Acquired: 05/20/2023 Weeks Of Treatment: 10 Clustered Wound: Yes Photos Wound Measurements Length: (cm) 3.5 Width: (cm) 2.5 Depth: (cm) 0.1 Area: (cm) 6.872 Volume: (cm) 0.687 % Reduction in Area: 80.6% % Reduction in Volume: 90.3% Epithelialization: None Wound Description Classification: Category/Stage IV Exudate Amount: Medium Exudate Type: Serosanguineous Exudate Color: red, brown Foul Odor After Cleansing: No Slough/Fibrino No Wound Bed Granulation Amount: None Present (0%) Exposed Structure Necrotic Amount: Large (67-100%) Fascia Exposed: No Necrotic Quality: Eschar Fat Layer (Subcutaneous Tissue) Exposed: Yes Tendon Exposed: No Muscle Exposed: No Joint Exposed: No  Bone Exposed: No MUHAMAD, GAULD T ([id number])  [id number] Page 8 of 8 Treatment Notes Wound #1 (Calcaneus) Wound Laterality: Right Cleanser Soap and Water Discharge Instruction: Use as directed. Peri-Wound Care Topical Primary Dressing Hydrofera Blue Ready Transfer Foam, 2.5x2.5 (in/in) Discharge Instruction: Apply Hydrofera Blue Ready to wound bed as directed Secondary Dressing ABD Pad 5x9 (in/in) Discharge Instruction: Cover with ABD pad Secured With Kerlix Roll Sterile or Non-Sterile 6-ply 4.5x4 (yd/yd) Discharge Instruction: Apply Kerlix as directed Compression Wrap Compression Stockings Add-Ons Electronic Signature(s) Signed: 08/13/2023 12:46:12 PM By: Midge Aver MSN RN CNS WTA Entered By: Midge Aver on 08/09/2023 13:22:05 -------------------------------------------------------------------------------- Vitals Details Patient Name: Date of Service: [name], [name] 08/09/2023 1:15 PM Medical Record Number: [id number] Patient Account Number: [id number] Date of Birth/Sex: Treating RN: [birth year] (57 y.o. [name] Primary Care Milisa Kimbell: Eleanora Neighbor Other Clinician: Referring Gerrald Basu: Treating Corona Popovich/Extender: Brandt Loosen in Treatment: 10 Vital Signs Time Taken: 13:12 Temperature (F): 98.4 Height (in): 73 Pulse (bpm): 70 Weight (lbs): 205 Respiratory Rate (breaths/min): 18 Body Mass Index (BMI): 27 Blood Pressure (mmHg): 138/85 Reference Range: 80 - 120 mg / dl Electronic Signature(s) Signed: 08/13/2023 12:46:12 PM By: Midge Aver MSN RN CNS WTA Entered By: Midge Aver on 08/09/2023 13:12:57

## 2023-08-16 ENCOUNTER — Encounter: Payer: Managed Care, Other (non HMO) | Admitting: Physician Assistant

## 2023-08-16 DIAGNOSIS — L89610 Pressure ulcer of right heel, unstageable: Secondary | ICD-10-CM | POA: Diagnosis not present

## 2023-08-16 NOTE — Progress Notes (Addendum)
AHAMAD, SUMMAR ([id number]) [id number] Page 1 of 7 Visit Report for 08/16/2023 Chief Complaint Document Details Patient Name: Date of Service: Robert Cleveland [name]. 08/16/2023 1:15 PM Medical Record Number: [id number] Patient Account Number: [id number] Date of Birth/Sex: Treating RN: 07-13-1966 (57 y.o. [name] Primary Care Provider: Eleanora Neighbor Other Clinician: Referring Provider: Treating Provider/Extender: Brandt Loosen in Treatment: 11 Information Obtained from: Patient Chief Complaint Right heel ulcer Electronic Signature(s) Signed: 08/16/2023 1:08:40 PM By: Allen Derry PA-C Entered By: Allen Derry on 08/16/2023 10:08:40 -------------------------------------------------------------------------------- Debridement Details Patient Name: Date of Service: [name], Robert [name]. 08/16/2023 1:15 PM Medical Record Number: [id number] Patient Account Number: [id number] Date of Birth/Sex: Treating RN: 17-Jun-1966 (57 y.o. [name] Primary Care Provider: Eleanora Neighbor Other Clinician: Referring Provider: Treating Provider/Extender: Brandt Loosen in Treatment: 11 Debridement Performed for Assessment: Wound #1 Right Calcaneus Performed By: Physician Allen Derry, PA-C The following information was scribed by: Midge Aver The information was scribed for: Allen Derry Debridement Type: Debridement Level of Consciousness (Pre-procedure): Awake and Alert Pre-procedure Verification/Time Out Yes - 13:30 Taken: Start Time: 13:30 Pain Control: Lidocaine 4% [name] opical Solution Percent of Wound Bed Debrided: 100% [name] Area Debrided (cm): otal 2.71 Tissue and other material debrided: Viable, Non-Viable, Slough, Subcutaneous, Skin: Dermis , Skin: Epidermis, Slough Level: Skin/Subcutaneous Tissue Debridement Description: Excisional Instrument: Curette Bleeding: Moderate Hemostasis Achieved: Silver  Nitrate [name] ([id number]) [id number] Page 2 of 7 Procedural Pain: 0 Post Procedural Pain: 0 Response to Treatment: Procedure was tolerated well Level of Consciousness (Post- Awake and Alert procedure): Post Debridement Measurements of Total Wound Length: (cm) 2.3 Stage: Category/Stage IV Width: (cm) 1.5 Depth: (cm) 0.1 Volume: (cm) 0.271 Character of Wound/Ulcer Post Debridement: Stable Post Procedure Diagnosis Same as Pre-procedure Electronic Signature(s) Signed: 08/16/2023 3:42:23 PM By: Allen Derry PA-C Signed: 08/16/2023 4:30:50 PM By: Midge Aver MSN RN CNS WTA Entered By: Midge Aver on 08/16/2023 10:36:42 -------------------------------------------------------------------------------- HPI Details Patient Name: Date of Service: [name], Robert [name]. 08/16/2023 1:15 PM Medical Record Number: [id number] Patient Account Number: [id number] Date of Birth/Sex: Treating RN: Feb 01, 1966 (56 y.o. [name] Primary Care Provider: Eleanora Neighbor Other Clinician: Referring Provider: Treating Provider/Extender: Brandt Loosen in Treatment: 11 History of Present Illness HPI Description: 05-31-2023 patient presents today for initial evaluation here in the clinic concerning issues that he is having with an eschar over the right heel. Unfortunately this came on seemingly fairly suddenly when he was at work. He does work around [employer] but states he was not doing anything directly with them at that point. Again I am unsure exactly what went on here and to what degree the chemicals could have played a role in this but this almost looks to me more like damage secondary to a chemical burn versus a strict pressure ulcer although I am unsure to be certain. The issue here is simply that he has 3 areas that are somewhat separated by some skin in between although to some degree connected as well which is very necrotic and eschar  covered and to be honest getting any dressing to this is not really going to be of benefit at this point. Fortunately I do not see any signs of active infection at this time which is good news. With that being said I do think that this is gena be difficult to get heal until we get the eschar off of the area. The  patient voiced understanding and his wife was present during the evaluation today as well. He does have hypertension but no other major medical problems. [name] my knowledge this is not a workers o comp case although I am unsure whether or not it should be to be honest. 06-08-2023 upon evaluation today patient appears to be doing well currently in regard to his wound. He has been tolerating the dressing changes without complication. Fortunately there does not appear to be any signs of active infection locally or systemically which is great news. With that being said he still has a pretty much eschar covering he did get the Santyl after a bit of a fight although he only got 2 tubes this may not last a full month but hopefully we will need it the whole month and this will work out just fine for [place] will make do with what we can get. 06-15-2023 upon evaluation today patient's wound is actually starting to soften up as far as the eschar is concerned actually think we may be able to get some of that off today and I discussed that with the patient. He is in agreement with given the a shot to do this. 06-22-2023 upon evaluation today patient appears to be doing much better in regard to his heel actually feel like most of the wounds are actually making significant improvement here which is great news and are very pleased in that regard. I do not see any signs of active infection looking or systemically at this time which is great news and in general I do believe that we are making good headway towards complete closure. 10-7 24 upon evaluation today patient appears to be doing well currently in regard to his  wound. Again the heel is showing signs of excellent improvement and actually very pleased with where we stand I do believe that he is doing quite well currently. 07-05-2023 upon evaluation today patient appears to be doing well currently in regard to his wound. He is actually showing signs of improvement which is great news and in general I do believe that we are making headway towards complete closure which is great news. I think that he is doing well currently with the Iodoflex. 07-12-2023 upon evaluation patient's wound bed actually showed signs of good granulation epithelization at this point. He is tolerating dressing changes without complication and good news is I feel like they were moving in the right direction here. I do not see any signs of active infection at this time. 07-19-2023 upon evaluation today patient appears to be doing very well in regard to his wound the heel actually showing signs of dramatic improvement at this DANTEZ, VERDUCCI [name] ([id number]) [id number] Page 3 of 7 time. There does not appear to be any signs of active infection which is good news. No fevers, chills, nausea, vomiting, or diarrhea. 08-02-2023 upon evaluation today patient appears to be doing well currently in regard to his wound. He has been tolerating the dressing changes without complication. Fortunately there does not appear to be any signs of active infection locally or systemically which is great news and in general I think that we are moving in the right direction here. There is some biofilm noted on the surface of the wound. 08-09-2023 upon evaluation today patient appears to be doing well currently in regard to his wound. He has been tolerating the dressing changes without complication. Fortunately I do not see any signs of active infection locally or systemically at this time which is  great news. No fevers, chills, nausea, vomiting, or diarrhea. 08-16-2023 upon evaluation today  patient appears to be doing well currently in regard to his heel ulcer. He has been tolerating the dressing changes without complication. Fortunately I do not see any signs of active infection locally or systemically which is great news. Electronic Signature(s) Signed: 08/16/2023 1:44:54 PM By: Allen Derry PA-C Entered By: Allen Derry on 08/16/2023 10:44:54 -------------------------------------------------------------------------------- Physical Exam Details Patient Name: Date of Service: Robert Lillette Boxer [name]. 08/16/2023 1:15 PM Medical Record Number: [id number] Patient Account Number: [id number] Date of Birth/Sex: Treating RN: Feb 08, 1966 (57 y.o. [name] Primary Care Provider: Eleanora Neighbor Other Clinician: Referring Provider: Treating Provider/Extender: Brandt Loosen in Treatment: 11 Constitutional Well-nourished and well-hydrated in no acute distress. Respiratory normal breathing without difficulty. Psychiatric this patient is able to make decisions and demonstrates good insight into disease process. Alert and Oriented x 3. pleasant and cooperative. Notes Upon inspection patient's wound bed actually showed signs of good granulation epithelization at this point. Fortunately I do not see any signs of worsening and in general I feel like that we are making really good headway here towards closure which is great news. I did perform debridement to clear away the necrotic debris he tolerated that without complication 1 stick of silver nitrate was used over 1 area where it was bleeding a little bit more significantly. Electronic Signature(s) Signed: 08/16/2023 1:45:18 PM By: Allen Derry PA-C Entered By: Allen Derry on 08/16/2023 10:45:17 -------------------------------------------------------------------------------- Physician Orders Details Patient Name: Date of Service: [name], Robert [name]. 08/16/2023 1:15 PM Medical Record Number: [id number] Patient  Account Number: [id number] Date of Birth/Sex: Treating RN: 04-Aug-1966 (57 y.o. [name], [name], [name] ([id number]) [id number] Page 4 of 7 Primary Care Provider: Eleanora Neighbor Other Clinician: Referring Provider: Treating Provider/Extender: Brandt Loosen in Treatment: 11 Verbal / Phone Orders: No Diagnosis Coding ICD-10 Coding Code Description L89.610 Pressure ulcer of right heel, unstageable I10 Essential (primary) hypertension Follow-up Appointments Return Appointment in 1 week. Nurse Visit as needed Bathing/ Shower/ Hygiene May shower; gently cleanse wound with antibacterial soap, rinse and pat dry prior to dressing wounds Anesthetic (Use 'Patient Medications' Section for Anesthetic Order Entry) Lidocaine applied to wound bed Wound Treatment Wound #1 - Calcaneus Wound Laterality: Right Cleanser: Soap and Water 3 x Per Week/30 Days Discharge Instructions: Use as directed. Prim Dressing: Hydrofera Blue Ready Transfer Foam, 2.5x2.5 (in/in) (Generic) 3 x Per Week/30 Days ary Discharge Instructions: Apply Hydrofera Blue Ready to wound bed as directed Secondary Dressing: ABD Pad 5x9 (in/in) (Generic) 3 x Per Week/30 Days Discharge Instructions: Cover with ABD pad Secured With: Kerlix Roll Sterile or Non-Sterile 6-ply 4.5x4 (yd/yd) (Generic) 3 x Per Week/30 Days Discharge Instructions: Apply Kerlix as directed Electronic Signature(s) Signed: 08/16/2023 3:42:23 PM By: Allen Derry PA-C Signed: 08/16/2023 4:30:50 PM By: Midge Aver MSN RN CNS WTA Entered By: Midge Aver on 08/16/2023 10:36:09 -------------------------------------------------------------------------------- Problem List Details Patient Name: Date of Service: [name], Robert [name]. 08/16/2023 1:15 PM Medical Record Number: [id number] Patient Account Number: [id number] Date of Birth/Sex: Treating RN: [birth year] (57 y.o. [name] Primary Care  Provider: Eleanora Neighbor Other Clinician: Referring Provider: Treating Provider/Extender: Brandt Loosen in Treatment: 11 Active Problems ICD-10 Encounter Code Description Active Date MDM Diagnosis L89.610 Pressure ulcer of right heel, unstageable 05/31/2023 No Yes MACSEN, FEIERTAG [name] ([id number]) [id number] Page 5 of 7 I10 Essential (primary) hypertension 05/31/2023 No  Yes Inactive Problems Resolved Problems Electronic Signature(s) Signed: 08/16/2023 1:08:33 PM By: Allen Derry PA-C Entered By: Allen Derry on 08/16/2023 10:08:33 -------------------------------------------------------------------------------- Progress Note Details Patient Name: Date of Service: [name], Robert [name]. 08/16/2023 1:15 PM Medical Record Number: [id number] Patient Account Number: [id number] Date of Birth/Sex: Treating RN: October 12, 1965 (57 y.o. [name] Primary Care Provider: Eleanora Neighbor Other Clinician: Referring Provider: Treating Provider/Extender: Brandt Loosen in Treatment: 11 Subjective Chief Complaint Information obtained from Patient Right heel ulcer History of Present Illness (HPI) 05-31-2023 patient presents today for initial evaluation here in the clinic concerning issues that he is having with an eschar over the right heel. Unfortunately this came on seemingly fairly suddenly when he was at work. He does work around [employer] but states he was not doing anything directly with them at that point. Again I am unsure exactly what went on here and to what degree the chemicals could have played a role in this but this almost looks to me more like damage secondary to a chemical burn versus a strict pressure ulcer although I am unsure to be certain. The issue here is simply that he has 3 areas that are somewhat separated by some skin in between although to some degree connected as well which is very necrotic and eschar  covered and to be honest getting any dressing to this is not really going to be of benefit at this point. Fortunately I do not see any signs of active infection at this time which is good news. With that being said I do think that this is gena be difficult to get heal until we get the eschar off of the area. The patient voiced understanding and his wife was present during the evaluation today as well. He does have hypertension but no other major medical problems. [name] my knowledge this is not a workers comp o case although I am unsure whether or not it should be to be honest. 06-08-2023 upon evaluation today patient appears to be doing well currently in regard to his wound. He has been tolerating the dressing changes without complication. Fortunately there does not appear to be any signs of active infection locally or systemically which is great news. With that being said he still has a pretty much eschar covering he did get the Santyl after a bit of a fight although he only got 2 tubes this may not last a full month but hopefully we will need it the whole month and this will work out just fine for [place] will make do with what we can get. 06-15-2023 upon evaluation today patient's wound is actually starting to soften up as far as the eschar is concerned actually think we may be able to get some of that off today and I discussed that with the patient. He is in agreement with given the a shot to do this. 06-22-2023 upon evaluation today patient appears to be doing much better in regard to his heel actually feel like most of the wounds are actually making significant improvement here which is great news and are very pleased in that regard. I do not see any signs of active infection looking or systemically at this time which is great news and in general I do believe that we are making good headway towards complete closure. 10-7 24 upon evaluation today patient appears to be doing well currently in regard to his  wound. Again the heel is showing signs of excellent improvement and actually  very pleased with where we stand I do believe that he is doing quite well currently. 07-05-2023 upon evaluation today patient appears to be doing well currently in regard to his wound. He is actually showing signs of improvement which is great news and in general I do believe that we are making headway towards complete closure which is great news. I think that he is doing well currently with the Iodoflex. 07-12-2023 upon evaluation patient's wound bed actually showed signs of good granulation epithelization at this point. He is tolerating dressing changes without complication and good news is I feel like they were moving in the right direction here. I do not see any signs of active infection at this time. 07-19-2023 upon evaluation today patient appears to be doing very well in regard to his wound the heel actually showing signs of dramatic improvement at this time. There does not appear to be any signs of active infection which is good news. No fevers, chills, nausea, vomiting, or diarrhea. 08-02-2023 upon evaluation today patient appears to be doing well currently in regard to his wound. He has been tolerating the dressing changes without complication. Fortunately there does not appear to be any signs of active infection locally or systemically which is great news and in general I think that we are moving in the right direction here. There is some biofilm noted on the surface of the wound. [name], [name] ([id number]) [id number] Page 6 of 7 08-09-2023 upon evaluation today patient appears to be doing well currently in regard to his wound. He has been tolerating the dressing changes without complication. Fortunately I do not see any signs of active infection locally or systemically at this time which is great news. No fevers, chills, nausea, vomiting, or diarrhea. 08-16-2023 upon evaluation today  patient appears to be doing well currently in regard to his heel ulcer. He has been tolerating the dressing changes without complication. Fortunately I do not see any signs of active infection locally or systemically which is great news. Objective Constitutional Well-nourished and well-hydrated in no acute distress. Vitals Time Taken: 12:55 PM, Height: 73 in, Weight: 205 lbs, BMI: 27, Temperature: 97.8 F, Pulse: 67 bpm, Respiratory Rate: 18 breaths/min, Blood Pressure: 144/91 mmHg. Respiratory normal breathing without difficulty. Psychiatric this patient is able to make decisions and demonstrates good insight into disease process. Alert and Oriented x 3. pleasant and cooperative. General Notes: Upon inspection patient's wound bed actually showed signs of good granulation epithelization at this point. Fortunately I do not see any signs of worsening and in general I feel like that we are making really good headway here towards closure which is great news. I did perform debridement to clear away the necrotic debris he tolerated that without complication 1 stick of silver nitrate was used over 1 area where it was bleeding a little bit more significantly. Integumentary (Hair, Skin) Wound #1 status is Open. Original cause of wound was Gradually Appeared. The date acquired was: 05/20/2023. The wound has been in treatment 11 weeks. The wound is located on the Right Calcaneus. The wound measures 2.3cm length x 1.5cm width x 0.1cm depth; 2.71cm^2 area and 0.271cm^3 volume. There is Fat Layer (Subcutaneous Tissue) exposed. There is a medium amount of serosanguineous drainage noted. There is no granulation within the wound bed. There is a large (67-100%) amount of necrotic tissue within the wound bed including Eschar. Assessment Active Problems ICD-10 Pressure ulcer of right heel, unstageable Essential (primary) hypertension Procedures Wound #1 Pre-procedure diagnosis of Wound #  1 is a Pressure Ulcer  located on the Right Calcaneus . There was a Excisional Skin/Subcutaneous Tissue Debridement with a total area of 2.71 sq cm performed by Allen Derry, PA-C. With the following instrument(s): Curette to remove Viable and Non-Viable tissue/material. Material removed includes Subcutaneous Tissue, Slough, Skin: Dermis, and Skin: Epidermis after achieving pain control using Lidocaine 4% [name] opical Solution. No specimens were taken. A time out was conducted at 13:30, prior to the start of the procedure. A Moderate amount of bleeding was controlled with Silver Nitrate. The procedure was tolerated well with a pain level of 0 throughout and a pain level of 0 following the procedure. Post Debridement Measurements: 2.3cm length x 1.5cm width x 0.1cm depth; 0.271cm^3 volume. Post debridement Stage noted as Category/Stage IV. Character of Wound/Ulcer Post Debridement is stable. Post procedure Diagnosis Wound #1: Same as Pre-Procedure Plan Follow-up Appointments: Return Appointment in 1 week. Nurse Visit as needed Bathing/ Shower/ Hygiene: May shower; gently cleanse wound with antibacterial soap, rinse and pat dry prior to dressing wounds Anesthetic (Use 'Patient Medications' Section for Anesthetic Order Entry): Lidocaine applied to wound bed WOUND #1: - Calcaneus Wound Laterality: Right Cleanser: Soap and Water 3 x Per Week/30 Days Discharge Instructions: Use as directed. BRYAR, CONWAY ([id number]) [id number] Page 7 of 7 Prim Dressing: Hydrofera Blue Ready Transfer Foam, 2.5x2.5 (in/in) (Generic) 3 x Per Week/30 Days ary Discharge Instructions: Apply Hydrofera Blue Ready to wound bed as directed Secondary Dressing: ABD Pad 5x9 (in/in) (Generic) 3 x Per Week/30 Days Discharge Instructions: Cover with ABD pad Secured With: Kerlix Roll Sterile or Non-Sterile 6-ply 4.5x4 (yd/yd) (Generic) 3 x Per Week/30 Days Discharge Instructions: Apply Kerlix as directed 1. I would recommend  based on what we see that we have the patient going continue to monitor for any signs of infection or worsening. 2 I am would recommend as well the patient should continue to utilize the wound care measures as before and specifically I think that the [facility] is doing an awesome job. 3. We will continue with ABD pad and roll gauze to secure in place. 4. I did give him a work note to go back to work and I think light duty sedentary work only would be perfect. We will see patient back for reevaluation in 1 week here in the clinic. If anything worsens or changes patient will contact our office for additional recommendations. Electronic Signature(s) Signed: 08/16/2023 1:46:02 PM By: Allen Derry PA-C Entered By: Allen Derry on 08/16/2023 10:46:02 -------------------------------------------------------------------------------- SuperBill Details Patient Name: Date of Service: [name], Robert [name]. 08/16/2023 Medical Record Number: [id number] Patient Account Number: [id number] Date of Birth/Sex: Treating RN: 08/18/1966 (57 y.o. [name] Primary Care Provider: Eleanora Neighbor Other Clinician: Referring Provider: Treating Provider/Extender: Brandt Loosen in Treatment: 11 Diagnosis Coding ICD-10 Codes Code Description L89.610 Pressure ulcer of right heel, unstageable I10 Essential (primary) hypertension Facility Procedures : CPT4 Code: [id number] Description: 11042 - DEB SUBQ TISSUE 20 SQ CM/< ICD-10 Diagnosis Description L89.610 Pressure ulcer of right heel, unstageable Modifier: Quantity: 1 Physician Procedures : CPT4 Code Description Modifier 11042 11042 - WC PHYS SUBQ TISS 20 SQ CM ICD-10 Diagnosis Description L89.610 Pressure ulcer of right heel, unstageable Quantity: 1 Electronic Signature(s) Signed: 08/16/2023 1:47:32 PM By: Allen Derry PA-C Entered By: Allen Derry on 08/16/2023 10:47:32

## 2023-08-16 NOTE — Progress Notes (Signed)
KEVAL, WOLSKE ([id number]) [id number] Page 1 of 7 Visit Report for 08/16/2023 Arrival Information Details Patient Name: Date of Service: Robert Lav [place] T. 08/16/2023 1:15 PM Medical Record Number: [id number] Patient Account Number: [id number] Date of Birth/Sex: Treating RN: June 19, 1966 (57 y.o. [name] Primary Care Allesandra Huebsch: Eleanora Neighbor Other Clinician: Referring Tequisha Maahs: Treating Calley Drenning/Extender: Brandt Loosen in Treatment: 11 Visit Information History Since Last Visit Added or deleted any medications: No Patient Arrived: Ambulatory Any new allergies or adverse reactions: No Arrival Time: 12:51 Has Dressing in Place as Prescribed: Yes Accompanied By: wife Pain Present Now: No Transfer Assistance: None Patient Identification Verified: Yes Secondary Verification Process Completed: Yes Patient Requires Transmission-Based Precautions: No Patient Has Alerts: Yes Patient Alerts: Not diabetic Electronic Signature(s) Signed: 08/16/2023 4:30:50 PM By: Midge Aver MSN RN CNS WTA Entered By: Midge Aver on 08/16/2023 09:55:00 -------------------------------------------------------------------------------- Encounter Discharge Information Details Patient Name: Date of Service: [name], [name] 08/16/2023 1:15 PM Medical Record Number: [id number] Patient Account Number: [id number] Date of Birth/Sex: Treating RN: [birth year] (57 y.o. [name] Primary Care Ewart Carrera: Eleanora Neighbor Other Clinician: Referring Tytus Strahle: Treating Nakyla Bracco/Extender: Brandt Loosen in Treatment: 11 Encounter Discharge Information Items Post Procedure Vitals Discharge Condition: Stable Temperature (F): 97.8 Ambulatory Status: Ambulatory Pulse (bpm): 67 Discharge Destination: Home Respiratory Rate (breaths/min): 18 Transportation: Private Auto Blood Pressure (mmHg): 144/91 Accompanied By:  wife Schedule Follow-up Appointment: Yes Clinical Summary of Care: Electronic Signature(s) Signed: 08/16/2023 1:53:28 PM By: Midge Aver MSN RN CNS Zannie Kehr, Won 08/16/2023 1:53:28 PM By: Midge Aver MSN RN CNS WTA Signed: T ([id number]) [id number] Page 2 of 7 Entered By: Midge Aver on 08/16/2023 10:53:28 -------------------------------------------------------------------------------- Lower Extremity Assessment Details Patient Name: Date of Service: Robert [place] T. 08/16/2023 1:15 PM Medical Record Number: [id number] Patient Account Number: [id number] Date of Birth/Sex: Treating RN: Nov 09, 1965 (57 y.o. [name] Primary Care Yarenis Cerino: Eleanora Neighbor Other Clinician: Referring Babette Stum: Treating Julea Hutto/Extender: Brandt Loosen in Treatment: 11 Electronic Signature(s) Signed: 08/16/2023 4:30:50 PM By: Midge Aver MSN RN CNS WTA Entered By: Midge Aver on 08/16/2023 10:04:04 -------------------------------------------------------------------------------- Multi Wound Chart Details Patient Name: Date of Service: [name], [name] 08/16/2023 1:15 PM Medical Record Number: [id number] Patient Account Number: [id number] Date of Birth/Sex: Treating RN: [birth year] (57 y.o. [name] Primary Care Muranda Coye: Eleanora Neighbor Other Clinician: Referring Becci Batty: Treating Bennette Hasty/Extender: Brandt Loosen in Treatment: 11 Vital Signs Height(in): 73 Pulse(bpm): 67 Weight(lbs): 205 Blood Pressure(mmHg): 144/91 Body Mass Index(BMI): 27 Temperature(F): 97.8 Respiratory Rate(breaths/min): 18 [1:Photos:] [N/A:N/A] Right Calcaneus N/A N/A Wound Location: Gradually Appeared N/A N/A Wounding Event: Pressure Ulcer N/A N/A Primary Etiology: Hypertension N/A N/A Comorbid History: 05/20/2023 N/A N/A Date Acquired: TRYPP, SHEELEY ([id number]) [id number] Page 3 of  7 11 N/A N/A Weeks of Treatment: Open N/A N/A Wound Status: No N/A N/A Wound Recurrence: Yes N/A N/A Clustered Wound: 2.3x1.5x0.1 N/A N/A Measurements L x W x D (cm) 2.71 N/A N/A A (cm) : rea 0.271 N/A N/A Volume (cm) : 92.30% N/A N/A % Reduction in A rea: 96.20% N/A N/A % Reduction in Volume: Category/Stage IV N/A N/A Classification: Medium N/A N/A Exudate A mount: Serosanguineous N/A N/A Exudate Type: red, brown N/A N/A Exudate Color: None Present (0%) N/A N/A Granulation A mount: Large (67-100%) N/A N/A Necrotic A mount: Eschar N/A N/A Necrotic Tissue: Fat Layer (Subcutaneous Tissue): Yes N/A N/A Exposed Structures: Fascia: No  Tendon: No Muscle: No Joint: No Bone: No None N/A N/A Epithelialization: Treatment Notes Electronic Signature(s) Signed: 08/16/2023 4:30:50 PM By: Midge Aver MSN RN CNS WTA Entered By: Midge Aver on 08/16/2023 10:04:31 -------------------------------------------------------------------------------- Multi-Disciplinary Care Plan Details Patient Name: Date of Service: [name], [name] 08/16/2023 1:15 PM Medical Record Number: [id number] Patient Account Number: [id number] Date of Birth/Sex: Treating RN: [birth year] (57 y.o. [name] Primary Care Letasha Kershaw: Eleanora Neighbor Other Clinician: Referring Adamariz Gillott: Treating Nabeeha Badertscher/Extender: Brandt Loosen in Treatment: 11 Active Inactive Necrotic Tissue Nursing Diagnoses: Impaired tissue integrity related to necrotic/devitalized tissue Knowledge deficit related to management of necrotic/devitalized tissue Goals: Necrotic/devitalized tissue will be minimized in the wound bed Date Initiated: 05/31/2023 Date Inactivated: 06/28/2023 Target Resolution Date: 06/30/2023 Goal Status: Met Patient/caregiver will verbalize understanding of reason and process for debridement of necrotic tissue Date Initiated: 05/31/2023 Target Resolution Date: 09/20/2023 Goal  Status: Active Interventions: Assess patient pain level pre-, during and post procedure and prior to discharge Provide education on necrotic tissue and debridement process Treatment Activities: Apply topical anesthetic as ordered : 05/31/2023 [name] ([id number]) [id number] Page 4 of 7 Enzymatic debridement : 05/31/2023 Notes: Wound/Skin Impairment Nursing Diagnoses: Impaired tissue integrity Knowledge deficit related to ulceration/compromised skin integrity Goals: Patient/caregiver will verbalize understanding of skin care regimen Date Initiated: 05/31/2023 Date Inactivated: 06/28/2023 Target Resolution Date: 06/30/2023 Goal Status: Met Ulcer/skin breakdown will have a volume reduction of 30% by week 4 Date Initiated: 05/31/2023 Date Inactivated: 06/28/2023 Target Resolution Date: 06/30/2023 Goal Status: Met Ulcer/skin breakdown will have a volume reduction of 50% by week 8 Date Initiated: 05/31/2023 Date Inactivated: 08/02/2023 Target Resolution Date: 07/31/2023 Goal Status: Met Ulcer/skin breakdown will have a volume reduction of 80% by week 12 Date Initiated: 05/31/2023 Target Resolution Date: 08/30/2023 Goal Status: Active Ulcer/skin breakdown will heal within 14 weeks Date Initiated: 05/31/2023 Target Resolution Date: 09/06/2023 Goal Status: Active Interventions: Assess patient/caregiver ability to obtain necessary supplies Assess patient/caregiver ability to perform ulcer/skin care regimen upon admission and as needed Assess ulceration(s) every visit Provide education on ulcer and skin care Notes: Electronic Signature(s) Signed: 08/16/2023 4:30:50 PM By: Midge Aver MSN RN CNS WTA Entered By: Midge Aver on 08/16/2023 10:37:16 -------------------------------------------------------------------------------- Pain Assessment Details Patient Name: Date of Service: [name], [name] 08/16/2023 1:15 PM Medical Record Number: [id number] Patient  Account Number: [id number] Date of Birth/Sex: Treating RN: 19-Dec-1965 (57 y.o. [name] Primary Care Jovoni Borkenhagen: Eleanora Neighbor Other Clinician: Referring Keziyah Kneale: Treating Nickie Deren/Extender: Brandt Loosen in Treatment: 11 Active Problems Location of Pain Severity and Description of Pain Patient Has Paino No Site Locations [place], [place] T ([id number]) [id number] Page 5 of 7 Pain Management and Medication Current Pain Management: Electronic Signature(s) Signed: 08/16/2023 4:30:50 PM By: Midge Aver MSN RN CNS WTA Entered By: Midge Aver on 08/16/2023 09:58:34 -------------------------------------------------------------------------------- Patient/Caregiver Education Details Patient Name: Date of Service: [name], [name] 11/25/2024andnbsp1:15 PM Medical Record Number: [id number] Patient Account Number: [id number] Date of Birth/Gender: Treating RN: 02/23/1966 (57 y.o. [name] Primary Care Physician: Eleanora Neighbor Other Clinician: Referring Physician: Treating Physician/Extender: Brandt Loosen in Treatment: 11 Education Assessment Education Provided To: Patient Education Topics Provided Wound/Skin Impairment: Handouts: Caring for Your Ulcer Methods: Explain/Verbal Responses: State content correctly Electronic Signature(s) Signed: 08/16/2023 4:30:50 PM By: Midge Aver MSN RN CNS WTA Entered By: Midge Aver on 08/16/2023 10:37:27 [name] ([id number]) [id number] Page 6 of 7 -------------------------------------------------------------------------------- Wound Assessment  Details Patient Name: Date of Service: Robert [place] T. 08/16/2023 1:15 PM Medical Record Number: [id number] Patient Account Number: [id number] Date of Birth/Sex: Treating RN: [birth year] (57 y.o. [name] Primary Care Donyel Castagnola: Eleanora Neighbor Other  Clinician: Referring Mitchelle Goerner: Treating Star Cheese/Extender: Brandt Loosen in Treatment: 11 Wound Status Wound Number: 1 Primary Etiology: Pressure Ulcer Wound Location: Right Calcaneus Wound Status: Open Wounding Event: Gradually Appeared Comorbid History: Hypertension Date Acquired: 05/20/2023 Weeks Of Treatment: 11 Clustered Wound: Yes Photos Wound Measurements Length: (cm) 2.3 Width: (cm) 1.5 Depth: (cm) 0.1 Area: (cm) 2.71 Volume: (cm) 0.27 % Reduction in Area: 92.3% % Reduction in Volume: 96.2% Epithelialization: None 1 Wound Description Classification: Category/Stage IV Exudate Amount: Medium Exudate Type: Serosanguineous Exudate Color: red, brown Foul Odor After Cleansing: No Slough/Fibrino No Wound Bed Granulation Amount: None Present (0%) Exposed Structure Necrotic Amount: Large (67-100%) Fascia Exposed: No Necrotic Quality: Eschar Fat Layer (Subcutaneous Tissue) Exposed: Yes Tendon Exposed: No Muscle Exposed: No Joint Exposed: No Bone Exposed: No Treatment Notes Wound #1 (Calcaneus) Wound Laterality: Right Cleanser Soap and Water Discharge Instruction: Use as directed. Peri-Wound Care NEPHI, KINZEL T ([id number]) [id number] Page 7 of 7 Topical Primary Dressing Hydrofera Blue Ready Transfer Foam, 2.5x2.5 (in/in) Discharge Instruction: Apply Hydrofera Blue Ready to wound bed as directed Secondary Dressing ABD Pad 5x9 (in/in) Discharge Instruction: Cover with ABD pad Secured With Kerlix Roll Sterile or Non-Sterile 6-ply 4.5x4 (yd/yd) Discharge Instruction: Apply Kerlix as directed Compression Wrap Compression Stockings Add-Ons Electronic Signature(s) Signed: 08/16/2023 4:30:50 PM By: Midge Aver MSN RN CNS WTA Entered By: Midge Aver on 08/16/2023 10:03:52 -------------------------------------------------------------------------------- Vitals Details Patient Name: Date of Service: [name],  [name] 08/16/2023 1:15 PM Medical Record Number: [id number] Patient Account Number: [id number] Date of Birth/Sex: Treating RN: 22-Mar-1966 (57 y.o. [name] Primary Care Coraleigh Sheeran: Eleanora Neighbor Other Clinician: Referring Talayia Hjort: Treating Mihika Surrette/Extender: Brandt Loosen in Treatment: 11 Vital Signs Time Taken: 12:55 Temperature (F): 97.8 Height (in): 73 Pulse (bpm): 67 Weight (lbs): 205 Respiratory Rate (breaths/min): 18 Body Mass Index (BMI): 27 Blood Pressure (mmHg): 144/91 Reference Range: 80 - 120 mg / dl Electronic Signature(s) Signed: 08/16/2023 4:30:50 PM By: Midge Aver MSN RN CNS WTA Entered By: Midge Aver on 08/16/2023 09:57:37

## 2023-08-30 ENCOUNTER — Encounter: Payer: Managed Care, Other (non HMO) | Attending: Physician Assistant | Admitting: Physician Assistant

## 2023-08-30 DIAGNOSIS — I10 Essential (primary) hypertension: Secondary | ICD-10-CM | POA: Diagnosis not present

## 2023-08-30 DIAGNOSIS — L89610 Pressure ulcer of right heel, unstageable: Secondary | ICD-10-CM | POA: Insufficient documentation

## 2023-08-30 NOTE — Progress Notes (Signed)
KEOLA, KOKER ([id number]) [id number] Page 1 of 8 Visit Report for 08/30/2023 Chief Complaint Document Details Patient Name: Date of Service: [name] 08/30/2023 8:30 A M Medical Record Number: [id number] Patient Account Number: [id number] Date of Birth/Sex: Treating RN: 1966-05-15 (57 y.o. [name] Primary Care Provider: Eleanora Neighbor Other Clinician: Referring Provider: Treating Provider/Extender: Brandt Loosen in Treatment: 13 Information Obtained from: Patient Chief Complaint Right heel ulcer Electronic Signature(s) Signed: 08/30/2023 8:35:13 AM By: Allen Derry PA-C Entered By: Allen Derry on 08/30/2023 08:35:13 -------------------------------------------------------------------------------- Debridement Details Patient Name: Date of Service: [name], [name] 08/30/2023 8:30 A M Medical Record Number: [id number] Patient Account Number: [id number] Date of Birth/Sex: Treating RN: [birth year] (57 y.o. [name] Primary Care Provider: Eleanora Neighbor Other Clinician: Referring Provider: Treating Provider/Extender: Brandt Loosen in Treatment: 13 Debridement Performed for Assessment: Wound #1 Right Calcaneus Performed By: Physician Allen Derry, PA-C The following information was scribed by: Midge Aver The information was scribed for: Allen Derry Debridement Type: Debridement Level of Consciousness (Pre-procedure): Awake and Alert Pre-procedure Verification/Time Out Yes - 09:09 Taken: Start Time: 09:09 Pain Control: Lidocaine 2% T opical Liquid Percent of Wound Bed Debrided: 100% T Area Debrided (cm): otal 1.65 Tissue and other material debrided: Viable, Non-Viable, Callus, Slough, Subcutaneous, Slough Level: Skin/Subcutaneous Tissue Debridement Description: Excisional Instrument: Curette Bleeding: Minimum Hemostasis Achieved: Pressure GURBIR, MENDER T ([id number])  [id number] Page 2 of 8 Procedural Pain: 0 Post Procedural Pain: 0 Response to Treatment: Procedure was tolerated well Level of Consciousness (Post- Awake and Alert procedure): Post Debridement Measurements of Total Wound Length: (cm) 1.5 Stage: Category/Stage IV Width: (cm) 1.4 Depth: (cm) 0.1 Volume: (cm) 0.165 Character of Wound/Ulcer Post Debridement: Stable Post Procedure Diagnosis Same as Pre-procedure Electronic Signature(s) Signed: 08/30/2023 4:09:48 PM By: Demetria Pore Signed: 08/31/2023 4:50:41 PM By: Midge Aver MSN RN CNS WTA Signed: 09/01/2023 7:54:12 PM By: Allen Derry PA-C Entered By: Midge Aver on 08/30/2023 09:10:35 -------------------------------------------------------------------------------- HPI Details Patient Name: Date of Service: [name], [name] 08/30/2023 8:30 A M Medical Record Number: [id number] Patient Account Number: [id number] Date of Birth/Sex: Treating RN: [birth year] (57 y.o. [name] Primary Care Provider: Eleanora Neighbor Other Clinician: Referring Provider: Treating Provider/Extender: Brandt Loosen in Treatment: 13 History of Present Illness HPI Description: 05-31-2023 patient presents today for initial evaluation here in the clinic concerning issues that he is having with an eschar over the right heel. Unfortunately this came on seemingly fairly suddenly when he was at work. He does work around [employer] but states he was not doing anything directly with them at that point. Again I am unsure exactly what went on here and to what degree the chemicals could have played a role in this but this almost looks to me more like damage secondary to a chemical burn versus a strict pressure ulcer although I am unsure to be certain. The issue here is simply that he has 3 areas that are somewhat separated by some skin in between although to some degree connected as well which is very necrotic and  eschar covered and to be honest getting any dressing to this is not really going to be of benefit at this point. Fortunately I do not see any signs of active infection at this time which is good news. With that being said I do think that this is gena be difficult to get heal until we get the eschar  off of the area. The patient voiced understanding and his wife was present during the evaluation today as well. He does have hypertension but no other major medical problems. T my knowledge this is not a workers o comp case although I am unsure whether or not it should be to be honest. 06-08-2023 upon evaluation today patient appears to be doing well currently in regard to his wound. He has been tolerating the dressing changes without complication. Fortunately there does not appear to be any signs of active infection locally or systemically which is great news. With that being said he still has a pretty much eschar covering he did get the Santyl after a bit of a fight although he only got 2 tubes this may not last a full month but hopefully we will need it the whole month and this will work out just fine for [place] will make do with what we can get. 06-15-2023 upon evaluation today patient's wound is actually starting to soften up as far as the eschar is concerned actually think we may be able to get some of that off today and I discussed that with the patient. He is in agreement with given the a shot to do this. 06-22-2023 upon evaluation today patient appears to be doing much better in regard to his heel actually feel like most of the wounds are actually making significant improvement here which is great news and are very pleased in that regard. I do not see any signs of active infection looking or systemically at this time which is great news and in general I do believe that we are making good headway towards complete closure. 10-7 24 upon evaluation today patient appears to be doing well currently in regard to  his wound. Again the heel is showing signs of excellent improvement and actually very pleased with where we stand I do believe that he is doing quite well currently. 07-05-2023 upon evaluation today patient appears to be doing well currently in regard to his wound. He is actually showing signs of improvement which is great news and in general I do believe that we are making headway towards complete closure which is great news. I think that he is doing well currently with the Iodoflex. 07-12-2023 upon evaluation patient's wound bed actually showed signs of good granulation epithelization at this point. He is tolerating dressing changes without complication and good news is I feel like they were moving in the right direction here. I do not see any signs of active infection at this time. VIRGEL, ARPINO ([id number]) [id number] Page 3 of 8 07-19-2023 upon evaluation today patient appears to be doing very well in regard to his wound the heel actually showing signs of dramatic improvement at this time. There does not appear to be any signs of active infection which is good news. No fevers, chills, nausea, vomiting, or diarrhea. 08-02-2023 upon evaluation today patient appears to be doing well currently in regard to his wound. He has been tolerating the dressing changes without complication. Fortunately there does not appear to be any signs of active infection locally or systemically which is great news and in general I think that we are moving in the right direction here. There is some biofilm noted on the surface of the wound. 08-09-2023 upon evaluation today patient appears to be doing well currently in regard to his wound. He has been tolerating the dressing changes without complication. Fortunately I do not see any signs of active infection locally or systemically  at this time which is great news. No fevers, chills, nausea, vomiting, or diarrhea. 08-16-2023 upon evaluation  today patient appears to be doing well currently in regard to his heel ulcer. He has been tolerating the dressing changes without complication. Fortunately I do not see any signs of active infection locally or systemically which is great news. 08-30-2023 upon evaluation today patient appears to be doing excellent currently in regard to his heel. He has been tolerating the dressing changes without complication. With that being said I think his heel is making excellent progress here. Electronic Signature(s) Signed: 08/30/2023 5:43:07 PM By: Allen Derry PA-C Entered By: Allen Derry on 08/30/2023 17:43:07 -------------------------------------------------------------------------------- Physical Exam Details Patient Name: Date of Service: TA Lillette Boxer T. 08/30/2023 8:30 A M Medical Record Number: [id number] Patient Account Number: [id number] Date of Birth/Sex: Treating RN: 1965-10-01 (57 y.o. [name] Primary Care Provider: Eleanora Neighbor Other Clinician: Referring Provider: Treating Provider/Extender: Brandt Loosen in Treatment: 13 Constitutional Well-nourished and well-hydrated in no acute distress. Respiratory normal breathing without difficulty. Psychiatric this patient is able to make decisions and demonstrates good insight into disease process. Alert and Oriented x 3. pleasant and cooperative. Notes Upon inspection patient's wound bed actually showed signs of good granulation and epithelization at this point. Fortunately I do not see any signs of infection at this time. I think that postdebridement the wound bed looks much better. Electronic Signature(s) Signed: 08/30/2023 5:43:21 PM By: Allen Derry PA-C Entered By: Allen Derry on 08/30/2023 17:43:21 Physician Orders Details -------------------------------------------------------------------------------- [name] ([id number]) [id number] Page 4 of 8 Patient Name:  Date of Service: [name] 08/30/2023 8:30 A M Medical Record Number: [id number] Patient Account Number: [id number] Date of Birth/Sex: Treating RN: 31-Jul-1966 (57 y.o. [name] Primary Care Provider: Eleanora Neighbor Other Clinician: Referring Provider: Treating Provider/Extender: Brandt Loosen in Treatment: 13 The following information was scribed by: Midge Aver The information was scribed for: Allen Derry Verbal / Phone Orders: No Diagnosis Coding ICD-10 Coding Code Description L89.610 Pressure ulcer of right heel, unstageable I10 Essential (primary) hypertension Follow-up Appointments Return Appointment in 1 week. Nurse Visit as needed Bathing/ Shower/ Hygiene May shower; gently cleanse wound with antibacterial soap, rinse and pat dry prior to dressing wounds Anesthetic (Use 'Patient Medications' Section for Anesthetic Order Entry) Lidocaine applied to wound bed Wound Treatment Wound #1 - Calcaneus Wound Laterality: Right Cleanser: Soap and Water 3 x Per Week/30 Days Discharge Instructions: Use as directed. Prim Dressing: Hydrofera Blue Ready Transfer Foam, 2.5x2.5 (in/in) (Generic) 3 x Per Week/30 Days ary Discharge Instructions: Apply Hydrofera Blue Ready to wound bed as directed Secondary Dressing: ABD Pad 5x9 (in/in) (Generic) 3 x Per Week/30 Days Discharge Instructions: Cover with ABD pad Secured With: Kerlix Roll Sterile or Non-Sterile 6-ply 4.5x4 (yd/yd) (Generic) 3 x Per Week/30 Days Discharge Instructions: Apply Kerlix as directed Electronic Signature(s) Signed: 08/30/2023 4:09:48 PM By: Demetria Pore Signed: 08/31/2023 4:50:41 PM By: Midge Aver MSN RN CNS WTA Signed: 09/01/2023 7:54:12 PM By: Allen Derry PA-C Entered By: Midge Aver on 08/30/2023 09:11:41 -------------------------------------------------------------------------------- Problem List Details Patient Name: Date of Service: [name], [name] 08/30/2023  8:30 A M Medical Record Number: [id number] Patient Account Number: [id number] Date of Birth/Sex: Treating RN: [birth year] (57 y.o. [name] Primary Care Provider: Eleanora Neighbor Other Clinician: Referring Provider: Treating Provider/Extender: Brandt Loosen in Treatment: [address] TOLUWANIMI, MCCALEB T ([id number]) [id number]  Page 5 of 8 ICD-10 Encounter Code Description Active Date MDM Diagnosis L89.610 Pressure ulcer of right heel, unstageable 05/31/2023 No Yes I10 Essential (primary) hypertension 05/31/2023 No Yes Inactive Problems Resolved Problems Electronic Signature(s) Signed: 08/30/2023 8:35:09 AM By: Allen Derry PA-C Entered By: Allen Derry on 08/30/2023 08:35:09 -------------------------------------------------------------------------------- Progress Note Details Patient Name: Date of Service: [name], [name] 08/30/2023 8:30 A M Medical Record Number: [id number] Patient Account Number: [id number] Date of Birth/Sex: Treating RN: [birth year] (57 y.o. [name] Primary Care Provider: Eleanora Neighbor Other Clinician: Referring Provider: Treating Provider/Extender: Brandt Loosen in Treatment: 13 Subjective Chief Complaint Information obtained from Patient Right heel ulcer History of Present Illness (HPI) 05-31-2023 patient presents today for initial evaluation here in the clinic concerning issues that he is having with an eschar over the right heel. Unfortunately this came on seemingly fairly suddenly when he was at work. He does work around [employer] but states he was not doing anything directly with them at that point. Again I am unsure exactly what went on here and to what degree the chemicals could have played a role in this but this almost looks to me more like damage secondary to a chemical burn versus a strict pressure ulcer although I am unsure to be certain. The issue here  is simply that he has 3 areas that are somewhat separated by some skin in between although to some degree connected as well which is very necrotic and eschar covered and to be honest getting any dressing to this is not really going to be of benefit at this point. Fortunately I do not see any signs of active infection at this time which is good news. With that being said I do think that this is gena be difficult to get heal until we get the eschar off of the area. The patient voiced understanding and his wife was present during the evaluation today as well. He does have hypertension but no other major medical problems. T my knowledge this is not a workers comp o case although I am unsure whether or not it should be to be honest. 06-08-2023 upon evaluation today patient appears to be doing well currently in regard to his wound. He has been tolerating the dressing changes without complication. Fortunately there does not appear to be any signs of active infection locally or systemically which is great news. With that being said he still has a pretty much eschar covering he did get the Santyl after a bit of a fight although he only got 2 tubes this may not last a full month but hopefully we will need it the whole month and this will work out just fine for [place] will make do with what we can get. 06-15-2023 upon evaluation today patient's wound is actually starting to soften up as far as the eschar is concerned actually think we may be able to get some of that off today and I discussed that with the patient. He is in agreement with given the a shot to do this. 06-22-2023 upon evaluation today patient appears to be doing much better in regard to his heel actually feel like most of the wounds are actually making significant improvement here which is great news and are very pleased in that regard. I do not see any signs of active infection looking or systemically at this time which is great news and in general I do  believe that we are making good headway towards complete  closure. 10-7 24 upon evaluation today patient appears to be doing well currently in regard to his wound. Again the heel is showing signs of excellent improvement and actually very pleased with where we stand I do believe that he is doing quite well currently. 07-05-2023 upon evaluation today patient appears to be doing well currently in regard to his wound. He is actually showing signs of improvement which is great news and in general I do believe that we are making headway towards complete closure which is great news. I think that he is doing well currently with the Iodoflex. 07-12-2023 upon evaluation patient's wound bed actually showed signs of good granulation epithelization at this point. He is tolerating dressing changes without ATANACIO, DUFFORD ([id number]) [id number] Page 6 of 8 complication and good news is I feel like they were moving in the right direction here. I do not see any signs of active infection at this time. 07-19-2023 upon evaluation today patient appears to be doing very well in regard to his wound the heel actually showing signs of dramatic improvement at this time. There does not appear to be any signs of active infection which is good news. No fevers, chills, nausea, vomiting, or diarrhea. 08-02-2023 upon evaluation today patient appears to be doing well currently in regard to his wound. He has been tolerating the dressing changes without complication. Fortunately there does not appear to be any signs of active infection locally or systemically which is great news and in general I think that we are moving in the right direction here. There is some biofilm noted on the surface of the wound. 08-09-2023 upon evaluation today patient appears to be doing well currently in regard to his wound. He has been tolerating the dressing changes without complication. Fortunately I do not see any signs of  active infection locally or systemically at this time which is great news. No fevers, chills, nausea, vomiting, or diarrhea. 08-16-2023 upon evaluation today patient appears to be doing well currently in regard to his heel ulcer. He has been tolerating the dressing changes without complication. Fortunately I do not see any signs of active infection locally or systemically which is great news. 08-30-2023 upon evaluation today patient appears to be doing excellent currently in regard to his heel. He has been tolerating the dressing changes without complication. With that being said I think his heel is making excellent progress here. Objective Constitutional Well-nourished and well-hydrated in no acute distress. Vitals Time Taken: 8:44 AM, Height: 73 in, Weight: 205 lbs, BMI: 27, Temperature: 98.1 F, Pulse: 76 bpm, Respiratory Rate: 18 breaths/min, Blood Pressure: 147/82 mmHg. Respiratory normal breathing without difficulty. Psychiatric this patient is able to make decisions and demonstrates good insight into disease process. Alert and Oriented x 3. pleasant and cooperative. General Notes: Upon inspection patient's wound bed actually showed signs of good granulation and epithelization at this point. Fortunately I do not see any signs of infection at this time. I think that postdebridement the wound bed looks much better. Integumentary (Hair, Skin) Wound #1 status is Open. Original cause of wound was Gradually Appeared. The date acquired was: 05/20/2023. The wound has been in treatment 13 weeks. The wound is located on the Right Calcaneus. The wound measures 1.5cm length x 1.4cm width x 0.1cm depth; 1.649cm^2 area and 0.165cm^3 volume. There is Fat Layer (Subcutaneous Tissue) exposed. There is a medium amount of serosanguineous drainage noted. There is no granulation within the wound bed. There is a large (67-100%) amount of necrotic  tissue within the wound bed including Eschar. Assessment Active  Problems ICD-10 Pressure ulcer of right heel, unstageable Essential (primary) hypertension Procedures Wound #1 Pre-procedure diagnosis of Wound #1 is a Pressure Ulcer located on the Right Calcaneus . There was a Excisional Skin/Subcutaneous Tissue Debridement with a total area of 1.65 sq cm performed by Allen Derry, PA-C. With the following instrument(s): Curette to remove Viable and Non-Viable tissue/material. Material removed includes Callus, Subcutaneous Tissue, and Slough after achieving pain control using Lidocaine 2% T opical Liquid. No specimens were taken. A time out was conducted at 09:09, prior to the start of the procedure. A Minimum amount of bleeding was controlled with Pressure. The procedure was tolerated well with a pain level of 0 throughout and a pain level of 0 following the procedure. Post Debridement Measurements: 1.5cm length x 1.4cm width x 0.1cm depth; 0.165cm^3 volume. Post debridement Stage noted as Category/Stage IV. Character of Wound/Ulcer Post Debridement is stable. Post procedure Diagnosis Wound #1: Same as Pre-Procedure Plan YIANNI, VROMAN ([id number]) [id number] Page 7 of 8 Follow-up Appointments: Return Appointment in 1 week. Nurse Visit as needed Bathing/ Shower/ Hygiene: May shower; gently cleanse wound with antibacterial soap, rinse and pat dry prior to dressing wounds Anesthetic (Use 'Patient Medications' Section for Anesthetic Order Entry): Lidocaine applied to wound bed WOUND #1: - Calcaneus Wound Laterality: Right Cleanser: Soap and Water 3 x Per Week/30 Days Discharge Instructions: Use as directed. Prim Dressing: Hydrofera Blue Ready Transfer Foam, 2.5x2.5 (in/in) (Generic) 3 x Per Week/30 Days ary Discharge Instructions: Apply Hydrofera Blue Ready to wound bed as directed Secondary Dressing: ABD Pad 5x9 (in/in) (Generic) 3 x Per Week/30 Days Discharge Instructions: Cover with ABD pad Secured With: Kerlix Roll  Sterile or Non-Sterile 6-ply 4.5x4 (yd/yd) (Generic) 3 x Per Week/30 Days Discharge Instructions: Apply Kerlix as directed 1. Would recommend that we have the patient continue to monitor for any signs of infection or worsening. Based on what I see I do believe that he is actually doing excellent. 2. I am going to recommend that we continue with the [facility]. 3. We continue with ABD pad and roll gauze. We will see patient back for reevaluation in 1 week here in the clinic. If anything worsens or changes patient will contact our office for additional recommendations. Electronic Signature(s) Signed: 08/30/2023 5:43:43 PM By: Allen Derry PA-C Entered By: Allen Derry on 08/30/2023 17:43:43 -------------------------------------------------------------------------------- SuperBill Details Patient Name: Date of Service: [name], [name] 08/30/2023 Medical Record Number: [id number] Patient Account Number: [id number] Date of Birth/Sex: Treating RN: 03/03/1966 (57 y.o. [name] Primary Care Provider: Eleanora Neighbor Other Clinician: Referring Provider: Treating Provider/Extender: Brandt Loosen in Treatment: 13 Diagnosis Coding ICD-10 Codes Code Description L89.610 Pressure ulcer of right heel, unstageable I10 Essential (primary) hypertension Facility Procedures : CPT4 Code: [id number] Description: 11042 - DEB SUBQ TISSUE 20 SQ CM/< ICD-10 Diagnosis Description L89.610 Pressure ulcer of right heel, unstageable Modifier: Quantity: 1 Physician Procedures Electronic Signature(s) Signed: 08/30/2023 5:46:08 PM By: Allen Derry PA-C Entered By: Allen Derry on 08/30/2023 17:46:08

## 2023-09-06 ENCOUNTER — Encounter: Payer: Managed Care, Other (non HMO) | Admitting: Physician Assistant

## 2023-09-06 DIAGNOSIS — L89610 Pressure ulcer of right heel, unstageable: Secondary | ICD-10-CM | POA: Diagnosis not present

## 2023-09-06 NOTE — Progress Notes (Signed)
BARRIE, SCHWITZER ([id number]) [id number] Page 1 of 8 Visit Report for 09/06/2023 Chief Complaint Document Details Patient Name: Date of Service: Robert Cleveland T. 09/06/2023 8:30 A M Medical Record Number: [id number] Patient Account Number: [id number] Date of Birth/Sex: Treating RN: 12/24/1965 (57 y.o. [name] Primary Care Provider: Eleanora Neighbor Other Clinician: Referring Provider: Treating Provider/Extender: Brandt Loosen in Treatment: 14 Information Obtained from: Patient Chief Complaint Right heel ulcer Electronic Signature(s) Signed: 09/06/2023 8:41:47 AM By: Allen Derry PA-C Entered By: Allen Derry on 09/06/2023 05:41:46 -------------------------------------------------------------------------------- Debridement Details Patient Name: Date of Service: [name], [name] 09/06/2023 8:30 A M Medical Record Number: [id number] Patient Account Number: [id number] Date of Birth/Sex: Treating RN: [birth year] (57 y.o. [name] Primary Care Provider: Eleanora Neighbor Other Clinician: Referring Provider: Treating Provider/Extender: Brandt Loosen in Treatment: 14 Debridement Performed for Assessment: Wound #1 Right Calcaneus Performed By: Physician Allen Derry, PA-C The following information was scribed by: Midge Aver The information was scribed for: Allen Derry Debridement Type: Debridement Level of Consciousness (Pre-procedure): Awake and Alert Pre-procedure Verification/Time Out Yes - 08:56 Taken: Start Time: 08:56 Percent of Wound Bed Debrided: 100% T Area Debrided (cm): otal 1.02 Tissue and other material debrided: Viable, Non-Viable, Callus, Slough, Subcutaneous, Slough Level: Skin/Subcutaneous Tissue Debridement Description: Excisional Instrument: Curette Bleeding: Minimum Hemostasis Achieved: Pressure Procedural Pain: 0 BUCKY, LENKIEWICZ T ([id number])  [id number] Page 2 of 8 Post Procedural Pain: 0 Response to Treatment: Procedure was tolerated well Level of Consciousness (Post- Awake and Alert procedure): Post Debridement Measurements of Total Wound Length: (cm) 1 Stage: Category/Stage IV Width: (cm) 1.3 Depth: (cm) 0.1 Volume: (cm) 0.102 Character of Wound/Ulcer Post Debridement: Stable Post Procedure Diagnosis Same as Pre-procedure Electronic Signature(s) Signed: 09/06/2023 4:20:01 PM By: Allen Derry PA-C Signed: 09/07/2023 4:17:14 PM By: Midge Aver MSN RN CNS WTA Entered By: Midge Aver on 09/06/2023 05:58:41 -------------------------------------------------------------------------------- HPI Details Patient Name: Date of Service: [name], [name] 09/06/2023 8:30 A M Medical Record Number: [id number] Patient Account Number: [id number] Date of Birth/Sex: Treating RN: [birth year] (57 y.o. [name] Primary Care Provider: Eleanora Neighbor Other Clinician: Referring Provider: Treating Provider/Extender: Brandt Loosen in Treatment: 14 History of Present Illness HPI Description: 05-31-2023 patient presents today for initial evaluation here in the clinic concerning issues that he is having with an eschar over the right heel. Unfortunately this came on seemingly fairly suddenly when he was at work. He does work around [employer] but states he was not doing anything directly with them at that point. Again I am unsure exactly what went on here and to what degree the chemicals could have played a role in this but this almost looks to me more like damage secondary to a chemical burn versus a strict pressure ulcer although I am unsure to be certain. The issue here is simply that he has 3 areas that are somewhat separated by some skin in between although to some degree connected as well which is very necrotic and eschar covered and to be honest getting any dressing to this is  not really going to be of benefit at this point. Fortunately I do not see any signs of active infection at this time which is good news. With that being said I do think that this is gena be difficult to get heal until we get the eschar off of the area. The patient voiced understanding and his wife was present during  the evaluation today as well. He does have hypertension but no other major medical problems. T my knowledge this is not a workers o comp case although I am unsure whether or not it should be to be honest. 06-08-2023 upon evaluation today patient appears to be doing well currently in regard to his wound. He has been tolerating the dressing changes without complication. Fortunately there does not appear to be any signs of active infection locally or systemically which is great news. With that being said he still has a pretty much eschar covering he did get the Santyl after a bit of a fight although he only got 2 tubes this may not last a full month but hopefully we will need it the whole month and this will work out just fine for [place] will make do with what we can get. 06-15-2023 upon evaluation today patient's wound is actually starting to soften up as far as the eschar is concerned actually think we may be able to get some of that off today and I discussed that with the patient. He is in agreement with given the a shot to do this. 06-22-2023 upon evaluation today patient appears to be doing much better in regard to his heel actually feel like most of the wounds are actually making significant improvement here which is great news and are very pleased in that regard. I do not see any signs of active infection looking or systemically at this time which is great news and in general I do believe that we are making good headway towards complete closure. 10-7 24 upon evaluation today patient appears to be doing well currently in regard to his wound. Again the heel is showing signs of excellent  improvement and actually very pleased with where we stand I do believe that he is doing quite well currently. 07-05-2023 upon evaluation today patient appears to be doing well currently in regard to his wound. He is actually showing signs of improvement which is great news and in general I do believe that we are making headway towards complete closure which is great news. I think that he is doing well currently with the Iodoflex. 07-12-2023 upon evaluation patient's wound bed actually showed signs of good granulation epithelization at this point. He is tolerating dressing changes without complication and good news is I feel like they were moving in the right direction here. I do not see any signs of active infection at this time. 07-19-2023 upon evaluation today patient appears to be doing very well in regard to his wound the heel actually showing signs of dramatic improvement at this time. There does not appear to be any signs of active infection which is good news. No fevers, chills, nausea, vomiting, or diarrhea. [name], [name] ([id number]) [id number] Page 3 of 8 08-02-2023 upon evaluation today patient appears to be doing well currently in regard to his wound. He has been tolerating the dressing changes without complication. Fortunately there does not appear to be any signs of active infection locally or systemically which is great news and in general I think that we are moving in the right direction here. There is some biofilm noted on the surface of the wound. 08-09-2023 upon evaluation today patient appears to be doing well currently in regard to his wound. He has been tolerating the dressing changes without complication. Fortunately I do not see any signs of active infection locally or systemically at this time which is great news. No fevers, chills, nausea, vomiting, or diarrhea.  08-16-2023 upon evaluation today patient appears to be doing well currently in  regard to his heel ulcer. He has been tolerating the dressing changes without complication. Fortunately I do not see any signs of active infection locally or systemically which is great news. 08-30-2023 upon evaluation today patient appears to be doing excellent currently in regard to his heel. He has been tolerating the dressing changes without complication. With that being said I think his heel is making excellent progress here. 09-06-2023 upon evaluation today patient's wound is actually showing signs of excellent improvement I am very pleased with where things stand today. Fortunately I do not see any evidence of active infection at this time which is great news and in general I do believe that we are making really good headway here towards closure which is great news. Electronic Signature(s) Signed: 09/06/2023 10:07:22 AM By: Allen Derry PA-C Entered By: Allen Derry on 09/06/2023 07:07:22 -------------------------------------------------------------------------------- Physical Exam Details Patient Name: Date of Service: [name], [name] 09/06/2023 8:30 A M Medical Record Number: [id number] Patient Account Number: [id number] Date of Birth/Sex: Treating RN: 12/06/1965 (57 y.o. [name] Primary Care Provider: Eleanora Neighbor Other Clinician: Referring Provider: Treating Provider/Extender: Brandt Loosen in Treatment: 14 Constitutional Well-nourished and well-hydrated in no acute distress. Respiratory normal breathing without difficulty. Psychiatric this patient is able to make decisions and demonstrates good insight into disease process. Alert and Oriented x 3. pleasant and cooperative. Notes Patient's wound bed showed signs of good granulation epithelization at this point. Fortunately I do not see any evidence of worsening overall and I do believe that the patient is making excellent headway towards closure which is great news. Electronic  Signature(s) Signed: 09/06/2023 10:07:39 AM By: Allen Derry PA-C Entered By: Allen Derry on 09/06/2023 07:07:39 [name] ([id number]) [id number] Page 4 of 8 -------------------------------------------------------------------------------- Physician Orders Details Patient Name: Date of Service: Robert Cleveland T. 09/06/2023 8:30 A M Medical Record Number: [id number] Patient Account Number: [id number] Date of Birth/Sex: Treating RN: Aug 07, 1966 (57 y.o. [name] Primary Care Provider: Eleanora Neighbor Other Clinician: Referring Provider: Treating Provider/Extender: Brandt Loosen in Treatment: 14 The following information was scribed by: Midge Aver The information was scribed for: Allen Derry Verbal / Phone Orders: No Diagnosis Coding ICD-10 Coding Code Description L89.610 Pressure ulcer of right heel, unstageable I10 Essential (primary) hypertension Follow-up Appointments Return Appointment in 1 week. Nurse Visit as needed Bathing/ Shower/ Hygiene May shower; gently cleanse wound with antibacterial soap, rinse and pat dry prior to dressing wounds Anesthetic (Use 'Patient Medications' Section for Anesthetic Order Entry) Lidocaine applied to wound bed Wound Treatment Wound #1 - Calcaneus Wound Laterality: Right Cleanser: Soap and Water 3 x Per Week/30 Days Discharge Instructions: Use as directed. Peri-Wound Care: AandD Ointment 3 x Per Week/30 Days Discharge Instructions: Apply AandD Ointment as directed Prim Dressing: Hydrofera Blue Ready Transfer Foam, 2.5x2.5 (in/in) (Generic) 3 x Per Week/30 Days ary Discharge Instructions: Apply Hydrofera Blue Ready to wound bed as directed Secondary Dressing: ABD Pad 5x9 (in/in) (Generic) 3 x Per Week/30 Days Discharge Instructions: Cover with ABD pad Secured With: Kerlix Roll Sterile or Non-Sterile 6-ply 4.5x4 (yd/yd) (Generic) 3 x Per Week/30 Days Discharge  Instructions: Apply Kerlix as directed Electronic Signature(s) Signed: 09/06/2023 4:20:01 PM By: Allen Derry PA-C Signed: 09/07/2023 4:17:14 PM By: Midge Aver MSN RN CNS WTA Entered By: Midge Aver on 09/06/2023 05:58:22 -------------------------------------------------------------------------------- Problem List Details Patient Name: Date of Service:  Robert YLO R, [name] 09/06/2023 8:30 A M Medical Record Number: [id number] Patient Account Number: [id number] Date of Birth/Sex: Treating RN: 03/24/1966 (57 y.o. [name] Primary Care Provider: Eleanora Neighbor Other Clinician: Referring Provider: Treating Provider/Extender: Brandt Loosen in Treatment: [address] [place] T ([id number]) [id number] Page 5 of 8 Active Problems ICD-10 Encounter Code Description Active Date MDM Diagnosis L89.610 Pressure ulcer of right heel, unstageable 05/31/2023 No Yes I10 Essential (primary) hypertension 05/31/2023 No Yes Inactive Problems Resolved Problems Electronic Signature(s) Signed: 09/06/2023 8:41:44 AM By: Allen Derry PA-C Entered By: Allen Derry on 09/06/2023 05:41:44 -------------------------------------------------------------------------------- Progress Note Details Patient Name: Date of Service: [name], [name] 09/06/2023 8:30 A M Medical Record Number: [id number] Patient Account Number: [id number] Date of Birth/Sex: Treating RN: 1966-05-31 (57 y.o. [name] Primary Care Provider: Eleanora Neighbor Other Clinician: Referring Provider: Treating Provider/Extender: Brandt Loosen in Treatment: 14 Subjective Chief Complaint Information obtained from Patient Right heel ulcer History of Present Illness (HPI) 05-31-2023 patient presents today for initial evaluation here in the clinic concerning issues that he is having with an eschar over the right heel. Unfortunately this came on seemingly fairly  suddenly when he was at work. He does work around [employer] but states he was not doing anything directly with them at that point. Again I am unsure exactly what went on here and to what degree the chemicals could have played a role in this but this almost looks to me more like damage secondary to a chemical burn versus a strict pressure ulcer although I am unsure to be certain. The issue here is simply that he has 3 areas that are somewhat separated by some skin in between although to some degree connected as well which is very necrotic and eschar covered and to be honest getting any dressing to this is not really going to be of benefit at this point. Fortunately I do not see any signs of active infection at this time which is good news. With that being said I do think that this is gena be difficult to get heal until we get the eschar off of the area. The patient voiced understanding and his wife was present during the evaluation today as well. He does have hypertension but no other major medical problems. T my knowledge this is not a workers comp o case although I am unsure whether or not it should be to be honest. 06-08-2023 upon evaluation today patient appears to be doing well currently in regard to his wound. He has been tolerating the dressing changes without complication. Fortunately there does not appear to be any signs of active infection locally or systemically which is great news. With that being said he still has a pretty much eschar covering he did get the Santyl after a bit of a fight although he only got 2 tubes this may not last a full month but hopefully we will need it the whole month and this will work out just fine for [place] will make do with what we can get. 06-15-2023 upon evaluation today patient's wound is actually starting to soften up as far as the eschar is concerned actually think we may be able to get some of that off today and I discussed that with the patient. He is in  agreement with given the a shot to do this. 06-22-2023 upon evaluation today patient appears to be doing much better in regard to his heel actually feel  like most of the wounds are actually making significant improvement here which is great news and are very pleased in that regard. I do not see any signs of active infection looking or systemically at this time which is great news and in general I do believe that we are making good headway towards complete closure. 10-7 24 upon evaluation today patient appears to be doing well currently in regard to his wound. Again the heel is showing signs of excellent improvement and actually very pleased with where we stand I do believe that he is doing quite well currently. IBIN, CIPRES ([id number]) [id number] Page 6 of 8 07-05-2023 upon evaluation today patient appears to be doing well currently in regard to his wound. He is actually showing signs of improvement which is great news and in general I do believe that we are making headway towards complete closure which is great news. I think that he is doing well currently with the Iodoflex. 07-12-2023 upon evaluation patient's wound bed actually showed signs of good granulation epithelization at this point. He is tolerating dressing changes without complication and good news is I feel like they were moving in the right direction here. I do not see any signs of active infection at this time. 07-19-2023 upon evaluation today patient appears to be doing very well in regard to his wound the heel actually showing signs of dramatic improvement at this time. There does not appear to be any signs of active infection which is good news. No fevers, chills, nausea, vomiting, or diarrhea. 08-02-2023 upon evaluation today patient appears to be doing well currently in regard to his wound. He has been tolerating the dressing changes without complication. Fortunately there does not appear to be any  signs of active infection locally or systemically which is great news and in general I think that we are moving in the right direction here. There is some biofilm noted on the surface of the wound. 08-09-2023 upon evaluation today patient appears to be doing well currently in regard to his wound. He has been tolerating the dressing changes without complication. Fortunately I do not see any signs of active infection locally or systemically at this time which is great news. No fevers, chills, nausea, vomiting, or diarrhea. 08-16-2023 upon evaluation today patient appears to be doing well currently in regard to his heel ulcer. He has been tolerating the dressing changes without complication. Fortunately I do not see any signs of active infection locally or systemically which is great news. 08-30-2023 upon evaluation today patient appears to be doing excellent currently in regard to his heel. He has been tolerating the dressing changes without complication. With that being said I think his heel is making excellent progress here. 09-06-2023 upon evaluation today patient's wound is actually showing signs of excellent improvement I am very pleased with where things stand today. Fortunately I do not see any evidence of active infection at this time which is great news and in general I do believe that we are making really good headway here towards closure which is great news. Objective Constitutional Well-nourished and well-hydrated in no acute distress. Vitals Time Taken: 8:38 AM, Height: 73 in, Weight: 205 lbs, BMI: 27, Temperature: 98.4 F, Pulse: 68 bpm, Respiratory Rate: 18 breaths/min, Blood Pressure: 143/94 mmHg. Respiratory normal breathing without difficulty. Psychiatric this patient is able to make decisions and demonstrates good insight into disease process. Alert and Oriented x 3. pleasant and cooperative. General Notes: Patient's wound bed showed signs of good granulation  epithelization at  this point. Fortunately I do not see any evidence of worsening overall and I do believe that the patient is making excellent headway towards closure which is great news. Integumentary (Hair, Skin) Wound #1 status is Open. Original cause of wound was Gradually Appeared. The date acquired was: 05/20/2023. The wound has been in treatment 14 weeks. The wound is located on the Right Calcaneus. The wound measures 1cm length x 1.3cm width x 0.1cm depth; 1.021cm^2 area and 0.102cm^3 volume. There is Fat Layer (Subcutaneous Tissue) exposed. There is a medium amount of serosanguineous drainage noted. There is no granulation within the wound bed. There is a large (67-100%) amount of necrotic tissue within the wound bed including Eschar. Assessment Active Problems ICD-10 Pressure ulcer of right heel, unstageable Essential (primary) hypertension Procedures Wound #1 Pre-procedure diagnosis of Wound #1 is a Pressure Ulcer located on the Right Calcaneus . There was a Excisional Skin/Subcutaneous Tissue Debridement with a total area of 1.02 sq cm performed by Allen Derry, PA-C. With the following instrument(s): Curette to remove Viable and Non-Viable tissue/material. Material removed includes Callus, Subcutaneous Tissue, and Slough. No specimens were taken. A time out was conducted at 08:56, prior to the start of the procedure. A Minimum amount of bleeding was controlled with Pressure. The procedure was tolerated well with a pain level of 0 throughout and a pain level of 0 following the procedure. Post Debridement Measurements: 1cm length x 1.3cm width x 0.1cm depth; 0.102cm^3 volume. Post debridement Stage noted as Category/Stage IV. Character of Wound/Ulcer Post Debridement is stable. Post procedure Diagnosis Wound #1: Same as Pre-Procedure [name], [name] ([id number]) [id number] Page 7 of 8 Plan Follow-up Appointments: Return Appointment in 1 week. Nurse Visit as  needed Bathing/ Shower/ Hygiene: May shower; gently cleanse wound with antibacterial soap, rinse and pat dry prior to dressing wounds Anesthetic (Use 'Patient Medications' Section for Anesthetic Order Entry): Lidocaine applied to wound bed WOUND #1: - Calcaneus Wound Laterality: Right Cleanser: Soap and Water 3 x Per Week/30 Days Discharge Instructions: Use as directed. Peri-Wound Care: AandD Ointment 3 x Per Week/30 Days Discharge Instructions: Apply AandD Ointment as directed Prim Dressing: Hydrofera Blue Ready Transfer Foam, 2.5x2.5 (in/in) (Generic) 3 x Per Week/30 Days ary Discharge Instructions: Apply Hydrofera Blue Ready to wound bed as directed Secondary Dressing: ABD Pad 5x9 (in/in) (Generic) 3 x Per Week/30 Days Discharge Instructions: Cover with ABD pad Secured With: Kerlix Roll Sterile or Non-Sterile 6-ply 4.5x4 (yd/yd) (Generic) 3 x Per Week/30 Days Discharge Instructions: Apply Kerlix as directed 1. I would recommend that we have the patient continue to monitor for any evidence of infection or worsening. Based on what I see I do think that we are making really good headway towards closure I am hopeful that the patient will continue to see improvements over the next week. In fact I think he is probably within a week or 2 of closure completely. 2. We will continue with the Hydrofera Blue postdebridement the wound bed looks really receptive to this. The 1 thing we will do is add some AandD ointment around the edges of the wound just to help with the dry skin that is occurring here. We will see patient back for reevaluation in 1 week here in the clinic. If anything worsens or changes patient will contact our office for additional recommendations. Electronic Signature(s) Signed: 09/06/2023 10:08:13 AM By: Allen Derry PA-C Entered By: Allen Derry on 09/06/2023 07:08:13 -------------------------------------------------------------------------------- SuperBill Details Patient Name:  Date of Service: Robert  Robert Boxer T. 09/06/2023 Medical Record Number: [id number] Patient Account Number: [id number] Date of Birth/Sex: Treating RN: 16-Aug-1966 (57 y.o. [name] Primary Care Provider: Eleanora Neighbor Other Clinician: Referring Provider: Treating Provider/Extender: Brandt Loosen in Treatment: 14 Diagnosis Coding ICD-10 Codes Code Description L89.610 Pressure ulcer of right heel, unstageable I10 Essential (primary) hypertension Facility Procedures : KOI, EICHENBERG Code: [id number] [facility] T ([id number]) Description: 11042 - DEB SUBQ TISSUE 20 SQ CM/< ICD-10 Diagnosis Description L89.610 Pressure ulcer of right heel, unstageable [id number]: [id number] Page 8 of 8 Quantity: 1 Physician Procedures : CPT4 Code Description Modifier 11042 11042 - WC PHYS SUBQ TISS 20 SQ CM ICD-10 Diagnosis Description L89.610 Pressure ulcer of right heel, unstageable Quantity: 1 Electronic Signature(s) Signed: 09/06/2023 10:08:22 AM By: Allen Derry PA-C Entered By: Allen Derry on 09/06/2023 30:86:57

## 2023-09-08 NOTE — Progress Notes (Signed)
EVRETT, HOMMERDING [name] ([id number]) [id number] Page 1 of 8 Visit Report for 09/06/2023 Arrival Information Details Patient Name: Date of Service: Robert Lav [place] [name]. 09/06/2023 8:30 A M Medical Record Number: [id number] Patient Account Number: [id number] Date of Birth/Sex: Treating RN: 08-24-1966 (57 y.o. [name] Primary Care Jaidin Richison: Eleanora Neighbor Other Clinician: Referring Romey Cohea: Treating Demitria Hay/Extender: Brandt Loosen in Treatment: 14 Visit Information History Since Last Visit Added or deleted any medications: No Patient Arrived: Ambulatory Any new allergies or adverse reactions: No Arrival Time: 08:35 Had a fall or experienced change in No Accompanied By: self activities of daily living that may affect Transfer Assistance: None risk of falls: Patient Identification Verified: Yes Has Dressing in Place as Prescribed: Yes Patient Requires Transmission-Based Precautions: No Pain Present Now: No Patient Has Alerts: Yes Patient Alerts: Not diabetic Electronic Signature(s) Signed: 09/07/2023 4:17:14 PM By: Midge Aver MSN RN CNS WTA Entered By: Midge Aver on 09/06/2023 05:37:13 -------------------------------------------------------------------------------- Clinic Level of Care Assessment Details Patient Name: Date of Service: Robert Lav [place] [name]. 09/06/2023 8:30 A M Medical Record Number: [id number] Patient Account Number: [id number] Date of Birth/Sex: Treating RN: [birth year] (57 y.o. [name] Primary Care Emmanual Gauthreaux: Eleanora Neighbor Other Clinician: Referring Morrison Masser: Treating Sondi Desch/Extender: Brandt Loosen in Treatment: 14 Clinic Level of Care Assessment Items TOOL 1 Quantity Score []  - 0 Use when EandM and Procedure is performed on INITIAL visit ASSESSMENTS - Nursing Assessment / Reassessment []  - 0 General Physical Exam (combine w/ comprehensive assessment (listed just  below) when performed on new pt. evals) []  - 0 Comprehensive Assessment (HX, ROS, Risk Assessments, Wounds Hx, etc.) ASSESSMENTS - Wound and Skin Assessment / Reassessment []  - 0 Dermatologic / Skin Assessment (not related to wound area) ASSESSMENTS - Ostomy and/or Continence Assessment and Care REGNIALD, YEISER ([id number]) [id number] Page 2 of 8 []  - 0 Incontinence Assessment and Management []  - 0 Ostomy Care Assessment and Management (repouching, etc.) PROCESS - Coordination of Care []  - 0 Simple Patient / Family Education for ongoing care []  - 0 Complex (extensive) Patient / Family Education for ongoing care []  - 0 Staff obtains Chiropractor, Records, [name] Results / Process Orders est []  - 0 Staff telephones HHA, Nursing Homes / Clarify orders / etc []  - 0 Routine Transfer to another Facility (non-emergent condition) []  - 0 Routine Hospital Admission (non-emergent condition) []  - 0 New Admissions / Manufacturing engineer / Ordering NPWT Apligraf, etc. , []  - 0 Emergency Hospital Admission (emergent condition) PROCESS - Special Needs []  - 0 Pediatric / Minor Patient Management []  - 0 Isolation Patient Management []  - 0 Hearing / Language / Visual special needs []  - 0 Assessment of Community assistance (transportation, D/C planning, etc.) []  - 0 Additional assistance / Altered mentation []  - 0 Support Surface(s) Assessment (bed, cushion, seat, etc.) INTERVENTIONS - Miscellaneous []  - 0 External ear exam []  - 0 Patient Transfer (multiple staff / Nurse, adult / Similar devices) []  - 0 Simple Staple / Suture removal (25 or less) []  - 0 Complex Staple / Suture removal (26 or more) []  - 0 Hypo/Hyperglycemic Management (do not check if billed separately) []  - 0 Ankle / Brachial Index (ABI) - do not check if billed separately Has the patient been seen at the hospital within the last three years: Yes Total Score: 0 Level Of Care: ____ Electronic  Signature(s) Signed: 09/07/2023 4:17:14 PM By: Midge Aver MSN RN CNS WTA Entered By: Katrinka Blazing,  Chip Boer on 09/06/2023 05:58:54 -------------------------------------------------------------------------------- Encounter Discharge Information Details Patient Name: Date of Service: Robert [place] [name]. 09/06/2023 8:30 A M Medical Record Number: [id number] Patient Account Number: [id number] Date of Birth/Sex: Treating RN: [birth year] (57 y.o. [name] Primary Care Catrice Zuleta: Eleanora Neighbor Other Clinician: Referring Laketa Sandoz: Treating Danesha Kirchoff/Extender: Brandt Loosen in Treatment: [id number] Encounter Discharge Information Items Post Procedure Vitals Discharge Condition: Stable Temperature (F): 98.4 [name], [name] ([id number]) [id number] Page 3 of 8 Ambulatory Status: Ambulatory Pulse (bpm): 68 Discharge Destination: Home Respiratory Rate (breaths/min): 18 Transportation: Private Auto Blood Pressure (mmHg): 143/94 Accompanied By: self Schedule Follow-up Appointment: Yes Clinical Summary of Care: Electronic Signature(s) Signed: 09/07/2023 4:17:14 PM By: Midge Aver MSN RN CNS WTA Entered By: Midge Aver on 09/06/2023 06:01:04 -------------------------------------------------------------------------------- Lower Extremity Assessment Details Patient Name: Date of Service: [name], Robert [name]. 09/06/2023 8:30 A M Medical Record Number: [id number] Patient Account Number: [id number] Date of Birth/Sex: Treating RN: 10-25-1965 (57 y.o. [name] Primary Care Berklie Dethlefs: Eleanora Neighbor Other Clinician: Referring Paysley Poplar: Treating Modesto Ganoe/Extender: Brandt Loosen in Treatment: 14 Electronic Signature(s) Signed: 09/07/2023 4:17:14 PM By: Midge Aver MSN RN CNS WTA Entered By: Midge Aver on 09/06/2023 05:44:55 -------------------------------------------------------------------------------- Multi Wound  Chart Details Patient Name: Date of Service: [name], Robert [name]. 09/06/2023 8:30 A M Medical Record Number: [id number] Patient Account Number: [id number] Date of Birth/Sex: Treating RN: 1966/04/09 (57 y.o. [name] Primary Care Keelin Sheridan: Eleanora Neighbor Other Clinician: Referring Raelan Burgoon: Treating Buzz Axel/Extender: Brandt Loosen in Treatment: 14 Vital Signs Height(in): 73 Pulse(bpm): 68 Weight(lbs): 205 Blood Pressure(mmHg): 143/94 Body Mass Index(BMI): 27 Temperature(F): 98.4 Respiratory Rate(breaths/min): 18 [1:Photos:] [N/A:N/A] Right Calcaneus N/A N/A Wound Location: Gradually Appeared N/A N/A Wounding Event: Pressure Ulcer N/A N/A Primary Etiology: Hypertension N/A N/A Comorbid History: 05/20/2023 N/A N/A Date Acquired: 14 N/A N/A Weeks of Treatment: Open N/A N/A Wound Status: No N/A N/A Wound Recurrence: Yes N/A N/A Clustered Wound: 1x1.3x0.1 N/A N/A Measurements L x W x D (cm) 1.021 N/A N/A A (cm) : rea 0.102 N/A N/A Volume (cm) : 97.10% N/A N/A % Reduction in A rea: 98.60% N/A N/A % Reduction in Volume: Category/Stage IV N/A N/A Classification: Medium N/A N/A Exudate A mount: Serosanguineous N/A N/A Exudate Type: red, brown N/A N/A Exudate Color: None Present (0%) N/A N/A Granulation A mount: Large (67-100%) N/A N/A Necrotic A mount: Eschar N/A N/A Necrotic Tissue: Fat Layer (Subcutaneous Tissue): Yes N/A N/A Exposed Structures: Fascia: No Tendon: No Muscle: No Joint: No Bone: No None N/A N/A Epithelialization: Treatment Notes Electronic Signature(s) Signed: 09/07/2023 4:17:14 PM By: Midge Aver MSN RN CNS WTA Entered By: Midge Aver on 09/06/2023 05:46:49 -------------------------------------------------------------------------------- Multi-Disciplinary Care Plan Details Patient Name: Date of Service: [name], Robert [name]. 09/06/2023 8:30 A M Medical Record Number: [id number] Patient Account  Number: [id number] Date of Birth/Sex: Treating RN: [birth year] (57 y.o. [name] Primary Care Kynadee Dam: Eleanora Neighbor Other Clinician: Referring Tayten Heber: Treating Quamir Willemsen/Extender: Brandt Loosen in Treatment: 14 Active Inactive Necrotic Tissue Nursing Diagnoses: Impaired tissue integrity related to necrotic/devitalized tissue Knowledge deficit related to management of necrotic/devitalized tissue Goals: Necrotic/devitalized tissue will be minimized in the wound bed Date Initiated: 05/31/2023 Date Inactivated: 06/28/2023 Target Resolution Date: 06/30/2023 Goal Status: [name], [name] ([id number]) [phone].pdf Page 5 of 8 Patient/caregiver will verbalize understanding of reason and process for debridement of necrotic tissue Date Initiated: 05/31/2023 Target Resolution Date: 09/20/2023  Goal Status: Active Interventions: Assess patient pain level pre-, during and post procedure and prior to discharge Provide education on necrotic tissue and debridement process Treatment Activities: Apply topical anesthetic as ordered : 05/31/2023 Enzymatic debridement : 05/31/2023 Notes: Wound/Skin Impairment Nursing Diagnoses: Impaired tissue integrity Knowledge deficit related to ulceration/compromised skin integrity Goals: Patient/caregiver will verbalize understanding of skin care regimen Date Initiated: 05/31/2023 Date Inactivated: 06/28/2023 Target Resolution Date: 06/30/2023 Goal Status: Met Ulcer/skin breakdown will have a volume reduction of 30% by week 4 Date Initiated: 05/31/2023 Date Inactivated: 06/28/2023 Target Resolution Date: 06/30/2023 Goal Status: Met Ulcer/skin breakdown will have a volume reduction of 50% by week 8 Date Initiated: 05/31/2023 Date Inactivated: 08/02/2023 Target Resolution Date: 07/31/2023 Goal Status: Met Ulcer/skin breakdown will have a volume reduction of 80% by week 12 Date Initiated: 05/31/2023 Date  Inactivated: 08/30/2023 Target Resolution Date: 08/30/2023 Goal Status: Met Ulcer/skin breakdown will heal within 14 weeks Date Initiated: 05/31/2023 Target Resolution Date: 09/21/2023 Goal Status: Active Interventions: Assess patient/caregiver ability to obtain necessary supplies Assess patient/caregiver ability to perform ulcer/skin care regimen upon admission and as needed Assess ulceration(s) every visit Provide education on ulcer and skin care Notes: Electronic Signature(s) Signed: 09/07/2023 4:17:14 PM By: Midge Aver MSN RN CNS WTA Entered By: Midge Aver on 09/06/2023 06:00:11 -------------------------------------------------------------------------------- Pain Assessment Details Patient Name: Date of Service: [name], Robert [name]. 09/06/2023 8:30 A M Medical Record Number: [id number] Patient Account Number: [id number] Date of Birth/Sex: Treating RN: 1966-03-06 (57 y.o. [name] Primary Care Kailena Lubas: Eleanora Neighbor Other Clinician: Referring Sanoe Hazan: Treating Katrell Milhorn/Extender: Brandt Loosen in Treatment: 14 Active Problems Location of Pain Severity and Description of Pain Patient Has Paino No [name], [name] ([id number]) [id number] Page 6 of 8 Patient Has Paino No Site Locations Pain Management and Medication Current Pain Management: Electronic Signature(s) Signed: 09/07/2023 4:17:14 PM By: Midge Aver MSN RN CNS WTA Entered By: Midge Aver on 09/06/2023 05:39:27 -------------------------------------------------------------------------------- Patient/Caregiver Education Details Patient Name: Date of Service: [name], Robert [name]. 12/16/2024andnbsp8:30 A M Medical Record Number: [id number] Patient Account Number: [id number] Date of Birth/Gender: Treating RN: 18-Jul-1966 (57 y.o. [name] Primary Care Physician: Eleanora Neighbor Other Clinician: Referring Physician: Treating Physician/Extender:  Brandt Loosen in Treatment: 14 Education Assessment Education Provided To: Patient Education Topics Provided Wound Debridement: Handouts: Wound Debridement Methods: Explain/Verbal Responses: State content correctly Wound/Skin Impairment: Handouts: Caring for Your Ulcer Methods: Explain/Verbal Responses: State content correctly Electronic Signature(s) Signed: 09/07/2023 4:17:14 PM By: Midge Aver MSN RN CNS WTA Entered By: Midge Aver on 09/06/2023 05:59:39 [name] ([id number]) [id number] Page 7 of 8 -------------------------------------------------------------------------------- Wound Assessment Details Patient Name: Date of Service: Robert [place] [name]. 09/06/2023 8:30 A M Medical Record Number: [id number] Patient Account Number: [id number] Date of Birth/Sex: Treating RN: 25-Dec-1965 (57 y.o. [name] Primary Care Isabeau Mccalla: Eleanora Neighbor Other Clinician: Referring Dick Hark: Treating Osiris Charles/Extender: Brandt Loosen in Treatment: 14 Wound Status Wound Number: 1 Primary Etiology: Pressure Ulcer Wound Location: Right Calcaneus Wound Status: Open Wounding Event: Gradually Appeared Comorbid History: Hypertension Date Acquired: 05/20/2023 Weeks Of Treatment: 14 Clustered Wound: Yes Photos Wound Measurements Length: (cm) 1 Width: (cm) 1.3 Depth: (cm) 0.1 Area: (cm) 1.02 Volume: (cm) 0.10 % Reduction in Area: 97.1% % Reduction in Volume: 98.6% Epithelialization: None 1 2 Wound Description Classification: Category/Stage IV Exudate Amount: Medium Exudate Type: Serosanguineous Exudate Color: red, brown Foul Odor After Cleansing: No Slough/Fibrino No Wound Bed Granulation  Amount: None Present (0%) Exposed Structure Necrotic Amount: Large (67-100%) Fascia Exposed: No Necrotic Quality: Eschar Fat Layer (Subcutaneous Tissue) Exposed: Yes Tendon Exposed: No Muscle Exposed:  No Joint Exposed: No Bone Exposed: No Treatment Notes Wound #1 (Calcaneus) Wound Laterality: Right Cleanser Soap and Water Discharge Instruction: Use as directed. [name], [name] ([id number]) [id number] Page 8 of 8 Peri-Wound Care AandD Ointment Discharge Instruction: Apply AandD Ointment as directed Topical Primary Dressing Hydrofera Blue Ready Transfer Foam, 2.5x2.5 (in/in) Discharge Instruction: Apply Hydrofera Blue Ready to wound bed as directed Secondary Dressing ABD Pad 5x9 (in/in) Discharge Instruction: Cover with ABD pad Secured With Kerlix Roll Sterile or Non-Sterile 6-ply 4.5x4 (yd/yd) Discharge Instruction: Apply Kerlix as directed Compression Wrap Compression Stockings Add-Ons Electronic Signature(s) Signed: 09/07/2023 4:17:14 PM By: Midge Aver MSN RN CNS WTA Entered By: Midge Aver on 09/06/2023 05:44:38 -------------------------------------------------------------------------------- Vitals Details Patient Name: Date of Service: [name], Robert [name]. 09/06/2023 8:30 A M Medical Record Number: [id number] Patient Account Number: [id number] Date of Birth/Sex: Treating RN: [birth year] (57 y.o. [name] Primary Care Andriy Sherk: Eleanora Neighbor Other Clinician: Referring Fabian Walder: Treating Jarret Torre/Extender: Brandt Loosen in Treatment: 14 Vital Signs Time Taken: 08:38 Temperature (F): 98.4 Height (in): 73 Pulse (bpm): 68 Weight (lbs): 205 Respiratory Rate (breaths/min): 18 Body Mass Index (BMI): 27 Blood Pressure (mmHg): 143/94 Reference Range: 80 - 120 mg / dl Electronic Signature(s) Signed: 09/07/2023 4:17:14 PM By: Midge Aver MSN RN CNS WTA Entered By: Midge Aver on 09/06/2023 05:39:20

## 2023-09-13 ENCOUNTER — Encounter: Payer: Managed Care, Other (non HMO) | Admitting: Physician Assistant

## 2023-09-13 DIAGNOSIS — L89610 Pressure ulcer of right heel, unstageable: Secondary | ICD-10-CM | POA: Diagnosis not present

## 2023-09-13 NOTE — Progress Notes (Signed)
[name], [name] ([id number]) [id number] Page 1 of 8 Visit Report for 09/13/2023 Chief Complaint Document Details Patient Name: Date of Service: Robert [place] T. 09/13/2023 8:30 A M Medical Record Number: [id number] Patient Account Number: [id number] Date of Birth/Sex: Treating RN: 10-01-1965 (57 y.o. [name] Primary Care Provider: Eleanora Neighbor Other Clinician: Referring Provider: Treating Provider/Extender: Brandt Loosen in Treatment: 15 Information Obtained from: Patient Chief Complaint Right heel ulcer Electronic Signature(s) Signed: 09/13/2023 8:52:31 AM By: Allen Derry PA-C Entered By: Allen Derry on 09/13/2023 08:52:31 -------------------------------------------------------------------------------- Debridement Details Patient Name: Date of Service: [name], [name] 09/13/2023 8:30 A M Medical Record Number: [id number] Patient Account Number: [id number] Date of Birth/Sex: Treating RN: 06-Jun-1966 (57 y.o. [name] Primary Care Provider: Eleanora Neighbor Other Clinician: Referring Provider: Treating Provider/Extender: Brandt Loosen in Treatment: 15 Debridement Performed for Assessment: Wound #1 Right Calcaneus Performed By: Physician Allen Derry, PA-C The following information was scribed by: Midge Aver The information was scribed for: Allen Derry Debridement Type: Debridement Level of Consciousness (Pre-procedure): Awake and Alert Pre-procedure Verification/Time Out Yes - 08:54 Taken: Start Time: 08:54 Pain Control: Lidocaine 4% T opical Solution Percent of Wound Bed Debrided: 100% T Area Debrided (cm): otal 0.63 Tissue and other material debrided: Viable, Non-Viable, Callus, Slough, Subcutaneous, Slough Level: Skin/Subcutaneous Tissue Debridement Description: Excisional Instrument: Curette Bleeding: Minimum Hemostasis Achieved: Pressure BANNON, MIDYETTE T  ([id number]) [id number] Page 2 of 8 Procedural Pain: 0 Post Procedural Pain: 0 Response to Treatment: Procedure was tolerated well Level of Consciousness (Post- Awake and Alert procedure): Post Debridement Measurements of Total Wound Length: (cm) 0.8 Stage: Category/Stage IV Width: (cm) 1 Depth: (cm) 0.1 Volume: (cm) 0.063 Character of Wound/Ulcer Post Debridement: Stable Post Procedure Diagnosis Same as Pre-procedure Electronic Signature(s) Signed: 09/13/2023 5:11:14 PM By: Allen Derry PA-C Signed: 09/13/2023 5:12:14 PM By: Midge Aver MSN RN CNS WTA Entered By: Midge Aver on 09/13/2023 08:55:31 -------------------------------------------------------------------------------- HPI Details Patient Name: Date of Service: [name], [name] 09/13/2023 8:30 A M Medical Record Number: [id number] Patient Account Number: [id number] Date of Birth/Sex: Treating RN: 1966/04/02 (57 y.o. [name] Primary Care Provider: Eleanora Neighbor Other Clinician: Referring Provider: Treating Provider/Extender: Brandt Loosen in Treatment: 15 History of Present Illness HPI Description: 05-31-2023 patient presents today for initial evaluation here in the clinic concerning issues that he is having with an eschar over the right heel. Unfortunately this came on seemingly fairly suddenly when he was at work. He does work around [employer] but states he was not doing anything directly with them at that point. Again I am unsure exactly what went on here and to what degree the chemicals could have played a role in this but this almost looks to me more like damage secondary to a chemical burn versus a strict pressure ulcer although I am unsure to be certain. The issue here is simply that he has 3 areas that are somewhat separated by some skin in between although to some degree connected as well which is very necrotic and eschar covered and to be  honest getting any dressing to this is not really going to be of benefit at this point. Fortunately I do not see any signs of active infection at this time which is good news. With that being said I do think that this is gena be difficult to get heal until we get the eschar off of the area. The patient voiced  understanding and his wife was present during the evaluation today as well. He does have hypertension but no other major medical problems. T my knowledge this is not a workers o comp case although I am unsure whether or not it should be to be honest. 06-08-2023 upon evaluation today patient appears to be doing well currently in regard to his wound. He has been tolerating the dressing changes without complication. Fortunately there does not appear to be any signs of active infection locally or systemically which is great news. With that being said he still has a pretty much eschar covering he did get the Santyl after a bit of a fight although he only got 2 tubes this may not last a full month but hopefully we will need it the whole month and this will work out just fine for [place] will make do with what we can get. 06-15-2023 upon evaluation today patient's wound is actually starting to soften up as far as the eschar is concerned actually think we may be able to get some of that off today and I discussed that with the patient. He is in agreement with given the a shot to do this. 06-22-2023 upon evaluation today patient appears to be doing much better in regard to his heel actually feel like most of the wounds are actually making significant improvement here which is great news and are very pleased in that regard. I do not see any signs of active infection looking or systemically at this time which is great news and in general I do believe that we are making good headway towards complete closure. 10-7 24 upon evaluation today patient appears to be doing well currently in regard to his wound. Again the  heel is showing signs of excellent improvement and actually very pleased with where we stand I do believe that he is doing quite well currently. 07-05-2023 upon evaluation today patient appears to be doing well currently in regard to his wound. He is actually showing signs of improvement which is great news and in general I do believe that we are making headway towards complete closure which is great news. I think that he is doing well currently with the Iodoflex. 07-12-2023 upon evaluation patient's wound bed actually showed signs of good granulation epithelization at this point. He is tolerating dressing changes without complication and good news is I feel like they were moving in the right direction here. I do not see any signs of active infection at this time. 07-19-2023 upon evaluation today patient appears to be doing very well in regard to his wound the heel actually showing signs of dramatic improvement at this SEVERO, BRIAN ([id number]) [id number] Page 3 of 8 time. There does not appear to be any signs of active infection which is good news. No fevers, chills, nausea, vomiting, or diarrhea. 08-02-2023 upon evaluation today patient appears to be doing well currently in regard to his wound. He has been tolerating the dressing changes without complication. Fortunately there does not appear to be any signs of active infection locally or systemically which is great news and in general I think that we are moving in the right direction here. There is some biofilm noted on the surface of the wound. 08-09-2023 upon evaluation today patient appears to be doing well currently in regard to his wound. He has been tolerating the dressing changes without complication. Fortunately I do not see any signs of active infection locally or systemically at this time which is great news.  No fevers, chills, nausea, vomiting, or diarrhea. 08-16-2023 upon evaluation today patient appears  to be doing well currently in regard to his heel ulcer. He has been tolerating the dressing changes without complication. Fortunately I do not see any signs of active infection locally or systemically which is great news. 08-30-2023 upon evaluation today patient appears to be doing excellent currently in regard to his heel. He has been tolerating the dressing changes without complication. With that being said I think his heel is making excellent progress here. 09-06-2023 upon evaluation today patient's wound is actually showing signs of excellent improvement I am very pleased with where things stand today. Fortunately I do not see any evidence of active infection at this time which is great news and in general I do believe that we are making really good headway here towards closure which is great news. 09-13-2023 upon evaluation today patient appears to be doing excellent in regard to his wounds. He has been tolerating the dressing changes without complication. Fortunately there does not appear to be any signs of active infection at this time. Electronic Signature(s) Signed: 09/13/2023 9:05:14 AM By: Allen Derry PA-C Entered By: Allen Derry on 09/13/2023 09:05:14 -------------------------------------------------------------------------------- Physical Exam Details Patient Name: Date of Service: [name], LLO [place] T. 09/13/2023 8:30 A M Medical Record Number: [id number] Patient Account Number: [id number] Date of Birth/Sex: Treating RN: 05-26-1966 (57 y.o. [name] Primary Care Provider: Eleanora Neighbor Other Clinician: Referring Provider: Treating Provider/Extender: Brandt Loosen in Treatment: 15 Constitutional Well-nourished and well-hydrated in no acute distress. Respiratory normal breathing without difficulty. Psychiatric this patient is able to make decisions and demonstrates good insight into disease process. Alert and Oriented x 3. pleasant and  cooperative. Notes Upon inspection patient's wound bed actually showed signs of excellent granulation epithelization there is just a very small amount of some slough and biofilm noted on the surface of the wound and overall he seems to be making excellent progress towards healing which is great news. Electronic Signature(s) Signed: 09/13/2023 9:05:26 AM By: Allen Derry PA-C Entered By: Allen Derry on 09/13/2023 09:05:26 [name] ([id number]) [id number] Page 4 of 8 -------------------------------------------------------------------------------- Physician Orders Details Patient Name: Date of Service: Robert Cleveland T. 09/13/2023 8:30 A M Medical Record Number: [id number] Patient Account Number: [id number] Date of Birth/Sex: Treating RN: Jan 29, 1966 (57 y.o. [name] Primary Care Provider: Eleanora Neighbor Other Clinician: Referring Provider: Treating Provider/Extender: Brandt Loosen in Treatment: 15 The following information was scribed by: Midge Aver The information was scribed for: Allen Derry Verbal / Phone Orders: No Diagnosis Coding ICD-10 Coding Code Description L89.610 Pressure ulcer of right heel, unstageable I10 Essential (primary) hypertension Follow-up Appointments Return Appointment in 1 week. Nurse Visit as needed Bathing/ Shower/ Hygiene May shower; gently cleanse wound with antibacterial soap, rinse and pat dry prior to dressing wounds Anesthetic (Use 'Patient Medications' Section for Anesthetic Order Entry) Lidocaine applied to wound bed Wound Treatment Wound #1 - Calcaneus Wound Laterality: Right Cleanser: Soap and Water 3 x Per Week/30 Days Discharge Instructions: Use as directed. Peri-Wound Care: AandD Ointment 3 x Per Week/30 Days Discharge Instructions: Apply AandD Ointment as directed Prim Dressing: Hydrofera Blue Ready Transfer Foam, 2.5x2.5 (in/in) (Generic) 3 x Per Week/30  Days ary Discharge Instructions: Apply Hydrofera Blue Ready to wound bed as directed Secondary Dressing: ABD Pad 5x9 (in/in) (Generic) 3 x Per Week/30 Days Discharge Instructions: Cover with ABD pad Secured With: [employer] or  Non-Sterile 6-ply 4.5x4 (yd/yd) (Generic) 3 x Per Week/30 Days Discharge Instructions: Apply Kerlix as directed Electronic Signature(s) Signed: 09/13/2023 5:11:14 PM By: Allen Derry PA-C Signed: 09/13/2023 5:12:14 PM By: Midge Aver MSN RN CNS WTA Entered By: Midge Aver on 09/13/2023 08:56:09 [name] ([id number]) [id number] Page 5 of 8 -------------------------------------------------------------------------------- Problem List Details Patient Name: Date of Service: Robert Cleveland T. 09/13/2023 8:30 A M Medical Record Number: [id number] Patient Account Number: [id number] Date of Birth/Sex: Treating RN: 1966/05/12 (57 y.o. [name] Primary Care Provider: Eleanora Neighbor Other Clinician: Referring Provider: Treating Provider/Extender: Brandt Loosen in Treatment: 15 Active Problems ICD-10 Encounter Code Description Active Date MDM Diagnosis L89.610 Pressure ulcer of right heel, unstageable 05/31/2023 No Yes I10 Essential (primary) hypertension 05/31/2023 No Yes Inactive Problems Resolved Problems Electronic Signature(s) Signed: 09/13/2023 8:52:28 AM By: Allen Derry PA-C Entered By: Allen Derry on 09/13/2023 08:52:28 -------------------------------------------------------------------------------- Progress Note Details Patient Name: Date of Service: [name], [name] 09/13/2023 8:30 A M Medical Record Number: [id number] Patient Account Number: [id number] Date of Birth/Sex: Treating RN: [birth year] (57 y.o. [name] Primary Care Provider: Eleanora Neighbor Other Clinician: Referring Provider: Treating Provider/Extender: Brandt Loosen in  Treatment: 15 Subjective Chief Complaint Information obtained from Patient Right heel ulcer History of Present Illness (HPI) 05-31-2023 patient presents today for initial evaluation here in the clinic concerning issues that he is having with an eschar over the right heel. Unfortunately this YVAN, JUHNKE ([id number]) [id number] Page 6 of 8 came on seemingly fairly suddenly when he was at work. He does work around [employer] but states he was not doing anything directly with them at that point. Again I am unsure exactly what went on here and to what degree the chemicals could have played a role in this but this almost looks to me more like damage secondary to a chemical burn versus a strict pressure ulcer although I am unsure to be certain. The issue here is simply that he has 3 areas that are somewhat separated by some skin in between although to some degree connected as well which is very necrotic and eschar covered and to be honest getting any dressing to this is not really going to be of benefit at this point. Fortunately I do not see any signs of active infection at this time which is good news. With that being said I do think that this is gena be difficult to get heal until we get the eschar off of the area. The patient voiced understanding and his wife was present during the evaluation today as well. He does have hypertension but no other major medical problems. T my knowledge this is not a workers comp o case although I am unsure whether or not it should be to be honest. 06-08-2023 upon evaluation today patient appears to be doing well currently in regard to his wound. He has been tolerating the dressing changes without complication. Fortunately there does not appear to be any signs of active infection locally or systemically which is great news. With that being said he still has a pretty much eschar covering he did get the Santyl after a bit of a fight although he  only got 2 tubes this may not last a full month but hopefully we will need it the whole month and this will work out just fine for [place] will make do with what we can get. 06-15-2023 upon evaluation today patient's wound is  actually starting to soften up as far as the eschar is concerned actually think we may be able to get some of that off today and I discussed that with the patient. He is in agreement with given the a shot to do this. 06-22-2023 upon evaluation today patient appears to be doing much better in regard to his heel actually feel like most of the wounds are actually making significant improvement here which is great news and are very pleased in that regard. I do not see any signs of active infection looking or systemically at this time which is great news and in general I do believe that we are making good headway towards complete closure. 10-7 24 upon evaluation today patient appears to be doing well currently in regard to his wound. Again the heel is showing signs of excellent improvement and actually very pleased with where we stand I do believe that he is doing quite well currently. 07-05-2023 upon evaluation today patient appears to be doing well currently in regard to his wound. He is actually showing signs of improvement which is great news and in general I do believe that we are making headway towards complete closure which is great news. I think that he is doing well currently with the Iodoflex. 07-12-2023 upon evaluation patient's wound bed actually showed signs of good granulation epithelization at this point. He is tolerating dressing changes without complication and good news is I feel like they were moving in the right direction here. I do not see any signs of active infection at this time. 07-19-2023 upon evaluation today patient appears to be doing very well in regard to his wound the heel actually showing signs of dramatic improvement at this time. There does not appear to be  any signs of active infection which is good news. No fevers, chills, nausea, vomiting, or diarrhea. 08-02-2023 upon evaluation today patient appears to be doing well currently in regard to his wound. He has been tolerating the dressing changes without complication. Fortunately there does not appear to be any signs of active infection locally or systemically which is great news and in general I think that we are moving in the right direction here. There is some biofilm noted on the surface of the wound. 08-09-2023 upon evaluation today patient appears to be doing well currently in regard to his wound. He has been tolerating the dressing changes without complication. Fortunately I do not see any signs of active infection locally or systemically at this time which is great news. No fevers, chills, nausea, vomiting, or diarrhea. 08-16-2023 upon evaluation today patient appears to be doing well currently in regard to his heel ulcer. He has been tolerating the dressing changes without complication. Fortunately I do not see any signs of active infection locally or systemically which is great news. 08-30-2023 upon evaluation today patient appears to be doing excellent currently in regard to his heel. He has been tolerating the dressing changes without complication. With that being said I think his heel is making excellent progress here. 09-06-2023 upon evaluation today patient's wound is actually showing signs of excellent improvement I am very pleased with where things stand today. Fortunately I do not see any evidence of active infection at this time which is great news and in general I do believe that we are making really good headway here towards closure which is great news. 09-13-2023 upon evaluation today patient appears to be doing excellent in regard to his wounds. He has been tolerating the dressing  changes without complication. Fortunately there does not appear to be any signs of active infection at  this time. Objective Constitutional Well-nourished and well-hydrated in no acute distress. Vitals Time Taken: 8:42 AM, Height: 73 in, Weight: 205 lbs, BMI: 27, Temperature: 98.2 F, Pulse: 64 bpm, Respiratory Rate: 18 breaths/min, Blood Pressure: 145/90 mmHg. Respiratory normal breathing without difficulty. Psychiatric this patient is able to make decisions and demonstrates good insight into disease process. Alert and Oriented x 3. pleasant and cooperative. General Notes: Upon inspection patient's wound bed actually showed signs of excellent granulation epithelization there is just a very small amount of some slough and biofilm noted on the surface of the wound and overall he seems to be making excellent progress towards healing which is great news. Integumentary (Hair, Skin) Wound #1 status is Open. Original cause of wound was Gradually Appeared. The date acquired was: 05/20/2023. The wound has been in treatment 15 weeks. The wound is located on the Right Calcaneus. The wound measures 0.8cm length x 1cm width x 0.1cm depth; 0.628cm^2 area and 0.063cm^3 volume. There is Fat Layer (Subcutaneous Tissue) exposed. There is a medium amount of serosanguineous drainage noted. There is no granulation within the wound bed. There is a large (67-100%) amount of necrotic tissue within the wound bed including Eschar. LOUI, ROLNICK ([id number]) [id number] Page 7 of 8 Assessment Active Problems ICD-10 Pressure ulcer of right heel, unstageable Essential (primary) hypertension Procedures Wound #1 Pre-procedure diagnosis of Wound #1 is a Pressure Ulcer located on the Right Calcaneus . There was a Excisional Skin/Subcutaneous Tissue Debridement with a total area of 0.63 sq cm performed by Allen Derry, PA-C. With the following instrument(s): Curette to remove Viable and Non-Viable tissue/material. Material removed includes Callus, Subcutaneous Tissue, and Slough after achieving  pain control using Lidocaine 4% T opical Solution. No specimens were taken. A time out was conducted at 08:54, prior to the start of the procedure. A Minimum amount of bleeding was controlled with Pressure. The procedure was tolerated well with a pain level of 0 throughout and a pain level of 0 following the procedure. Post Debridement Measurements: 0.8cm length x 1cm width x 0.1cm depth; 0.063cm^3 volume. Post debridement Stage noted as Category/Stage IV. Character of Wound/Ulcer Post Debridement is stable. Post procedure Diagnosis Wound #1: Same as Pre-Procedure Plan Follow-up Appointments: Return Appointment in 1 week. Nurse Visit as needed Bathing/ Shower/ Hygiene: May shower; gently cleanse wound with antibacterial soap, rinse and pat dry prior to dressing wounds Anesthetic (Use 'Patient Medications' Section for Anesthetic Order Entry): Lidocaine applied to wound bed WOUND #1: - Calcaneus Wound Laterality: Right Cleanser: Soap and Water 3 x Per Week/30 Days Discharge Instructions: Use as directed. Peri-Wound Care: AandD Ointment 3 x Per Week/30 Days Discharge Instructions: Apply AandD Ointment as directed Prim Dressing: Hydrofera Blue Ready Transfer Foam, 2.5x2.5 (in/in) (Generic) 3 x Per Week/30 Days ary Discharge Instructions: Apply Hydrofera Blue Ready to wound bed as directed Secondary Dressing: ABD Pad 5x9 (in/in) (Generic) 3 x Per Week/30 Days Discharge Instructions: Cover with ABD pad Secured With: Kerlix Roll Sterile or Non-Sterile 6-ply 4.5x4 (yd/yd) (Generic) 3 x Per Week/30 Days Discharge Instructions: Apply Kerlix as directed 1. I would recommend based on what I see that we have the patient going to continue with the [facility] which I think is doing a good job at this time. 2. I am also can recommend continue with the ABD pad to cover and roll gauze to secure in place. We will see patient  back for reevaluation in 1 week here in the clinic. If anything worsens or  changes patient will contact our office for additional recommendations. Electronic Signature(s) Signed: 09/13/2023 9:05:50 AM By: Allen Derry PA-C Entered By: Allen Derry on 09/13/2023 09:05:49 -------------------------------------------------------------------------------- SuperBill Details Patient Name: Date of Service: [name], [name] 09/13/2023 Medical Record Number: [id number] Patient Account Number: [id number] HILL, UNDERDAHL ([id number]) [id number] Page 8 of 8 Date of Birth/Sex: Treating RN: Apr 26, 1966 (57 y.o. [name] Primary Care Provider: Eleanora Neighbor Other Clinician: Referring Provider: Treating Provider/Extender: Brandt Loosen in Treatment: 15 Diagnosis Coding ICD-10 Codes Code Description L89.610 Pressure ulcer of right heel, unstageable I10 Essential (primary) hypertension Facility Procedures : CPT4 Code: [id number] Description: 11042 - DEB SUBQ TISSUE 20 SQ CM/< ICD-10 Diagnosis Description L89.610 Pressure ulcer of right heel, unstageable Modifier: Quantity: 1 Physician Procedures : CPT4 Code Description Modifier 11042 11042 - WC PHYS SUBQ TISS 20 SQ CM ICD-10 Diagnosis Description L89.610 Pressure ulcer of right heel, unstageable Quantity: 1 Electronic Signature(s) Signed: 09/13/2023 9:06:21 AM By: Allen Derry PA-C Entered By: Allen Derry on 09/13/2023 09:06:21

## 2023-09-14 NOTE — Progress Notes (Signed)
MARTINJR, LUCKER [name] ([id number]) [id number] Page 1 of 8 Visit Report for 09/13/2023 Arrival Information Details Patient Name: Date of Service: Robert Lav [place] [name]. 09/13/2023 8:30 A M Medical Record Number: [id number] Patient Account Number: [id number] Date of Birth/Sex: Treating RN: 08-Apr-1966 (57 y.o. [name] Primary Care Tiger Spieker: [name] Other Clinician: Referring Evvie Behrmann: Treating [name]/Extender: [name]: 15 Visit Information History Since Last Visit Added or deleted any medications: No Patient Arrived: Ambulatory Any new allergies or adverse reactions: No Arrival Time: 08:41 Had a fall or experienced change in No Accompanied By: self activities of daily living that may affect Transfer Assistance: None risk of falls: Patient Requires Transmission-Based Precautions: No Hospitalized since last visit: No Patient Has Alerts: Yes Has Dressing in Place as Prescribed: Yes Patient Alerts: Not diabetic Pain Present Now: No Electronic Signature(s) Signed: 09/13/2023 5:12:14 PM By: Midge Aver MSN RN CNS WTA Entered By: Midge Aver on 09/13/2023 08:42:10 -------------------------------------------------------------------------------- Clinic Level of Care Assessment Details Patient Name: Date of Service: [name], LLO [place] [name]. 09/13/2023 8:30 A M Medical Record Number: [id number] Patient Account Number: [id number] Date of Birth/Sex: Treating RN: [birth year] (58 y.o. [name] Primary Care [name]: [name] Other Clinician: Referring Irby Fails: Treating Jerelle Virden/Extender: [name]: 15 Clinic Level of Care Assessment Items TOOL 1 Quantity Score []  - 0 Use when EandM and Procedure is performed on INITIAL visit ASSESSMENTS - Nursing Assessment / Reassessment []  - 0 General Physical Exam (combine w/ comprehensive assessment (listed just  below) when performed on new pt. evals) []  - 0 Comprehensive Assessment (HX, ROS, Risk Assessments, Wounds Hx, etc.) ASSESSMENTS - Wound and Skin Assessment / Reassessment []  - 0 Dermatologic / Skin Assessment (not related to wound area) ASSESSMENTS - Ostomy and/or Continence Assessment and Care ADOLFO, BRABAND [name] ([id number]) [id number] Page 2 of 8 []  - 0 Incontinence Assessment and Management []  - 0 Ostomy Care Assessment and Management (repouching, etc.) PROCESS - Coordination of Care []  - 0 Simple Patient / Family Education for ongoing care []  - 0 Complex (extensive) Patient / Family Education for ongoing care []  - 0 Staff obtains Chiropractor, Records, [name] Results / Process Orders est []  - 0 Staff telephones HHA, Nursing Homes / Clarify orders / etc []  - 0 Routine Transfer to another Facility (non-emergent condition) []  - 0 Routine Hospital Admission (non-emergent condition) []  - 0 New Admissions / Manufacturing engineer / Ordering NPWT Apligraf, etc. , []  - 0 Emergency Hospital Admission (emergent condition) PROCESS - Special Needs []  - 0 Pediatric / Minor Patient Management []  - 0 Isolation Patient Management []  - 0 Hearing / Language / Visual special needs []  - 0 Assessment of Community assistance (transportation, D/C planning, etc.) []  - 0 Additional assistance / Altered mentation []  - 0 Support Surface(s) Assessment (bed, cushion, seat, etc.) INTERVENTIONS - Miscellaneous []  - 0 External ear exam []  - 0 Patient Transfer (multiple staff / Nurse, adult / Similar devices) []  - 0 Simple Staple / Suture removal (25 or less) []  - 0 Complex Staple / Suture removal (26 or more) []  - 0 Hypo/Hyperglycemic Management (do not check if billed separately) []  - 0 Ankle / Brachial Index (ABI) - do not check if billed separately Has the patient been seen at the hospital within the last three years: Yes Total Score: 0 Level Of Care: ____ Electronic  Signature(s) Signed: 09/13/2023 5:12:14 PM By: Midge Aver MSN RN CNS WTA Entered By:  Midge Aver on 09/13/2023 08:56:20 -------------------------------------------------------------------------------- Encounter Discharge Information Details Patient Name: Date of Service: Robert [place] [name]. 09/13/2023 8:30 A M Medical Record Number: [id number] Patient Account Number: [id number] Date of Birth/Sex: Treating RN: [birth year] (57 y.o. [name] Primary Care Adelee Hannula: [name] Other Clinician: Referring [name]: Treating [name]/Extender: [name]: 15 Encounter Discharge Information Items Post Procedure Vitals Discharge Condition: Stable Temperature (F): 98.2 BIRGER, SYLVAIN [name] ([id number]) [id number] Page 3 of 8 Ambulatory Status: Ambulatory Pulse (bpm): 64 Discharge Destination: Home Respiratory Rate (breaths/min): 18 Transportation: Private Auto Blood Pressure (mmHg): 145/90 Accompanied By: self Schedule Follow-up Appointment: Yes Clinical Summary of Care: Electronic Signature(s) Signed: 09/13/2023 5:12:14 PM By: Midge Aver MSN RN CNS WTA Entered By: Midge Aver on 09/13/2023 08:57:47 -------------------------------------------------------------------------------- Lower Extremity Assessment Details Patient Name: Date of Service: [name], [name]. 09/13/2023 8:30 A M Medical Record Number: [id number] Patient Account Number: [id number] Date of Birth/Sex: Treating RN: 1966/07/24 (57 y.o. [name] Primary Care [name]: [name] Other Clinician: Referring [name]: Treating [name]/Extender: [name]: 15 Electronic Signature(s) Signed: 09/13/2023 5:12:14 PM By: Midge Aver MSN RN CNS WTA Entered By: Midge Aver on 09/13/2023 08:42:55 -------------------------------------------------------------------------------- Multi Wound  Chart Details Patient Name: Date of Service: [name], [name]. 09/13/2023 8:30 A M Medical Record Number: [id number] Patient Account Number: [id number] Date of Birth/Sex: Treating RN: 08/06/1966 (57 y.o. [name] Primary Care [name]: [name] Other Clinician: Referring Sevrin Sally: Treating Margreat Widener/Extender: [name]: 15 Vital Signs Height(in): 73 Pulse(bpm): 64 Weight(lbs): 205 Blood Pressure(mmHg): 145/90 Body Mass Index(BMI): 27 Temperature(F): 98.2 Respiratory Rate(breaths/min): 18 [1:Photos:] [N/A:N/A] Right Calcaneus N/A N/A Wound Location: Gradually Appeared N/A N/A Wounding Event: Pressure Ulcer N/A N/A Primary Etiology: Hypertension N/A N/A Comorbid History: 05/20/2023 N/A N/A Date Acquired: 15 N/A N/A Weeks of Treatment: Open N/A N/A Wound Status: No N/A N/A Wound Recurrence: Yes N/A N/A Clustered Wound: 0.8x1x0.1 N/A N/A Measurements L x W x D (cm) 0.628 N/A N/A A (cm) : rea 0.063 N/A N/A Volume (cm) : 98.20% N/A N/A % Reduction in A rea: 99.10% N/A N/A % Reduction in Volume: Category/Stage IV N/A N/A Classification: Medium N/A N/A Exudate A mount: Serosanguineous N/A N/A Exudate Type: red, brown N/A N/A Exudate Color: None Present (0%) N/A N/A Granulation A mount: Large (67-100%) N/A N/A Necrotic A mount: Eschar N/A N/A Necrotic Tissue: Fat Layer (Subcutaneous Tissue): Yes N/A N/A Exposed Structures: Fascia: No Tendon: No Muscle: No Joint: No Bone: No None N/A N/A Epithelialization: Treatment Notes Electronic Signature(s) Signed: 09/13/2023 5:12:14 PM By: Midge Aver MSN RN CNS WTA Entered By: Midge Aver on 09/13/2023 08:44:11 -------------------------------------------------------------------------------- Multi-Disciplinary Care Plan Details Patient Name: Date of Service: [name], [name]. 09/13/2023 8:30 A M Medical Record Number: [id number] Patient Account  Number: [id number] Date of Birth/Sex: Treating RN: 01-14-1966 (57 y.o. [name] Primary Care Denard Tuminello: [name] Other Clinician: Referring Analiza Cowger: Treating Cletus Mehlhoff/Extender: [name]: 15 Active Inactive Necrotic Tissue Nursing Diagnoses: Impaired tissue integrity related to necrotic/devitalized tissue Knowledge deficit related to management of necrotic/devitalized tissue Goals: Necrotic/devitalized tissue will be minimized in the wound bed Date Initiated: 05/31/2023 Date Inactivated: 06/28/2023 Target Resolution Date: 06/30/2023 Goal Status: KEANON, VETRANO ([id number]) [id number] Page 5 of 8 Patient/caregiver will verbalize understanding of reason and process for debridement of necrotic tissue Date Initiated: 05/31/2023 Target Resolution Date:  09/20/2023 Goal Status: Active Interventions: Assess patient pain level pre-, during and post procedure and prior to discharge Provide education on necrotic tissue and debridement process Treatment Activities: Apply topical anesthetic as ordered : 05/31/2023 Enzymatic debridement : 05/31/2023 Notes: Wound/Skin Impairment Nursing Diagnoses: Impaired tissue integrity Knowledge deficit related to ulceration/compromised skin integrity Goals: Patient/caregiver will verbalize understanding of skin care regimen Date Initiated: 05/31/2023 Date Inactivated: 06/28/2023 Target Resolution Date: 06/30/2023 Goal Status: Met Ulcer/skin breakdown will have a volume reduction of 30% by week 4 Date Initiated: 05/31/2023 Date Inactivated: 06/28/2023 Target Resolution Date: 06/30/2023 Goal Status: Met Ulcer/skin breakdown will have a volume reduction of 50% by week 8 Date Initiated: 05/31/2023 Date Inactivated: 08/02/2023 Target Resolution Date: 07/31/2023 Goal Status: Met Ulcer/skin breakdown will have a volume reduction of 80% by week 12 Date Initiated: 05/31/2023 Date  Inactivated: 08/30/2023 Target Resolution Date: 08/30/2023 Goal Status: Met Ulcer/skin breakdown will heal within 14 weeks Date Initiated: 05/31/2023 Target Resolution Date: 09/21/2023 Goal Status: Active Interventions: Assess patient/caregiver ability to obtain necessary supplies Assess patient/caregiver ability to perform ulcer/skin care regimen upon admission and as needed Assess ulceration(s) every visit Provide education on ulcer and skin care Notes: Electronic Signature(s) Signed: 09/13/2023 5:12:14 PM By: Midge Aver MSN RN CNS WTA Entered By: Midge Aver on 09/13/2023 08:56:37 -------------------------------------------------------------------------------- Pain Assessment Details Patient Name: Date of Service: [name], [name]. 09/13/2023 8:30 A M Medical Record Number: [id number] Patient Account Number: [id number] Date of Birth/Sex: Treating RN: 10/31/1965 (57 y.o. [name] Primary Care Fard Borunda: [name] Other Clinician: Referring Kona Lover: Treating Jaquala Fuller/Extender: [name]: 15 Active Problems Location of Pain Severity and Description of Pain Patient Has Paino No KAUA, EARNST [name] ([id number]) [id number] Page 6 of 8 Patient Has Paino No Site Locations Pain Management and Medication Current Pain Management: Electronic Signature(s) Signed: 09/13/2023 5:12:14 PM By: Midge Aver MSN RN CNS WTA Entered By: Midge Aver on 09/13/2023 08:42:42 -------------------------------------------------------------------------------- Patient/Caregiver Education Details Patient Name: Date of Service: [name], [name]. 12/23/2024andnbsp8:30 A M Medical Record Number: [id number] Patient Account Number: [id number] Date of Birth/Gender: Treating RN: 10/20/1965 (57 y.o. [name] Primary Care Physician: [name] Other Clinician: Referring Physician: Treating Physician/Extender:  [name]: 15 Education Assessment Education Provided To: Patient Education Topics Provided Wound Debridement: Handouts: Wound Debridement Methods: Explain/Verbal Responses: State content correctly Wound/Skin Impairment: Handouts: Caring for Your Ulcer Methods: Explain/Verbal Responses: State content correctly Electronic Signature(s) Signed: 09/13/2023 5:12:14 PM By: Midge Aver MSN RN CNS WTA Entered By: Midge Aver on 09/13/2023 08:56:55 [name] ([id number]) [id number] Page 7 of 8 -------------------------------------------------------------------------------- Wound Assessment Details Patient Name: Date of Service: Robert [place] [name]. 09/13/2023 8:30 A M Medical Record Number: [id number] Patient Account Number: [id number] Date of Birth/Sex: Treating RN: 1965/12/25 (57 y.o. [name] Primary Care Curtis Uriarte: [name] Other Clinician: Referring Valecia Beske: Treating Jiraiya Mcewan/Extender: [name]: 15 Wound Status Wound Number: 1 Primary Etiology: Pressure Ulcer Wound Location: Right Calcaneus Wound Status: Open Wounding Event: Gradually Appeared Comorbid History: Hypertension Date Acquired: 05/20/2023 Weeks Of Treatment: 15 Clustered Wound: Yes Photos Wound Measurements Length: (cm) 0.8 Width: (cm) 1 Depth: (cm) 0.1 Area: (cm) 0.62 Volume: (cm) 0.06 % Reduction in Area: 98.2% % Reduction in Volume: 99.1% Epithelialization: None 8 3 Wound Description Classification: Category/Stage IV Exudate Amount: Medium Exudate Type: Serosanguineous Exudate Color: red, brown Foul Odor After Cleansing: No Slough/Fibrino No Wound Bed  Granulation Amount: None Present (0%) Exposed Structure Necrotic Amount: Large (67-100%) Fascia Exposed: No Necrotic Quality: Eschar Fat Layer (Subcutaneous Tissue) Exposed: Yes Tendon Exposed: No Muscle Exposed:  No Joint Exposed: No Bone Exposed: No Treatment Notes Wound #1 (Calcaneus) Wound Laterality: Right Cleanser Soap and Water Discharge Instruction: Use as directed. DREXEL, VELARDO [name] ([id number]) [id number] Page 8 of 8 Peri-Wound Care AandD Ointment Discharge Instruction: Apply AandD Ointment as directed Topical Primary Dressing Hydrofera Blue Ready Transfer Foam, 2.5x2.5 (in/in) Discharge Instruction: Apply Hydrofera Blue Ready to wound bed as directed Secondary Dressing ABD Pad 5x9 (in/in) Discharge Instruction: Cover with ABD pad Secured With Kerlix Roll Sterile or Non-Sterile 6-ply 4.5x4 (yd/yd) Discharge Instruction: Apply Kerlix as directed Compression Wrap Compression Stockings Add-Ons Electronic Signature(s) Signed: 09/13/2023 5:12:14 PM By: Midge Aver MSN RN CNS WTA Entered By: Midge Aver on 09/13/2023 08:41:46 -------------------------------------------------------------------------------- Vitals Details Patient Name: Date of Service: [name], [name]. 09/13/2023 8:30 A M Medical Record Number: [id number] Patient Account Number: [id number] Date of Birth/Sex: Treating RN: 07-28-1966 (57 y.o. [name] Primary Care Airon Sahni: [name] Other Clinician: Referring Sadee Osland: Treating Renne Platts/Extender: [name]: 15 Vital Signs Time Taken: 08:42 Temperature (F): 98.2 Height (in): 73 Pulse (bpm): 64 Weight (lbs): 205 Respiratory Rate (breaths/min): 18 Body Mass Index (BMI): 27 Blood Pressure (mmHg): 145/90 Reference Range: 80 - 120 mg / dl Electronic Signature(s) Signed: 09/13/2023 5:12:14 PM By: Midge Aver MSN RN CNS WTA Entered By: Midge Aver on 09/13/2023 08:42:36

## 2023-09-27 ENCOUNTER — Ambulatory Visit: Payer: Managed Care, Other (non HMO) | Admitting: Physician Assistant

## 2023-09-30 ENCOUNTER — Encounter: Payer: Managed Care, Other (non HMO) | Attending: Physician Assistant | Admitting: Physician Assistant

## 2023-09-30 DIAGNOSIS — L89610 Pressure ulcer of right heel, unstageable: Secondary | ICD-10-CM | POA: Insufficient documentation

## 2023-09-30 DIAGNOSIS — I10 Essential (primary) hypertension: Secondary | ICD-10-CM | POA: Diagnosis not present

## 2023-09-30 NOTE — Progress Notes (Addendum)
 Robert, [name] [name] ([id number]) [id number] Page 1 of 8 Visit Report for 09/30/2023 Arrival Information Details Patient Name: Date of Service: [name]. 09/30/2023 9:15 A M Medical Record Number: [id number] Patient Account Number: [id number] Date of Birth/Sex: Treating RN: 07/21/1966 (59 y.o. [name] Claudene Blossom Primary Care Tevion Laforge: Charlott Carrier Other Clinician: Referring Alex Mcmanigal: Treating Rosalina Dingwall/Extender: Bethena Andre Charlott Carrier Devra in Treatment: 17 Visit Information History Since Last Visit Added or deleted any medications: No Patient Arrived: Ambulatory Any new allergies or adverse reactions: No Arrival Time: 09:43 Had a fall or experienced change in No Accompanied By: self activities of daily living that may affect Transfer Assistance: None risk of falls: Patient Identification Verified: Yes Signs or symptoms of abuse/neglect since last visito No Secondary Verification Process Completed: Yes Hospitalized since last visit: No Patient Requires Transmission-Based Precautions: No Has Dressing in Place as Prescribed: Yes Patient Has Alerts: Yes Pain Present Now: No Patient Alerts: Not diabetic Electronic Signature(s) Signed: 09/30/2023 4:26:36 PM By: Claudene Blossom MSN RN CNS WTA Entered By: Claudene Blossom on 09/30/2023 09:45:59 -------------------------------------------------------------------------------- Clinic Level of Care Assessment Details Patient Name: Date of Service: [name], [name]. 09/30/2023 9:15 A M Medical Record Number: [id number] Patient Account Number: [id number] Date of Birth/Sex: Treating RN: 1966/01/14 (58 y.o. [name] Claudene Blossom Primary Care Trevelle Mcgurn: Charlott Carrier Other Clinician: Referring Zaira Iacovelli: Treating Catheryne Deford/Extender: Bethena Andre Charlott Carrier Devra in Treatment: 17 Clinic Level of Care Assessment Items TOOL 1 Quantity Score []  - 0 Use when EandM and Procedure is performed on INITIAL  visit ASSESSMENTS - Nursing Assessment / Reassessment []  - 0 General Physical Exam (combine w/ comprehensive assessment (listed just below) when performed on new pt. evals) []  - 0 Comprehensive Assessment (HX, ROS, Risk Assessments, Wounds Hx, etc.) ASSESSMENTS - Wound and Skin Assessment / Reassessment []  - 0 Dermatologic / Skin Assessment (not related to wound area) Robert, [name] [name] ([id number]) [id number] Page 2 of 8 ASSESSMENTS - Ostomy and/or Continence Assessment and Care []  - 0 Incontinence Assessment and Management []  - 0 Ostomy Care Assessment and Management (repouching, etc.) PROCESS - Coordination of Care []  - 0 Simple Patient / Family Education for ongoing care []  - 0 Complex (extensive) Patient / Family Education for ongoing care []  - 0 Staff obtains Chiropractor, Records, [name] Results / Process Orders est []  - 0 Staff telephones HHA, Nursing Homes / Clarify orders / etc []  - 0 Routine Transfer to another Facility (non-emergent condition) []  - 0 Routine Hospital Admission (non-emergent condition) []  - 0 New Admissions / Manufacturing Engineer / Ordering NPWT Apligraf, etc. , []  - 0 Emergency Hospital Admission (emergent condition) PROCESS - Special Needs []  - 0 Pediatric / Minor Patient Management []  - 0 Isolation Patient Management []  - 0 Hearing / Language / Visual special needs []  - 0 Assessment of Community assistance (transportation, D/C planning, etc.) []  - 0 Additional assistance / Altered mentation []  - 0 Support Surface(s) Assessment (bed, cushion, seat, etc.) INTERVENTIONS - Miscellaneous []  - 0 External ear exam []  - 0 Patient Transfer (multiple staff / Nurse, Adult / Similar devices) []  - 0 Simple Staple / Suture removal (25 or less) []  - 0 Complex Staple / Suture removal (26 or more) []  - 0 Hypo/Hyperglycemic Management (do not check if billed separately) []  - 0 Ankle / Brachial Index (ABI) - do not check if billed  separately Has the patient been seen at the hospital within the last three years: Yes Total Score: 0 Level  Of Care: ____ Electronic Signature(s) Signed: 09/30/2023 4:26:36 PM By: Claudene Blossom MSN RN CNS WTA Entered By: Claudene Blossom on 09/30/2023 10:08:11 -------------------------------------------------------------------------------- Encounter Discharge Information Details Patient Name: Date of Service: [name], [name]. 09/30/2023 9:15 A M Medical Record Number: [id number] Patient Account Number: [id number] Date of Birth/Sex: Treating RN: 02/25/1966 (58 y.o. [name] Claudene Blossom Primary Care Kameka Whan: Charlott Carrier Other Clinician: Referring Aino Heckert: Treating Larkin Morelos/Extender: Bethena Andre Charlott Carrier Devra in Treatment: 17 Encounter Discharge Information Items Post Procedure [name] ([id number]) [id number] Page 3 of 8 Discharge Condition: Stable Temperature (F): 97.7 Ambulatory Status: Ambulatory Pulse (bpm): 79 Discharge Destination: Home Respiratory Rate (breaths/min): 18 Transportation: Private Auto Blood Pressure (mmHg): 166/83 Accompanied By: self Schedule Follow-up Appointment: Yes Clinical Summary of Care: Electronic Signature(s) Signed: 09/30/2023 4:26:36 PM By: Claudene Blossom MSN RN CNS WTA Entered By: Claudene Blossom on 09/30/2023 10:09:44 -------------------------------------------------------------------------------- Lower Extremity Assessment Details Patient Name: Date of Service: [name], [name]. 09/30/2023 9:15 A M Medical Record Number: [id number] Patient Account Number: [id number] Date of Birth/Sex: Treating RN: November 08, 1965 (58 y.o. [name] Claudene Blossom Primary Care Delayne Sanzo: Charlott Carrier Other Clinician: Referring Yalanda Soderman: Treating Shanti Eichel/Extender: Bethena Andre Charlott Carrier Devra in Treatment: 17 Electronic Signature(s) Signed: 09/30/2023 4:26:36 PM By: Claudene Blossom MSN RN CNS WTA Entered By: Claudene Blossom on 09/30/2023 09:52:22 -------------------------------------------------------------------------------- Multi Wound Chart Details Patient Name: Date of Service: [name], [name]. 09/30/2023 9:15 A M Medical Record Number: [id number] Patient Account Number: [id number] Date of Birth/Sex: Treating RN: [birth year] (58 y.o. [name] Claudene Blossom Primary Care Aiyden Lauderback: Charlott Carrier Other Clinician: Referring Lillyanne Bradburn: Treating Natiya Seelinger/Extender: Bethena Andre Charlott Carrier Devra in Treatment: 17 Vital Signs Height(in): 73 Pulse(bpm): 79 Weight(lbs): 205 Blood Pressure(mmHg): 166/83 Body Mass Index(BMI): 27 Temperature(F): 97.7 Respiratory Rate(breaths/min): 18 [1:Photos:] [N/A:N/A] Right Calcaneus N/A N/A Wound Location: Gradually Appeared N/A N/A Wounding Event: Pressure Ulcer N/A N/A Primary Etiology: Hypertension N/A N/A Comorbid History: 05/20/2023 N/A N/A Date Acquired: 1 N/A N/A Weeks of Treatment: Open N/A N/A Wound Status: No N/A N/A Wound Recurrence: Yes N/A N/A Clustered Wound: 0.1x0.1x0.1 N/A N/A Measurements L x W x D (cm) 0.008 N/A N/A A (cm) : rea 0.001 N/A N/A Volume (cm) : 100.00% N/A N/A % Reduction in A rea: 100.00% N/A N/A % Reduction in Volume: Category/Stage IV N/A N/A Classification: Medium N/A N/A Exudate A mount: Serosanguineous N/A N/A Exudate Type: red, brown N/A N/A Exudate Color: None Present (0%) N/A N/A Granulation A mount: Large (67-100%) N/A N/A Necrotic A mount: Eschar N/A N/A Necrotic Tissue: Fat Layer (Subcutaneous Tissue): Yes N/A N/A Exposed Structures: Fascia: No Tendon: No Muscle: No Joint: No Bone: No None N/A N/A Epithelialization: Treatment Notes Electronic Signature(s) Signed: 09/30/2023 4:26:36 PM By: Claudene Blossom MSN RN CNS WTA Entered By: Claudene Blossom on 09/30/2023 09:52:29 -------------------------------------------------------------------------------- Multi-Disciplinary Care Plan  Details Patient Name: Date of Service: [name], [name]. 09/30/2023 9:15 A M Medical Record Number: [id number] Patient Account Number: [id number] Date of Birth/Sex: Treating RN: 03/28/1966 (58 y.o. [name] Claudene Blossom Primary Care Sandy Blouch: Charlott Carrier Other Clinician: Referring Halsey Persaud: Treating Venba Zenner/Extender: Bethena Andre Charlott Carrier Devra in Treatment: 17 Active Inactive Electronic Signature(s) Signed: 09/30/2023 4:26:36 PM By: Claudene Blossom MSN RN CNS WTA Entered By: Claudene Blossom on 09/30/2023 10:08:35 [name] ([id number]) [id number] Page 5 of 8 -------------------------------------------------------------------------------- Pain Assessment Details Patient Name: Date of Service: [name] EPHRAIM SHERRELL [name]. 09/30/2023 9:15 A M Medical Record Number: [id number]  Patient Account Number: [id number] Date of Birth/Sex: Treating RN: 09-15-1966 (58 y.o. [name] Claudene Blossom Primary Care Windel Keziah: Charlott Carrier Other Clinician: Referring Khadir Roam: Treating Shlonda Dolloff/Extender: Bethena Andre Charlott Carrier Devra in Treatment: 17 Active Problems Location of Pain Severity and Description of Pain Patient Has Paino No Site Locations Pain Management and Medication Current Pain Management: Electronic Signature(s) Signed: 09/30/2023 4:26:36 PM By: Claudene Blossom MSN RN CNS WTA Entered By: Claudene Blossom on 09/30/2023 09:48:21 -------------------------------------------------------------------------------- Patient/Caregiver Education Details Patient Name: Date of Service: [name], [name]. 1/9/2025andnbsp9:15 A M Medical Record Number: [id number] Patient Account Number: [id number] Date of Birth/Gender: Treating RN: July 22, 1966 (58 y.o. [name] Claudene Blossom Primary Care Physician: Charlott Carrier Other Clinician: Referring Physician: Treating Physician/Extender: Bethena Andre Charlott Carrier Devra in Treatment: 17 [place], [place] [name] ([id number])  [id number] Page 6 of 8 Education Assessment Education Provided To: Patient Education Topics Provided Wound/Skin Impairment: Handouts: Caring for Your Ulcer Methods: Explain/Verbal Responses: State content correctly Electronic Signature(s) Signed: 09/30/2023 4:26:36 PM By: Claudene Blossom MSN RN CNS WTA Entered By: Claudene Blossom on 09/30/2023 10:08:47 -------------------------------------------------------------------------------- Wound Assessment Details Patient Name: Date of Service: [name], [name]. 09/30/2023 9:15 A M Medical Record Number: [id number] Patient Account Number: [id number] Date of Birth/Sex: Treating RN: 09-12-1966 (58 y.o. [name] Claudene Blossom Primary Care Adaliz Dobis: Charlott Carrier Other Clinician: Referring Emrie Gayle: Treating Ashante Snelling/Extender: Bethena Andre Charlott Carrier Weeks in Treatment: 17 Wound Status Wound Number: 1 Primary Etiology: Pressure Ulcer Wound Location: Right Calcaneus Wound Status: Open Wounding Event: Gradually Appeared Comorbid History: Hypertension Date Acquired: 05/20/2023 Weeks Of Treatment: 17 Clustered Wound: Yes Photos Wound Measurements Length: (cm) 0.1 Width: (cm) 0.1 Depth: (cm) 0.1 Area: (cm) 0.008 Volume: (cm) 0.001 % Reduction in Area: 100% % Reduction in Volume: 100% Epithelialization: None Wound Description Classification: Category/Stage IV Exudate Amount: Medium Exudate Type: Serosanguineous [name], [name] ([id number]) Exudate Color: red, brown Foul Odor After Cleansing: No Slough/Fibrino No [id number] Page 7 of 8 Wound Bed Granulation Amount: None Present (0%) Exposed Structure Necrotic Amount: Large (67-100%) Fascia Exposed: No Necrotic Quality: Eschar Fat Layer (Subcutaneous Tissue) Exposed: Yes Tendon Exposed: No Muscle Exposed: No Joint Exposed: No Bone Exposed: No Treatment Notes Wound #1 (Calcaneus) Wound Laterality: Right Cleanser Soap and  Water Discharge Instruction: Use as directed. Peri-Wound Care Topical Primary Dressing Promogran Matrix 4.34 (in) Discharge Instruction: Moisten w/normal saline or sterile water; Cover wound as directed. Do not remove from wound bed. Secondary Dressing Coverlet Latex-Free Fabric Adhesive Dressings Discharge Instruction: Knuckle Secured With Compression Wrap Compression Stockings Add-Ons Electronic Signature(s) Signed: 09/30/2023 4:26:36 PM By: Claudene Blossom MSN RN CNS WTA Entered By: Claudene Blossom on 09/30/2023 09:52:09 -------------------------------------------------------------------------------- Vitals Details Patient Name: Date of Service: [name], [name]. 09/30/2023 9:15 A M Medical Record Number: [id number] Patient Account Number: [id number] Date of Birth/Sex: Treating RN: 1966-01-24 (58 y.o. [name] Claudene Blossom Primary Care Robert Scarpelli: Charlott Carrier Other Clinician: Referring Noelle Hoogland: Treating Shulamit Donofrio/Extender: Bethena Andre Charlott Carrier Devra in Treatment: 17 Vital Signs Time Taken: 09:46 Temperature (F): 97.7 Height (in): 73 Pulse (bpm): 79 Weight (lbs): 205 Respiratory Rate (breaths/min): 18 Body Mass Index (BMI): 27 Blood Pressure (mmHg): 166/83 Reference Range: 80 - 120 mg / dl Electronic Signature(s) Signed: 09/30/2023 4:26:36 PM By: Claudene Blossom MSN RN CNS [name], [name] 09/30/2023 4:26:36 PM By: Claudene Blossom MSN RN CNS WTA Signed: T ([id number]) [id number] Page 8 of 8 Entered By: Claudene Blossom on 09/30/2023 09:48:13

## 2023-09-30 NOTE — Progress Notes (Addendum)
 [name] ([id number]) [id number] Page 1 of 8 Visit Report for 09/30/2023 Chief Complaint Document Details Patient Name: Date of Service: [name] [name] SHERRELL [name]. 09/30/2023 9:15 A M Medical Record Number: [id number] Patient Account Number: [id number] Date of Birth/Sex: Treating RN: 12-Jun-1966 (58 y.o. [name] Primary Care Provider: Charlott Carrier Other Clinician: Referring Provider: Treating Provider/Extender: Bethena Andre Charlott Carrier Devra in Treatment: 17 Information Obtained from: Patient Chief Complaint Right heel ulcer Electronic Signature(s) Signed: 09/30/2023 9:04:46 AM By: Bethena Andre PA-C Entered By: Bethena Andre on 09/30/2023 09:04:45 -------------------------------------------------------------------------------- Debridement Details Patient Name: Date of Service: [name], Robert [name]. 09/30/2023 9:15 A M Medical Record Number: [id number] Patient Account Number: [id number] Date of Birth/Sex: Treating RN: 1966-04-26 (58 y.o. [name] Primary Care Provider: Charlott Carrier Other Clinician: Referring Provider: Treating Provider/Extender: Bethena Andre Charlott Carrier Devra in Treatment: 17 Debridement Performed for Assessment: Wound #1 Right Calcaneus Performed By: Physician Bethena Andre, PA-C The following information was scribed by: Claudene [name] The information was scribed for: Bethena Andre Debridement Type: Debridement Level of Consciousness (Pre-procedure): Awake and Alert Pre-procedure Verification/Time Out Yes - 09:55 Taken: Start Time: 09:55 Pain Control: Lidocaine  4% [name] opical Solution Percent of Wound Bed Debrided: 100% [name] Area Debrided (cm): otal 0.07 Tissue and other material debrided: Viable, Non-Viable, Callus, Slough, Subcutaneous, Biofilm, Slough Level: Skin/Subcutaneous Tissue Debridement Description: Excisional Instrument: Curette Bleeding: Moderate Hemostasis Achieved: Pressure BOOKERT, GUZZI [name]  ([id number]) [id number] Page 2 of 8 End Time: 09:58 Procedural Pain: 0 Post Procedural Pain: 0 Response to Treatment: Procedure was tolerated well Level of Consciousness (Post- Awake and Alert procedure): Post Debridement Measurements of Total Wound Length: (cm) 0.3 Stage: Category/Stage IV Width: (cm) 0.3 Depth: (cm) 0.1 Volume: (cm) 0.007 Character of Wound/Ulcer Post Debridement: Improved Post Procedure Diagnosis Same as Pre-procedure Electronic Signature(s) Signed: 09/30/2023 3:53:53 PM By: Bethena Andre PA-C Signed: 09/30/2023 4:26:36 PM By: Claudene Blossom MSN RN CNS WTA Entered By: Claudene [name] on 09/30/2023 10:01:13 -------------------------------------------------------------------------------- HPI Details Patient Name: Date of Service: [name], Robert [name]. 09/30/2023 9:15 A M Medical Record Number: [id number] Patient Account Number: [id number] Date of Birth/Sex: Treating RN: 08/25/1966 (58 y.o. [name] Primary Care Provider: Charlott Carrier Other Clinician: Referring Provider: Treating Provider/Extender: Bethena Andre Charlott Carrier Devra in Treatment: 17 History of Present Illness HPI Description: 05-31-2023 patient presents today for initial evaluation here in the clinic concerning issues that he is having with an eschar over the right heel. Unfortunately this came on seemingly fairly suddenly when he was at work. 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 Again I am unsure exactly what went on here and to what degree the chemicals could have played a role in this but this almost looks to me more like damage secondary to a chemical burn versus a strict pressure ulcer although I am unsure to be certain. The issue here is simply that he has 3 areas that are somewhat separated by some skin in between although to some degree connected as well which is very necrotic and eschar covered and  to be honest getting any dressing to this is not really going to be of benefit at this point. Fortunately I do not see any signs of active infection at this time which is good news. With that being said I do think that this is gena be difficult to get heal until we get the eschar off of the  area. The patient voiced understanding and his wife was present during the evaluation today as well. He does have hypertension but no other major medical problems. [name] my knowledge this is not a workers o comp case although I am unsure whether or not it should be to be honest. 06-08-2023 upon evaluation today patient appears to be doing well currently in regard to his wound. He has been tolerating the dressing changes without complication. Fortunately there does not appear to be any signs of active infection locally or systemically which is great news. With that being said he still has a pretty much eschar covering he did get the Santyl after a bit of a fight although he only got 2 tubes this may not last a full month but hopefully we will need it the whole month and this will work out just fine for us  will make do with what we can get. 06-15-2023 upon evaluation today patient's wound is actually starting to soften up as far as the eschar is concerned actually think we may be able to get some of that off today and I discussed that with the patient. He is in agreement with given the a shot to do this. 06-22-2023 upon evaluation today patient appears to be doing much better in regard to his heel actually feel like most of the wounds are actually making significant improvement here which is great news and are very pleased in that regard. I do not see any signs of active infection looking or systemically at this time which is great news and in general I do believe that we are making good headway towards complete closure. 10-7 24 upon evaluation today patient appears to be doing well currently in regard to his wound. Again  the heel is showing signs of excellent improvement and actually very pleased with where we stand I do believe that he is doing quite well currently. 07-05-2023 upon evaluation today patient appears to be doing well currently in regard to his wound. He is actually showing signs of improvement which is great news and in general I do believe that we are making headway towards complete closure which is great news. I think that he is doing well currently with the Iodoflex. 07-12-2023 upon evaluation patient's wound bed actually showed signs of good granulation epithelization at this point. He is tolerating dressing changes without complication and good news is I feel like they were moving in the right direction here. I do not see any signs of active infection at this time. MADIX, BLOWE [name] ([id number]) [id number] Page 3 of 8 07-19-2023 upon evaluation today patient appears to be doing very well in regard to his wound the heel actually showing signs of dramatic improvement at this time. There does not appear to be any signs of active infection which is good news. No fevers, chills, nausea, vomiting, or diarrhea. 08-02-2023 upon evaluation today patient appears to be doing well currently in regard to his wound. He has been tolerating the dressing changes without complication. Fortunately there does not appear to be any signs of active infection locally or systemically which is great news and in general I think that we are moving in the right direction here. There is some biofilm noted on the surface of the wound. 08-09-2023 upon evaluation today patient appears to be doing well currently in regard to his wound. He has been tolerating the dressing changes without complication. Fortunately I do not see any signs of active infection locally or systemically at this time  which is great news. No fevers, chills, nausea, vomiting, or diarrhea. 08-16-2023 upon evaluation today patient  appears to be doing well currently in regard to his heel ulcer. He has been tolerating the dressing changes without complication. Fortunately I do not see any signs of active infection locally or systemically which is great news. 08-30-2023 upon evaluation today patient appears to be doing excellent currently in regard to his heel. He has been tolerating the dressing changes without complication. With that being said I think his heel is making excellent progress here. 09-06-2023 upon evaluation today patient's wound is actually showing signs of excellent improvement I am very pleased with where things stand today. Fortunately I do not see any evidence of active infection at this time which is great news and in general I do believe that we are making really good headway here towards closure which is great news. 09-13-2023 upon evaluation today patient appears to be doing excellent in regard to his wounds. He has been tolerating the dressing changes without complication. Fortunately there does not appear to be any signs of active infection at this time. 09-30-23 upon evaluation today patient appears to be doing well currently in regard to his wound this is actually getting very small is not completely healed yet but this is getting extremely close compared to last time even that I saw him. Fortunately I do not see any evidence of active infection at this time. No fevers, chills, nausea, vomiting, or diarrhea. Electronic Signature(s) Signed: 09/30/2023 10:47:04 AM By: Bethena Ferraris PA-C Entered By: Bethena Ferraris on 09/30/2023 10:47:04 -------------------------------------------------------------------------------- Physical Exam Details Patient Name: Date of Service: [name], EPHRAIM PA [name]. 09/30/2023 9:15 A M Medical Record Number: [id number] Patient Account Number: [id number] Date of Birth/Sex: Treating RN: 1965-12-03 (58 y.o. [name] Primary Care Provider: Charlott Carrier Other Clinician: Referring  Provider: Treating Provider/Extender: Bethena Ferraris Charlott Carrier Devra in Treatment: 17 Constitutional Well-nourished and well-hydrated in no acute distress. Respiratory normal breathing without difficulty. Psychiatric this patient is able to make decisions and demonstrates good insight into disease process. Alert and Oriented x 3. pleasant and cooperative. Notes Upon inspection patient's wound bed actually showed signs of good granulation epithelization at this point. I do not see any signs of infection postdebridement his wound on the heel appears to be doing excellent. Electronic Signature(s) Signed: 09/30/2023 10:47:34 AM By: Bethena Ferraris PA-C Entered By: Bethena Ferraris on 09/30/2023 10:47:33 WADDELL WILLMA DASEN ([id number]) [id number] Page 4 of 8 -------------------------------------------------------------------------------- Physician Orders Details Patient Name: Date of Service: [name] EPHRAIM PA IVAR 09/30/2023 9:15 A M Medical Record Number: [id number] Patient Account Number: [id number] Date of Birth/Sex: Treating RN: 02-10-1966 (58 y.o. [name] Primary Care Provider: Charlott Carrier Other Clinician: Referring Provider: Treating Provider/Extender: Bethena Ferraris Charlott Carrier Devra in Treatment: [phone] Verbal / Phone Orders: No Diagnosis Coding ICD-10 Coding Code Description L89.610 Pressure ulcer of right heel, unstageable I10 Essential (primary) hypertension Follow-up Appointments Return Appointment in 1 week. Nurse Visit as needed Bathing/ Shower/ Hygiene May shower; gently cleanse wound with antibacterial soap, rinse and pat dry prior to dressing wounds Anesthetic (Use 'Patient Medications' Section for Anesthetic Order Entry) Lidocaine  applied to wound bed Wound Treatment Wound #1 - Calcaneus Wound Laterality: Right Cleanser: Soap and Water 3 x Per Week/30 Days Discharge Instructions: Use as directed. Prim Dressing: Promogran  Matrix 4.34 (in) (Dispense As Written) 3 x Per Week/30 Days ary Discharge Instructions: Moisten w/normal saline or sterile water; Cover wound as directed. Do  not remove from wound bed. Secondary Dressing: Coverlet Latex-Free Fabric Adhesive Dressings (Generic) 3 x Per Week/30 Days Discharge Instructions: Knuckle Electronic Signature(s) Signed: 09/30/2023 3:53:53 PM By: Bethena Ferraris PA-C Signed: 09/30/2023 4:26:36 PM By: Claudene Blossom MSN RN CNS WTA Entered By: Claudene [name] on 09/30/2023 10:05:03 -------------------------------------------------------------------------------- Problem List Details Patient Name: Date of Service: [name], Robert [name]. 09/30/2023 9:15 A M Medical Record Number: [id number] Patient Account Number: [id number] Date of Birth/Sex: Treating RN: March 08, 1966 (58 y.o. [place], [place], [place] [name] ([id number]) [id number] Page 5 of 8 Primary Care Provider: Charlott Carrier Other Clinician: Referring Provider: Treating Provider/Extender: Bethena Ferraris Charlott Carrier Devra in Treatment: 17 Active Problems ICD-10 Encounter Code Description Active Date MDM Diagnosis L89.610 Pressure ulcer of right heel, unstageable 05/31/2023 No Yes I10 Essential (primary) hypertension 05/31/2023 No Yes Inactive Problems Resolved Problems Electronic Signature(s) Signed: 09/30/2023 9:04:42 AM By: Bethena Ferraris PA-C Entered By: Bethena Ferraris on 09/30/2023 09:04:42 -------------------------------------------------------------------------------- Progress Note Details Patient Name: Date of Service: [name], Robert [name]. 09/30/2023 9:15 A M Medical Record Number: [id number] Patient Account Number: [id number] Date of Birth/Sex: Treating RN: [birth year] (58 y.o. [name] Primary Care Provider: Charlott Carrier Other Clinician: Referring Provider: Treating Provider/Extender: Bethena Ferraris Charlott Carrier Devra in Treatment: 17 Subjective Chief  Complaint Information obtained from Patient Right heel ulcer History of Present Illness (HPI) 05-31-2023 patient presents today for initial evaluation here in the clinic concerning issues that he is having with an eschar over the right heel. Unfortunately this came on seemingly fairly suddenly when he was at work. 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 Again I am unsure exactly what went on here and to what degree the chemicals could have played a role in this but this almost looks to me more like damage secondary to a chemical burn versus a strict pressure ulcer although I am unsure to be certain. The issue here is simply that he has 3 areas that are somewhat separated by some skin in between although to some degree connected as well which is very necrotic and eschar covered and to be honest getting any dressing to this is not really going to be of benefit at this point. Fortunately I do not see any signs of active infection at this time which is good news. With that being said I do think that this is gena be difficult to get heal until we get the eschar off of the area. The patient voiced understanding and his wife was present during the evaluation today as well. He does have hypertension but no other major medical problems. [name] my knowledge this is not a workers comp o case although I am unsure whether or not it should be to be honest. 06-08-2023 upon evaluation today patient appears to be doing well currently in regard to his wound. He has been tolerating the dressing changes without complication. Fortunately there does not appear to be any signs of active infection locally or systemically which is great news. With that being said he still has a pretty much eschar covering he did get the Santyl after a bit of a fight although he only got 2 tubes this may not last a full month but hopefully we will need it the whole month and this will work  out just fine for us  will make do with what we can get. 06-15-2023 upon evaluation today patient's wound is actually starting to soften  up as far as the eschar is concerned actually think we may be able to get some of that off today and I discussed that with the patient. He is in agreement with given the a shot to do this. 06-22-2023 upon evaluation today patient appears to be doing much better in regard to his heel actually feel like most of the wounds are actually making significant improvement here which is great news and are very pleased in that regard. I do not see any signs of active infection looking or systemically at this time which is great news and in general I do believe that we are making good headway towards complete closure. SLYVESTER, LATONA [name] ([id number]) [id number] Page 6 of 8 10-7 24 upon evaluation today patient appears to be doing well currently in regard to his wound. Again the heel is showing signs of excellent improvement and actually very pleased with where we stand I do believe that he is doing quite well currently. 07-05-2023 upon evaluation today patient appears to be doing well currently in regard to his wound. He is actually showing signs of improvement which is great news and in general I do believe that we are making headway towards complete closure which is great news. I think that he is doing well currently with the Iodoflex. 07-12-2023 upon evaluation patient's wound bed actually showed signs of good granulation epithelization at this point. He is tolerating dressing changes without complication and good news is I feel like they were moving in the right direction here. I do not see any signs of active infection at this time. 07-19-2023 upon evaluation today patient appears to be doing very well in regard to his wound the heel actually showing signs of dramatic improvement at this time. There does not appear to be any signs of active infection  which is good news. No fevers, chills, nausea, vomiting, or diarrhea. 08-02-2023 upon evaluation today patient appears to be doing well currently in regard to his wound. He has been tolerating the dressing changes without complication. Fortunately there does not appear to be any signs of active infection locally or systemically which is great news and in general I think that we are moving in the right direction here. There is some biofilm noted on the surface of the wound. 08-09-2023 upon evaluation today patient appears to be doing well currently in regard to his wound. He has been tolerating the dressing changes without complication. Fortunately I do not see any signs of active infection locally or systemically at this time which is great news. No fevers, chills, nausea, vomiting, or diarrhea. 08-16-2023 upon evaluation today patient appears to be doing well currently in regard to his heel ulcer. He has been tolerating the dressing changes without complication. Fortunately I do not see any signs of active infection locally or systemically which is great news. 08-30-2023 upon evaluation today patient appears to be doing excellent currently in regard to his heel. He has been tolerating the dressing changes without complication. With that being said I think his heel is making excellent progress here. 09-06-2023 upon evaluation today patient's wound is actually showing signs of excellent improvement I am very pleased with where things stand today. Fortunately I do not see any evidence of active infection at this time which is great news and in general I do believe that we are making really good headway here towards closure which is great news. 09-13-2023 upon evaluation today patient appears to be doing excellent in regard to his wounds. He  has been tolerating the dressing changes without complication. Fortunately there does not appear to be any signs of active infection at this time. 09-30-23 upon  evaluation today patient appears to be doing well currently in regard to his wound this is actually getting very small is not completely healed yet but this is getting extremely close compared to last time even that I saw him. Fortunately I do not see any evidence of active infection at this time. No fevers, chills, nausea, vomiting, or diarrhea. Objective Constitutional Well-nourished and well-hydrated in no acute distress. Vitals Time Taken: 9:46 AM, Height: 73 in, Weight: 205 lbs, BMI: 27, Temperature: 97.7 F, Pulse: 79 bpm, Respiratory Rate: 18 breaths/min, Blood Pressure: 166/83 mmHg. Respiratory normal breathing without difficulty. Psychiatric this patient is able to make decisions and demonstrates good insight into disease process. Alert and Oriented x 3. pleasant and cooperative. General Notes: Upon inspection patient's wound bed actually showed signs of good granulation epithelization at this point. I do not see any signs of infection postdebridement his wound on the heel appears to be doing excellent. Integumentary (Hair, Skin) Wound #1 status is Open. Original cause of wound was Gradually Appeared. The date acquired was: 05/20/2023. The wound has been in treatment 17 weeks. The wound is located on the Right Calcaneus. The wound measures 0.1cm length x 0.1cm width x 0.1cm depth; 0.008cm^2 area and 0.001cm^3 volume. There is Fat Layer (Subcutaneous Tissue) exposed. There is a medium amount of serosanguineous drainage noted. There is no granulation within the wound bed. There is a large (67-100%) amount of necrotic tissue within the wound bed including Eschar. Assessment Active Problems ICD-10 Pressure ulcer of right heel, unstageable Essential (primary) hypertension Procedures KELLAN, RAFFIELD [name] ([id number]) [id number] Page 7 of 8 Wound #1 Pre-procedure diagnosis of Wound #1 is a Pressure Ulcer located on the Right Calcaneus . There was a Excisional  Skin/Subcutaneous Tissue Debridement with a total area of 0.07 sq cm performed by Bethena Ferraris, PA-C. With the following instrument(s): Curette to remove Viable and Non-Viable tissue/material. Material removed includes Callus, Subcutaneous Tissue, Slough, and Biofilm after achieving pain control using Lidocaine  4% [name] opical Solution. No specimens were taken. A time out was conducted at 09:55, prior to the start of the procedure. A Moderate amount of bleeding was controlled with Pressure. The procedure was tolerated well with a pain level of 0 throughout and a pain level of 0 following the procedure. Post Debridement Measurements: 0.3cm length x 0.3cm width x 0.1cm depth; 0.007cm^3 volume. Post debridement Stage noted as Category/Stage IV. Character of Wound/Ulcer Post Debridement is improved. Post procedure Diagnosis Wound #1: Same as Pre-Procedure Plan Follow-up Appointments: Return Appointment in 1 week. Nurse Visit as needed Bathing/ Shower/ Hygiene: May shower; gently cleanse wound with antibacterial soap, rinse and pat dry prior to dressing wounds Anesthetic (Use 'Patient Medications' Section for Anesthetic Order Entry): Lidocaine  applied to wound bed WOUND #1: - Calcaneus Wound Laterality: Right Cleanser: Soap and Water 3 x Per Week/30 Days Discharge Instructions: Use as directed. Prim Dressing: Promogran Matrix 4.34 (in) (Dispense As Written) 3 x Per Week/30 Days ary Discharge Instructions: Moisten w/normal saline or sterile water; Cover wound as directed. Do not remove from wound bed. Secondary Dressing: Coverlet Latex-Free Fabric Adhesive Dressings (Generic) 3 x Per Week/30 Days Discharge Instructions: Knuckle 1. I would recommend that we have the patient continue to monitor for any signs of infection or worsening and he is in agreement with the plan this includes the use of the silver  collagen dressing which will get a switch to today. I think it is helping to heal hopefully much  more quickly. 2. I am going to recommend he should continue just with a coverlet to cover this to make it much easier to put on and I think it will do a good job here for him. We will see patient back for reevaluation in 1 week here in the clinic. If anything worsens or changes patient will contact our office for additional recommendations. Electronic Signature(s) Signed: 09/30/2023 10:48:04 AM By: Bethena Ferraris PA-C Entered By: Bethena Ferraris on 09/30/2023 10:48:04 -------------------------------------------------------------------------------- SuperBill Details Patient Name: Date of Service: [name], Robert [name]. 09/30/2023 Medical Record Number: [id number] Patient Account Number: [id number] Date of Birth/Sex: Treating RN: 21-Jan-1966 (58 y.o. [name] Primary Care Provider: Charlott Carrier Other Clinician: Referring Provider: Treating Provider/Extender: Bethena Ferraris Charlott Carrier Devra in Treatment: 17 Diagnosis Coding ICD-10 Codes Code Description L89.610 Pressure ulcer of right heel, unstageable I10 Essential (primary) hypertension Facility Procedures : ISAM, UNREIN Code: LOYD [name] ([id number]) [id number] ICD L Description: [id number] 42 - DEB SUBQ TISSUE 20 SQ CM/< -10 Diagnosis Description 89.610 Pressure ulcer of right heel, unstageable Modifier: [id number] Page 8 of 8 1 Quantity: Physician Procedures : CPT4 Code Description Modifier 11042 11042 - WC PHYS SUBQ TISS 20 SQ CM ICD-10 Diagnosis Description L89.610 Pressure ulcer of right heel, unstageable Quantity: 1 Electronic Signature(s) Signed: 09/30/2023 10:48:14 AM By: Bethena Ferraris PA-C Entered By: Bethena Ferraris on 09/30/2023 10:48:14

## 2023-10-06 ENCOUNTER — Encounter: Payer: Managed Care, Other (non HMO) | Admitting: Physician Assistant

## 2023-10-06 DIAGNOSIS — L89610 Pressure ulcer of right heel, unstageable: Secondary | ICD-10-CM | POA: Diagnosis not present

## 2023-10-07 NOTE — Progress Notes (Signed)
COWEN, JEWART [name] ([id number]) [id number] Page 1 of 7 Visit Report for 10/06/2023 Arrival Information Details Patient Name: Date of Service: Robert [place] [name]. 10/06/2023 3:15 PM Medical Record Number: [id number] Patient Account Number: [id number] Date of Birth/Sex: Treating RN: 05-06-1966 (58 y.o. Barnett Abu, Leah Primary Care Suzetta Timko: Eleanora Neighbor Other Clinician: Referring Chayton Murata: Treating Arif Amendola/Extender: Brandt Loosen in Treatment: 18 Visit Information History Since Last Visit All ordered tests and consults were completed: No Patient Arrived: Ambulatory Added or deleted any medications: No Arrival Time: 15:14 Pain Present Now: No Accompanied By: self Transfer Assistance: None Patient Requires Transmission-Based Precautions: No Patient Has Alerts: Yes Patient Alerts: Not diabetic Electronic Signature(s) Signed: 10/06/2023 4:06:08 PM By: Bonnell Public Entered By: Bonnell Public on 10/06/2023 15:16:13 -------------------------------------------------------------------------------- Encounter Discharge Information Details Patient Name: Date of Service: [name], Robert [name]. 10/06/2023 3:15 PM Medical Record Number: [id number] Patient Account Number: [id number] Date of Birth/Sex: Treating RN: Apr 12, 1966 (58 y.o. Barnett Abu, Leah Primary Care Hoa Briggs: Eleanora Neighbor Other Clinician: Referring Mariya Mottley: Treating Clydette Privitera/Extender: Brandt Loosen in Treatment: 18 Encounter Discharge Information Items Post Procedure Vitals Discharge Condition: Stable Temperature (F): 98.1 Ambulatory Status: Ambulatory Pulse (bpm): 62 Discharge Destination: Home Respiratory Rate (breaths/min): 16 Transportation: Private Auto Blood Pressure (mmHg): 141/88 Accompanied By: self Schedule Follow-up Appointment: Yes Clinical Summary of Care: Electronic Signature(s) Signed: 10/06/2023 4:06:08 PM By: Bonnell Public Entered By: Bonnell Public on 10/06/2023 15:53:02 [name] ([id number]) [id number] Page 2 of 7 -------------------------------------------------------------------------------- Lower Extremity Assessment Details Patient Name: Date of Service: Robert [place] [name]. 10/06/2023 3:15 PM Medical Record Number: [id number] Patient Account Number: [id number] Date of Birth/Sex: Treating RN: [birth year] (58 y.o. Barnett Abu, Leah Primary Care Atthew Coutant: Eleanora Neighbor Other Clinician: Referring Gracemarie Skeet: Treating Rohil Lesch/Extender: Brandt Loosen in Treatment: 18 Edema Assessment Assessed: [Left: No] [Right: No] Edema: [Left: N] [Right: o] Vascular Assessment Pulses: Dorsalis Pedis Palpable: [Right:Yes] Posterior Tibial Palpable: [Right:Yes] Extremity colors, hair growth, and conditions: Temperature of Extremity: [Right:Warm < 3 seconds] Electronic Signature(s) Signed: 10/06/2023 4:06:08 PM By: Bonnell Public Entered By: Bonnell Public on 10/06/2023 15:23:36 -------------------------------------------------------------------------------- Multi Wound Chart Details Patient Name: Date of Service: [name], Robert [name]. 10/06/2023 3:15 PM Medical Record Number: [id number] Patient Account Number: [id number] Date of Birth/Sex: Treating RN: Oct 08, 1965 (58 y.o. Barnett Abu, Leah Primary Care Yazen Rosko: Eleanora Neighbor Other Clinician: Referring Elora Wolter: Treating Elianna Windom/Extender: Brandt Loosen in Treatment: 18 Vital Signs Height(in): 73 Pulse(bpm): 62 Weight(lbs): 205 Blood Pressure(mmHg): 141/88 Body Mass Index(BMI): 27 Temperature(F): 98.1 Respiratory Rate(breaths/min): 16 [[name], [name] ([id number]):Photos:] [id number] Page 3 of 7:1 N/A N/A N/A N/A] Right Calcaneus N/A N/A Wound Location: Gradually Appeared N/A N/A Wounding Event: Pressure Ulcer N/A N/A Primary  Etiology: Hypertension N/A N/A Comorbid History: 05/20/2023 N/A N/A Date Acquired: 89 N/A N/A Weeks of Treatment: Open N/A N/A Wound Status: No N/A N/A Wound Recurrence: Yes N/A N/A Clustered Wound: 0.1x0.1x0.1 N/A N/A Measurements L x W x D (cm) 0.008 N/A N/A A (cm) : rea 0.001 N/A N/A Volume (cm) : 100.00% N/A N/A % Reduction in A rea: 100.00% N/A N/A % Reduction in Volume: Category/Stage IV N/A N/A Classification: None Present N/A N/A Exudate A mount: Large (67-100%) N/A N/A Granulation A mount: Red N/A N/A Granulation Quality: None Present (0%) N/A N/A Necrotic A mount: Fat Layer (Subcutaneous Tissue): Yes N/A N/A Exposed Structures: Fascia: No Tendon: No Muscle: No Joint: No Bone: No None N/A  N/A Epithelialization: Treatment Notes Electronic Signature(s) Signed: 10/06/2023 4:06:08 PM By: Bonnell Public Entered By: Bonnell Public on 10/06/2023 15:23:40 -------------------------------------------------------------------------------- Multi-Disciplinary Care Plan Details Patient Name: Date of Service: [name], Robert [place] [name]. 10/06/2023 3:15 PM Medical Record Number: [id number] Patient Account Number: [id number] Date of Birth/Sex: Treating RN: 1966-09-03 (58 y.o. Barnett Abu, Leah Primary Care Maddalynn Barnard: Eleanora Neighbor Other Clinician: Referring Kwaku Mostafa: Treating Lettie Czarnecki/Extender: Brandt Loosen in Treatment: 18 Active Inactive Electronic Signature(s) Signed: 10/06/2023 4:06:08 PM By: Bonnell Public Entered By: Bonnell Public on 10/06/2023 15:51:44 [name] ([id number]) [id number] Page 4 of 7 -------------------------------------------------------------------------------- Pain Assessment Details Patient Name: Date of Service: Robert [place] [name]. 10/06/2023 3:15 PM Medical Record Number: [id number] Patient Account Number: [id number] Date of Birth/Sex: Treating RN: 07-04-1966 (58 y.o. Barnett Abu,  Leah Primary Care Alleyne Lac: Eleanora Neighbor Other Clinician: Referring Thong Feeny: Treating Trany Chernick/Extender: Brandt Loosen in Treatment: 18 Active Problems Location of Pain Severity and Description of Pain Patient Has Paino No Site Locations Pain Management and Medication Current Pain Management: Electronic Signature(s) Signed: 10/06/2023 4:06:08 PM By: Bonnell Public Entered By: Bonnell Public on 10/06/2023 15:18:52 -------------------------------------------------------------------------------- Patient/Caregiver Education Details Patient Name: Date of Service: [name] 1/15/2025andnbsp3:15 PM Medical Record Number: [id number] Patient Account Number: [id number] Date of Birth/Gender: Treating RN: 11-10-1965 (58 y.o. Barnett Abu, Leah Primary Care Physician: Eleanora Neighbor Other Clinician: Referring Physician: Treating Physician/Extender: Brandt Loosen in Treatment: [address] [place] [name] ([id number]) [id number] Page 5 of 7 Education Assessment Education Provided To: Patient Education Topics Provided Wound/Skin Impairment: Handouts: Caring for Your Ulcer Methods: Explain/Verbal Responses: State content correctly Electronic Signature(s) Signed: 10/06/2023 4:06:08 PM By: Bonnell Public Entered By: Bonnell Public on 10/06/2023 15:52:25 -------------------------------------------------------------------------------- Wound Assessment Details Patient Name: Date of Service: [name], Robert [name]. 10/06/2023 3:15 PM Medical Record Number: [id number] Patient Account Number: [id number] Date of Birth/Sex: Treating RN: Apr 06, 1966 (58 y.o. Barnett Abu, Leah Primary Care Seth Friedlander: Eleanora Neighbor Other Clinician: Referring Brighid Koch: Treating Alima Naser/Extender: Brandt Loosen in Treatment: 18 Wound Status Wound Number: 1 Primary Etiology: Pressure Ulcer Wound Location: Right  Calcaneus Wound Status: Open Wounding Event: Gradually Appeared Comorbid History: Hypertension Date Acquired: 05/20/2023 Weeks Of Treatment: 18 Clustered Wound: Yes Photos Wound Measurements Length: (cm) 0.1 Width: (cm) 0.1 Depth: (cm) 0.1 Area: (cm) 0.008 Volume: (cm) 0.001 % Reduction in Area: 100% % Reduction in Volume: 100% Epithelialization: None Wound Description Classification: Category/Stage IV Exudate Amount: None Present [name], [name] ([id number]) Foul Odor After Cleansing: No Slough/Fibrino No [id number] Page 6 of 7 Wound Bed Granulation Amount: Large (67-100%) Exposed Structure Granulation Quality: Red Fascia Exposed: No Necrotic Amount: None Present (0%) Fat Layer (Subcutaneous Tissue) Exposed: Yes Tendon Exposed: No Muscle Exposed: No Joint Exposed: No Bone Exposed: No Treatment Notes Wound #1 (Calcaneus) Wound Laterality: Right Cleanser Soap and Water Discharge Instruction: Use as directed. Peri-Wound Care Topical Primary Dressing Promogran Matrix 4.34 (in) Discharge Instruction: Moisten w/normal saline or sterile water; Cover wound as directed. Do not remove from wound bed. Secondary Dressing Coverlet Latex-Free Fabric Adhesive Dressings Discharge Instruction: Knuckle Secured With Compression Wrap Compression Stockings Add-Ons Electronic Signature(s) Signed: 10/06/2023 4:06:08 PM By: Bonnell Public Entered By: Bonnell Public on 10/06/2023 15:23:19 -------------------------------------------------------------------------------- Vitals Details Patient Name: Date of Service: [name], Robert [name]. 10/06/2023 3:15 PM Medical Record Number: [id number] Patient Account Number: [id number] Date of Birth/Sex: Treating RN: December 28, 1965 (58 y.o. Barnett Abu, Leah Primary Care Deyana Wnuk:  Eleanora Neighbor Other Clinician: Referring [name]: Treating Lorelie Biermann/Extender: Brandt Loosen in Treatment: 18 Vital  Signs Time Taken: 15:15 Temperature (F): 98.1 Height (in): 73 Pulse (bpm): 62 Weight (lbs): 205 Respiratory Rate (breaths/min): 16 Body Mass Index (BMI): 27 Blood Pressure (mmHg): 141/88 Reference Range: 80 - 120 mg / dl Electronic Signature(s) Signed: 10/06/2023 4:06:08 PM By: Bonnell Public Entered By: Bonnell Public on 10/06/2023 15:18:03 [name] ([id number]) [id number] Page 7 of 7

## 2023-10-07 NOTE — Progress Notes (Addendum)
[name] ([id number]) [id number] Page 1 of 7 Visit Report for 10/06/2023 Chief Complaint Document Details Patient Name: Date of Service: [name]. 10/06/2023 3:15 PM Medical Record Number: [id number] Patient Account Number: [id number] Date of Birth/Sex: Treating RN: 1966-05-20 (58 y.o. [name] Primary Care Provider: Eleanora [name] Other Clinician: Referring Provider: Treating Provider/Extender: [name]: 18 Information Obtained from: Patient Chief Complaint Right heel ulcer Electronic Signature(s) Signed: 10/06/2023 3:26:00 PM By: [name] Entered By: Robert Derry on 10/06/2023 15:26:00 -------------------------------------------------------------------------------- Debridement Details Patient Name: Date of Service: [name]. 10/06/2023 3:15 PM Medical Record Number: [id number] Patient Account Number: [id number] Date of Birth/Sex: Treating RN: August 27, 1966 (58 y.o. [name] Primary Care Provider: Eleanora [name] Other Clinician: Referring Provider: Treating Provider/Extender: [name]: 18 Debridement Performed for Assessment: Wound #1 Right Calcaneus Performed By: Physician Robert Derry, [name] Debridement Type: Debridement Level of Consciousness (Pre-procedure): Awake and Alert Pre-procedure Verification/Time Out Yes - 15:46 Taken: Start Time: 15:46 Pain Control: Lidocaine 2% [name] opical Gel Percent of Wound Bed Debrided: 100% [name] Area Debrided (cm): otal 0.03 Tissue and other material debrided: Viable, Non-Viable, Callus, Biofilm Level: Non-Viable Tissue Debridement Description: Selective/Open Wound Instrument: Curette Bleeding: None Hemostasis Achieved: Pressure End Time: 15:47 Procedural Pain: 0 Post Procedural Pain: 0 [name], [name] ([id number]) [id number] Page 2 of 7 Response to  Treatment: Procedure was tolerated well Level of Consciousness (Post- Awake and Alert procedure): Post Debridement Measurements of Total Wound Length: (cm) 0.2 Stage: Category/Stage IV Width: (cm) 0.2 Depth: (cm) 0.2 Volume: (cm) 0.006 Character of Wound/Ulcer Post Debridement: Stable Post Procedure Diagnosis Same as Pre-procedure Electronic Signature(s) Signed: 10/06/2023 4:06:08 PM By: [name] Signed: 10/07/2023 6:05:15 PM By: [name] Entered By: [name] on 10/06/2023 15:48:25 -------------------------------------------------------------------------------- HPI Details Patient Name: Date of Service: [name]. 10/06/2023 3:15 PM Medical Record Number: [id number] Patient Account Number: [id number] Date of Birth/Sex: Treating RN: [birth year] (58 y.o. [name] Primary Care Provider: Eleanora [name] Other Clinician: Referring Provider: Treating Provider/Extender: [name]: 18 History of Present Illness HPI Description: 05-31-2023 patient presents today for initial evaluation here in the clinic concerning issues that he is having with an eschar over the right heel. Unfortunately this came on seemingly fairly suddenly when he was at work. He does work around [employer] but states he was not doing anything directly with them at that point. Again I am unsure exactly what went on here and to what degree the chemicals could have played a role in this but this almost looks to me more like damage secondary to a chemical burn versus a strict pressure ulcer although I am unsure to be certain. The issue here is simply that he has 3 areas that are somewhat separated by some skin in between although to some degree connected as well which is very necrotic and eschar covered and to be honest getting any dressing to this is not really going to be of benefit at this point. Fortunately I do not see any signs of active infection  at this time which is good news. With that being said I do think that this is gena be difficult to get heal until we get the eschar off of the area. The patient voiced understanding and his wife was present during the evaluation today as well. He does have hypertension but no other major  medical problems. [name] my knowledge this is not a workers o comp case although I am unsure whether or not it should be to be honest. 10-06-23 upon evaluation patient's wound on the heel actually showing signs of significant improvement I am actually very pleased with where we stand I do believe that he is making good headway here towards closure which is great news. I do not see any evidence of worsening overall and I believe that he is overall very close to complete resolution I think it will probably be done come next week. Electronic Signature(s) Signed: 10/06/2023 5:29:23 PM By: [name] Entered By: Robert Derry on 10/06/2023 17:29:23 [name] ([id number]) [id number] Page 3 of 7 -------------------------------------------------------------------------------- Physical Exam Details Patient Name: Date of Service: [name]. 10/06/2023 3:15 PM Medical Record Number: [id number] Patient Account Number: [id number] Date of Birth/Sex: Treating RN: 10-Apr-1966 (58 y.o. [name] Primary Care Provider: Eleanora [name] Other Clinician: Referring Provider: Treating Provider/Extender: [name]: 18 Constitutional Well-nourished and well-hydrated in no acute distress. Respiratory normal breathing without difficulty. Psychiatric this patient is able to make decisions and demonstrates good insight into disease process. Alert and Oriented x 3. pleasant and cooperative. Notes Upon inspection patient's wound bed actually showed signs of good granulation epithelization at this point. Fortunately I do not see any evidence of  worsening overall and I believe that the patient is making headway here towards closure. In fact I think he is almost completely done I did perform debridement clearway some necrotic debris this was primarily callus and just some biofilm nothing else at this point. Electronic Signature(s) Signed: 10/06/2023 5:29:36 PM By: [name] Entered By: Robert Derry on 10/06/2023 17:29:35 -------------------------------------------------------------------------------- Physician Orders Details Patient Name: Date of Service: [name]. 10/06/2023 3:15 PM Medical Record Number: [id number] Patient Account Number: [id number] Date of Birth/Sex: Treating RN: 1966-05-01 (58 y.o. [name] Primary Care Provider: Eleanora [name] Other Clinician: Referring Provider: Treating Provider/Extender: [name]: [phone] Verbal / Phone Orders: No Diagnosis Coding ICD-10 Coding Code Description L89.610 Pressure ulcer of right heel, unstageable I10 Essential (primary) hypertension Follow-up Appointments Return Appointment in 1 week. Nurse Visit as needed WESTER, [name] ([id number]) [id number] Page 4 of 7 Bathing/ Shower/ Hygiene May shower; gently cleanse wound with antibacterial soap, rinse and pat dry prior to dressing wounds Anesthetic (Use 'Patient Medications' Section for Anesthetic Order Entry) Lidocaine applied to wound bed Wound Treatment Wound #1 - Calcaneus Wound Laterality: Right Cleanser: Soap and Water 3 x Per Week/Robert Days Discharge Instructions: Use as directed. Prim Dressing: Promogran Matrix 4.34 (in) (Dispense As Written) 3 x Per Week/Robert Days ary Discharge Instructions: Moisten w/normal saline or sterile water; Cover wound as directed. Do not remove from wound bed. Secondary Dressing: Coverlet Latex-Free Fabric Adhesive Dressings (Generic) 3 x Per Week/Robert Days Discharge Instructions: Knuckle Electronic  Signature(s) Signed: 10/06/2023 4:06:08 PM By: [name] Signed: 10/07/2023 6:05:15 PM By: [name] Entered By: [name] on 10/06/2023 15:51:31 -------------------------------------------------------------------------------- Problem List Details Patient Name: Date of Service: [name], [name]. 10/06/2023 3:15 PM Medical Record Number: [id number] Patient Account Number: [id number] Date of Birth/Sex: Treating RN: 03-22-1966 (58 y.o. [name] Primary Care Provider: Eleanora [name] Other Clinician: Referring Provider: Treating Provider/Extender: [name]: 18 Active Problems ICD-10 Encounter Code Description Active Date MDM Diagnosis L89.610 Pressure ulcer of right heel,  unstageable 05/31/2023 No Yes I10 Essential (primary) hypertension 05/31/2023 No Yes Inactive Problems Resolved Problems Electronic Signature(s) Signed: 10/06/2023 3:25:55 PM By: [name] Entered By: Robert Derry on 10/06/2023 15:25:55 [name] ([id number]) [id number] Page 5 of 7 -------------------------------------------------------------------------------- Progress Note Details Patient Name: Date of Service: [name]. 10/06/2023 3:15 PM Medical Record Number: [id number] Patient Account Number: [id number] Date of Birth/Sex: Treating RN: [birth year] (58 y.o. [name] Primary Care Provider: Eleanora [name] Other Clinician: Referring Provider: Treating Provider/Extender: [name]: 18 Subjective Chief Complaint Information obtained from Patient Right heel ulcer History of Present Illness (HPI) 05-31-2023 patient presents today for initial evaluation here in the clinic concerning issues that he is having with an eschar over the right heel. Unfortunately this came on seemingly fairly suddenly when he was at work. He does work around [employer] but  states he was not doing anything directly with them at that point. Again I am unsure exactly what went on here and to what degree the chemicals could have played a role in this but this almost looks to me more like damage secondary to a chemical burn versus a strict pressure ulcer although I am unsure to be certain. The issue here is simply that he has 3 areas that are somewhat separated by some skin in between although to some degree connected as well which is very necrotic and eschar covered and to be honest getting any dressing to this is not really going to be of benefit at this point. Fortunately I do not see any signs of active infection at this time which is good news. With that being said I do think that this is gena be difficult to get heal until we get the eschar off of the area. The patient voiced understanding and his wife was present during the evaluation today as well. He does have hypertension but no other major medical problems. [name] my knowledge this is not a workers comp o case although I am unsure whether or not it should be to be honest. 10-06-23 upon evaluation patient's wound on the heel actually showing signs of significant improvement I am actually very pleased with where we stand I do believe that he is making good headway here towards closure which is great news. I do not see any evidence of worsening overall and I believe that he is overall very close to complete resolution I think it will probably be done come next week. Objective Constitutional Well-nourished and well-hydrated in no acute distress. Vitals Time Taken: 3:15 PM, Height: 73 in, Weight: 205 lbs, BMI: 27, Temperature: 98.1 F, Pulse: 62 bpm, Respiratory Rate: 16 breaths/min, Blood Pressure: 141/88 mmHg. Respiratory normal breathing without difficulty. Psychiatric this patient is able to make decisions and demonstrates good insight into disease process. Alert and Oriented x 3. pleasant and cooperative. General  Notes: Upon inspection patient's wound bed actually showed signs of good granulation epithelization at this point. Fortunately I do not see any evidence of worsening overall and I believe that the patient is making headway here towards closure. In fact I think he is almost completely done I did perform debridement clearway some necrotic debris this was primarily callus and just some biofilm nothing else at this point. Integumentary (Hair, Skin) Wound #1 status is Open. Original cause of wound was Gradually Appeared. The date acquired was: 05/20/2023. The wound has been in treatment 18 weeks. The wound is located on the Right Calcaneus. The  wound measures 0.1cm length x 0.1cm width x 0.1cm depth; 0.008cm^2 area and 0.001cm^3 volume. There is Fat Layer (Subcutaneous Tissue) exposed. There is a none present amount of drainage noted. There is large (67-100%) red granulation within the wound bed. There is no necrotic tissue within the wound bed. Assessment [name], [name] ([id number]) [id number] Page 6 of 7 ICD-10 Pressure ulcer of right heel, unstageable Essential (primary) hypertension Procedures Wound #1 Pre-procedure diagnosis of Wound #1 is a Pressure Ulcer located on the Right Calcaneus . There was a Selective/Open Wound Non-Viable Tissue Debridement with a total area of 0.03 sq cm performed by Robert Derry, [name]. With the following instrument(s): Curette to remove Viable and Non-Viable tissue/material. Material removed includes Callus and Biofilm and after achieving pain control using Lidocaine 2% [name] opical Gel. No specimens were taken. A time out was conducted at 15:46, prior to the start of the procedure. There was no bleeding. The procedure was tolerated well with a pain level of 0 throughout and a pain level of 0 following the procedure. Post Debridement Measurements: 0.2cm length x 0.2cm width x 0.2cm depth; 0.006cm^3 volume. Post debridement  Stage noted as Category/Stage IV. Character of Wound/Ulcer Post Debridement is stable. Post procedure Diagnosis Wound #1: Same as Pre-Procedure Plan Follow-up Appointments: Return Appointment in 1 week. Nurse Visit as needed Bathing/ Shower/ Hygiene: May shower; gently cleanse wound with antibacterial soap, rinse and pat dry prior to dressing wounds Anesthetic (Use 'Patient Medications' Section for Anesthetic Order Entry): Lidocaine applied to wound bed WOUND #1: - Calcaneus Wound Laterality: Right Cleanser: Soap and Water 3 x Per Week/Robert Days Discharge Instructions: Use as directed. Prim Dressing: Promogran Matrix 4.34 (in) (Dispense As Written) 3 x Per Week/Robert Days ary Discharge Instructions: Moisten w/normal saline or sterile water; Cover wound as directed. Do not remove from wound bed. Secondary Dressing: Coverlet Latex-Free Fabric Adhesive Dressings (Generic) 3 x Per Week/Robert Days Discharge Instructions: Knuckle 1. I would recommend that we have the patient going to continue with the wound care measures as before he is in agreement with plan. This includes the use of the Promogran which I think is doing a good job here. 2. We can use the coverlet over top. 3. Also going to recommend that he continue to monitor for any signs of infection or worsening if anything changes he knows to contact the office and let me know. We will see patient back for reevaluation in 1 week here in the clinic. If anything worsens or changes patient will contact our office for additional recommendations. Electronic Signature(s) Signed: 10/06/2023 5:32:03 PM By: [name] Entered By: Robert Derry on 10/06/2023 17:32:02 -------------------------------------------------------------------------------- SuperBill Details Patient Name: Date of Service: [name]. 10/06/2023 Medical Record Number: [id number] Patient Account Number: [id number] Date of Birth/Sex: Treating RN: 03-30-1966 (58 y.o. [name] Primary Care Provider: Eleanora [name] Other Clinician: Referring Provider: Treating Provider/Extender: [name]: [address] [name] ([id number]) [id number] Page 7 of 7 Diagnosis Coding ICD-10 Codes Code Description L89.610 Pressure ulcer of right heel, unstageable I10 Essential (primary) hypertension Facility Procedures : CPT4 Code: [id number] Description: 97597 - DEBRIDE WOUND 1ST 20 SQ CM OR < ICD-10 Diagnosis Description L89.610 Pressure ulcer of right heel, unstageable Modifier: Quantity: 1 Physician Procedures : CPT4 Code Description Modifier [id number] - WC PHYS DEBR WO ANESTH 20 SQ CM ICD-10 Diagnosis Description L89.610 Pressure ulcer of right heel, unstageable Quantity: 1 Electronic Signature(s) Signed: 10/06/2023  5:36:12 PM By: [name] Entered By: Robert Derry on 10/06/2023 17:36:12

## 2023-10-14 ENCOUNTER — Encounter: Payer: Managed Care, Other (non HMO) | Admitting: Physician Assistant

## 2023-10-14 DIAGNOSIS — L89610 Pressure ulcer of right heel, unstageable: Secondary | ICD-10-CM | POA: Diagnosis not present

## 2023-10-21 ENCOUNTER — Ambulatory Visit: Payer: Managed Care, Other (non HMO) | Admitting: Physician Assistant
